# Patient Record
Sex: Female | Born: 1955 | Race: Black or African American | Hispanic: No | Marital: Single | Smoking: Current some day smoker
Health system: Southern US, Community
[De-identification: ages and names within clinical notes are randomized; demographics above are authoritative.]

## PROBLEM LIST (undated history)

## (undated) DIAGNOSIS — Z5189 Encounter for other specified aftercare: Secondary | ICD-10-CM

## (undated) DIAGNOSIS — F329 Major depressive disorder, single episode, unspecified: Secondary | ICD-10-CM

## (undated) DIAGNOSIS — I10 Essential (primary) hypertension: Secondary | ICD-10-CM

## (undated) DIAGNOSIS — E785 Hyperlipidemia, unspecified: Secondary | ICD-10-CM

## (undated) DIAGNOSIS — N189 Chronic kidney disease, unspecified: Secondary | ICD-10-CM

## (undated) DIAGNOSIS — E119 Type 2 diabetes mellitus without complications: Secondary | ICD-10-CM

## (undated) DIAGNOSIS — F419 Anxiety disorder, unspecified: Secondary | ICD-10-CM

## (undated) DIAGNOSIS — F32A Depression, unspecified: Secondary | ICD-10-CM

## (undated) DIAGNOSIS — T7840XA Allergy, unspecified, initial encounter: Secondary | ICD-10-CM

## (undated) DIAGNOSIS — J45909 Unspecified asthma, uncomplicated: Secondary | ICD-10-CM

## (undated) DIAGNOSIS — D509 Iron deficiency anemia, unspecified: Secondary | ICD-10-CM

## (undated) DIAGNOSIS — I639 Cerebral infarction, unspecified: Secondary | ICD-10-CM

## (undated) DIAGNOSIS — J449 Chronic obstructive pulmonary disease, unspecified: Secondary | ICD-10-CM

## (undated) DIAGNOSIS — E559 Vitamin D deficiency, unspecified: Secondary | ICD-10-CM

## (undated) HISTORY — DX: Iron deficiency anemia, unspecified: D50.9

## (undated) HISTORY — DX: Hyperlipidemia, unspecified: E78.5

## (undated) HISTORY — DX: Major depressive disorder, single episode, unspecified: F32.9

## (undated) HISTORY — DX: Anxiety disorder, unspecified: F41.9

## (undated) HISTORY — DX: Cerebral infarction, unspecified: I63.9

## (undated) HISTORY — DX: Vitamin D deficiency, unspecified: E55.9

## (undated) HISTORY — DX: Allergy, unspecified, initial encounter: T78.40XA

## (undated) HISTORY — DX: Unspecified asthma, uncomplicated: J45.909

## (undated) HISTORY — PX: APPENDECTOMY: SHX54

## (undated) HISTORY — DX: Chronic obstructive pulmonary disease, unspecified: J44.9

## (undated) HISTORY — DX: Depression, unspecified: F32.A

## (undated) HISTORY — PX: TUBAL LIGATION: SHX77

## (undated) HISTORY — PX: ABDOMINAL HYSTERECTOMY: SHX81

## (undated) HISTORY — DX: Chronic kidney disease, unspecified: N18.9

## (undated) HISTORY — DX: Type 2 diabetes mellitus without complications: E11.9

## (undated) HISTORY — DX: Essential (primary) hypertension: I10

## (undated) HISTORY — DX: Encounter for other specified aftercare: Z51.89

---

## 1998-01-24 ENCOUNTER — Emergency Department (HOSPITAL_COMMUNITY): Admission: EM | Admit: 1998-01-24 | Discharge: 1998-01-24 | Payer: Self-pay | Admitting: Emergency Medicine

## 1998-08-18 ENCOUNTER — Other Ambulatory Visit: Admission: RE | Admit: 1998-08-18 | Discharge: 1998-08-18 | Payer: Self-pay | Admitting: Family Medicine

## 1998-08-18 DIAGNOSIS — N63 Unspecified lump in unspecified breast: Secondary | ICD-10-CM | POA: Insufficient documentation

## 1998-11-23 ENCOUNTER — Emergency Department (HOSPITAL_COMMUNITY): Admission: EM | Admit: 1998-11-23 | Discharge: 1998-11-24 | Payer: Self-pay | Admitting: *Deleted

## 1999-01-11 ENCOUNTER — Ambulatory Visit (HOSPITAL_COMMUNITY): Admission: RE | Admit: 1999-01-11 | Discharge: 1999-01-11 | Payer: Self-pay | Admitting: Family Medicine

## 1999-01-11 ENCOUNTER — Encounter: Payer: Self-pay | Admitting: Family Medicine

## 1999-02-08 ENCOUNTER — Inpatient Hospital Stay (HOSPITAL_COMMUNITY): Admission: RE | Admit: 1999-02-08 | Discharge: 1999-02-11 | Payer: Self-pay | Admitting: Obstetrics and Gynecology

## 1999-02-08 ENCOUNTER — Encounter (INDEPENDENT_AMBULATORY_CARE_PROVIDER_SITE_OTHER): Payer: Self-pay | Admitting: *Deleted

## 1999-03-31 ENCOUNTER — Emergency Department (HOSPITAL_COMMUNITY): Admission: EM | Admit: 1999-03-31 | Discharge: 1999-04-01 | Payer: Self-pay | Admitting: *Deleted

## 1999-04-01 ENCOUNTER — Encounter: Payer: Self-pay | Admitting: Emergency Medicine

## 1999-09-21 ENCOUNTER — Ambulatory Visit (HOSPITAL_COMMUNITY): Admission: RE | Admit: 1999-09-21 | Discharge: 1999-09-21 | Payer: Self-pay | Admitting: *Deleted

## 1999-11-09 ENCOUNTER — Ambulatory Visit (HOSPITAL_BASED_OUTPATIENT_CLINIC_OR_DEPARTMENT_OTHER): Admission: RE | Admit: 1999-11-09 | Discharge: 1999-11-09 | Payer: Self-pay | Admitting: Family Medicine

## 1999-11-09 DIAGNOSIS — G4730 Sleep apnea, unspecified: Secondary | ICD-10-CM

## 1999-11-09 DIAGNOSIS — G4710 Hypersomnia, unspecified: Secondary | ICD-10-CM | POA: Insufficient documentation

## 1999-12-07 ENCOUNTER — Emergency Department (HOSPITAL_COMMUNITY): Admission: EM | Admit: 1999-12-07 | Discharge: 1999-12-07 | Payer: Self-pay | Admitting: Emergency Medicine

## 1999-12-14 ENCOUNTER — Ambulatory Visit (HOSPITAL_COMMUNITY): Admission: RE | Admit: 1999-12-14 | Discharge: 1999-12-14 | Payer: Self-pay | Admitting: Family Medicine

## 1999-12-14 ENCOUNTER — Encounter: Payer: Self-pay | Admitting: Family Medicine

## 2000-01-18 ENCOUNTER — Other Ambulatory Visit: Admission: RE | Admit: 2000-01-18 | Discharge: 2000-01-18 | Payer: Self-pay | Admitting: Obstetrics and Gynecology

## 2000-01-20 ENCOUNTER — Encounter: Payer: Self-pay | Admitting: Obstetrics and Gynecology

## 2000-01-20 ENCOUNTER — Ambulatory Visit (HOSPITAL_COMMUNITY): Admission: RE | Admit: 2000-01-20 | Discharge: 2000-01-20 | Payer: Self-pay | Admitting: Obstetrics and Gynecology

## 2000-03-29 ENCOUNTER — Ambulatory Visit (HOSPITAL_BASED_OUTPATIENT_CLINIC_OR_DEPARTMENT_OTHER): Admission: RE | Admit: 2000-03-29 | Discharge: 2000-03-29 | Payer: Self-pay | Admitting: Family Medicine

## 2001-01-17 ENCOUNTER — Encounter: Payer: Self-pay | Admitting: Family Medicine

## 2001-01-17 ENCOUNTER — Ambulatory Visit (HOSPITAL_COMMUNITY): Admission: RE | Admit: 2001-01-17 | Discharge: 2001-01-17 | Payer: Self-pay | Admitting: Family Medicine

## 2001-07-09 ENCOUNTER — Ambulatory Visit (HOSPITAL_BASED_OUTPATIENT_CLINIC_OR_DEPARTMENT_OTHER): Admission: RE | Admit: 2001-07-09 | Discharge: 2001-07-09 | Payer: Self-pay | Admitting: Pulmonary Disease

## 2001-10-25 ENCOUNTER — Encounter (INDEPENDENT_AMBULATORY_CARE_PROVIDER_SITE_OTHER): Payer: Self-pay | Admitting: Family Medicine

## 2001-10-25 LAB — CONVERTED CEMR LAB

## 2002-08-07 ENCOUNTER — Ambulatory Visit (HOSPITAL_COMMUNITY): Admission: RE | Admit: 2002-08-07 | Discharge: 2002-08-07 | Payer: Self-pay | Admitting: Family Medicine

## 2003-08-08 ENCOUNTER — Ambulatory Visit (HOSPITAL_COMMUNITY): Admission: RE | Admit: 2003-08-08 | Discharge: 2003-08-08 | Payer: Self-pay | Admitting: Family Medicine

## 2004-03-15 ENCOUNTER — Ambulatory Visit: Payer: Self-pay | Admitting: Family Medicine

## 2004-04-20 ENCOUNTER — Ambulatory Visit: Payer: Self-pay | Admitting: Family Medicine

## 2004-05-10 ENCOUNTER — Ambulatory Visit: Payer: Self-pay | Admitting: Family Medicine

## 2004-08-21 DIAGNOSIS — R279 Unspecified lack of coordination: Secondary | ICD-10-CM

## 2004-08-25 ENCOUNTER — Ambulatory Visit: Payer: Self-pay | Admitting: Family Medicine

## 2004-09-14 ENCOUNTER — Ambulatory Visit (HOSPITAL_COMMUNITY): Admission: RE | Admit: 2004-09-14 | Discharge: 2004-09-14 | Payer: Self-pay | Admitting: Family Medicine

## 2004-09-28 ENCOUNTER — Ambulatory Visit: Payer: Self-pay | Admitting: Family Medicine

## 2004-10-06 ENCOUNTER — Ambulatory Visit (HOSPITAL_COMMUNITY): Admission: RE | Admit: 2004-10-06 | Discharge: 2004-10-06 | Payer: Self-pay | Admitting: Family Medicine

## 2004-10-06 DIAGNOSIS — I6350 Cerebral infarction due to unspecified occlusion or stenosis of unspecified cerebral artery: Secondary | ICD-10-CM | POA: Insufficient documentation

## 2004-10-20 ENCOUNTER — Ambulatory Visit: Payer: Self-pay | Admitting: Family Medicine

## 2004-10-20 DIAGNOSIS — N393 Stress incontinence (female) (male): Secondary | ICD-10-CM | POA: Insufficient documentation

## 2004-12-13 ENCOUNTER — Encounter: Admission: RE | Admit: 2004-12-13 | Discharge: 2005-03-13 | Payer: Self-pay | Admitting: Neurology

## 2004-12-21 ENCOUNTER — Ambulatory Visit: Payer: Self-pay | Admitting: Family Medicine

## 2004-12-21 DIAGNOSIS — E785 Hyperlipidemia, unspecified: Secondary | ICD-10-CM | POA: Insufficient documentation

## 2005-03-17 ENCOUNTER — Ambulatory Visit: Payer: Self-pay | Admitting: Family Medicine

## 2005-05-25 ENCOUNTER — Ambulatory Visit: Payer: Self-pay | Admitting: Family Medicine

## 2005-10-24 ENCOUNTER — Ambulatory Visit: Payer: Self-pay | Admitting: Family Medicine

## 2005-11-01 ENCOUNTER — Ambulatory Visit (HOSPITAL_COMMUNITY): Admission: RE | Admit: 2005-11-01 | Discharge: 2005-11-01 | Payer: Self-pay | Admitting: Internal Medicine

## 2006-01-17 ENCOUNTER — Emergency Department (HOSPITAL_COMMUNITY): Admission: EM | Admit: 2006-01-17 | Discharge: 2006-01-17 | Payer: Self-pay | Admitting: Emergency Medicine

## 2006-01-24 ENCOUNTER — Ambulatory Visit: Payer: Self-pay | Admitting: Internal Medicine

## 2006-02-27 ENCOUNTER — Ambulatory Visit: Payer: Self-pay | Admitting: Family Medicine

## 2006-05-08 ENCOUNTER — Ambulatory Visit: Payer: Self-pay | Admitting: Family Medicine

## 2006-05-08 LAB — CONVERTED CEMR LAB
HDL: 43 mg/dL
Microalbumin U total vol: 0.51 mg/L
Triglycerides: 82 mg/dL

## 2006-05-29 ENCOUNTER — Ambulatory Visit: Payer: Self-pay | Admitting: Family Medicine

## 2006-11-02 ENCOUNTER — Encounter (INDEPENDENT_AMBULATORY_CARE_PROVIDER_SITE_OTHER): Payer: Self-pay | Admitting: Family Medicine

## 2006-11-02 DIAGNOSIS — L732 Hidradenitis suppurativa: Secondary | ICD-10-CM | POA: Insufficient documentation

## 2006-11-02 DIAGNOSIS — F17200 Nicotine dependence, unspecified, uncomplicated: Secondary | ICD-10-CM | POA: Insufficient documentation

## 2006-11-02 DIAGNOSIS — F70 Mild intellectual disabilities: Secondary | ICD-10-CM

## 2006-11-02 DIAGNOSIS — F200 Paranoid schizophrenia: Secondary | ICD-10-CM | POA: Insufficient documentation

## 2006-11-02 DIAGNOSIS — E119 Type 2 diabetes mellitus without complications: Secondary | ICD-10-CM | POA: Insufficient documentation

## 2006-11-02 DIAGNOSIS — J45909 Unspecified asthma, uncomplicated: Secondary | ICD-10-CM | POA: Insufficient documentation

## 2006-11-02 HISTORY — DX: Mild intellectual disabilities: F70

## 2006-11-22 ENCOUNTER — Ambulatory Visit (HOSPITAL_COMMUNITY): Admission: RE | Admit: 2006-11-22 | Discharge: 2006-11-22 | Payer: Self-pay | Admitting: Family Medicine

## 2006-11-30 ENCOUNTER — Encounter: Admission: RE | Admit: 2006-11-30 | Discharge: 2006-11-30 | Payer: Self-pay | Admitting: Family Medicine

## 2007-01-03 ENCOUNTER — Ambulatory Visit: Payer: Self-pay | Admitting: Family Medicine

## 2007-01-05 ENCOUNTER — Ambulatory Visit: Payer: Self-pay | Admitting: Family Medicine

## 2007-01-30 ENCOUNTER — Telehealth (INDEPENDENT_AMBULATORY_CARE_PROVIDER_SITE_OTHER): Payer: Self-pay | Admitting: Family Medicine

## 2007-02-21 ENCOUNTER — Encounter (INDEPENDENT_AMBULATORY_CARE_PROVIDER_SITE_OTHER): Payer: Self-pay | Admitting: Family Medicine

## 2007-02-26 ENCOUNTER — Telehealth (INDEPENDENT_AMBULATORY_CARE_PROVIDER_SITE_OTHER): Payer: Self-pay | Admitting: Family Medicine

## 2007-03-15 ENCOUNTER — Encounter (INDEPENDENT_AMBULATORY_CARE_PROVIDER_SITE_OTHER): Payer: Self-pay | Admitting: Family Medicine

## 2007-03-19 ENCOUNTER — Telehealth (INDEPENDENT_AMBULATORY_CARE_PROVIDER_SITE_OTHER): Payer: Self-pay | Admitting: Family Medicine

## 2007-05-02 ENCOUNTER — Emergency Department (HOSPITAL_COMMUNITY): Admission: EM | Admit: 2007-05-02 | Discharge: 2007-05-02 | Payer: Self-pay | Admitting: Family Medicine

## 2007-05-11 ENCOUNTER — Encounter (INDEPENDENT_AMBULATORY_CARE_PROVIDER_SITE_OTHER): Payer: Self-pay | Admitting: Family Medicine

## 2007-05-15 ENCOUNTER — Telehealth (INDEPENDENT_AMBULATORY_CARE_PROVIDER_SITE_OTHER): Payer: Self-pay | Admitting: Family Medicine

## 2007-06-19 ENCOUNTER — Telehealth (INDEPENDENT_AMBULATORY_CARE_PROVIDER_SITE_OTHER): Payer: Self-pay | Admitting: Family Medicine

## 2007-07-09 ENCOUNTER — Ambulatory Visit: Payer: Self-pay | Admitting: Family Medicine

## 2007-07-09 LAB — CONVERTED CEMR LAB
ALT: 11 units/L (ref 0–35)
AST: 13 units/L (ref 0–37)
Basophils Absolute: 0 10*3/uL (ref 0.0–0.1)
Calcium: 9.4 mg/dL (ref 8.4–10.5)
Chloride: 101 meq/L (ref 96–112)
Creatinine, Ser: 0.66 mg/dL (ref 0.40–1.20)
Eosinophils Relative: 1 % (ref 0–5)
Glucose, Bld: 134 mg/dL — ABNORMAL HIGH (ref 70–99)
HCT: 37.6 % (ref 36.0–46.0)
HDL: 41 mg/dL (ref >39)
Hemoglobin: 12.4 g/dL (ref 12.0–15.0)
LDL Cholesterol: 80 mg/dL (ref 0–99)
Lymphocytes Relative: 28 % (ref 12–46)
Monocytes Absolute: 1 10*3/uL (ref 0.1–1.0)
Platelets: 314 10*3/uL (ref 150–400)
Potassium: 4.1 meq/L (ref 3.5–5.3)
RBC: 3.99 M/uL (ref 3.87–5.11)
Sodium: 139 meq/L (ref 135–145)
TSH: 0.453 microintl units/mL (ref 0.350–5.50)
Total CHOL/HDL Ratio: 3.3
Triglycerides: 78 mg/dL (ref <150)
WBC: 8.6 10*3/uL (ref 4.0–10.5)

## 2007-08-01 ENCOUNTER — Telehealth (INDEPENDENT_AMBULATORY_CARE_PROVIDER_SITE_OTHER): Payer: Self-pay | Admitting: Family Medicine

## 2007-08-02 ENCOUNTER — Encounter (INDEPENDENT_AMBULATORY_CARE_PROVIDER_SITE_OTHER): Payer: Self-pay | Admitting: Family Medicine

## 2007-08-02 ENCOUNTER — Telehealth (INDEPENDENT_AMBULATORY_CARE_PROVIDER_SITE_OTHER): Payer: Self-pay | Admitting: *Deleted

## 2007-08-07 ENCOUNTER — Ambulatory Visit: Payer: Self-pay | Admitting: Family Medicine

## 2007-08-09 ENCOUNTER — Ambulatory Visit: Payer: Self-pay | Admitting: Family Medicine

## 2007-08-16 ENCOUNTER — Telehealth (INDEPENDENT_AMBULATORY_CARE_PROVIDER_SITE_OTHER): Payer: Self-pay | Admitting: *Deleted

## 2007-08-27 ENCOUNTER — Ambulatory Visit: Payer: Self-pay | Admitting: Family Medicine

## 2007-08-27 LAB — CONVERTED CEMR LAB: Blood Glucose, Fingerstick: 138

## 2007-09-11 ENCOUNTER — Ambulatory Visit: Payer: Self-pay | Admitting: Family Medicine

## 2007-09-12 ENCOUNTER — Encounter (INDEPENDENT_AMBULATORY_CARE_PROVIDER_SITE_OTHER): Payer: Self-pay | Admitting: Family Medicine

## 2007-09-14 ENCOUNTER — Telehealth (INDEPENDENT_AMBULATORY_CARE_PROVIDER_SITE_OTHER): Payer: Self-pay | Admitting: Nurse Practitioner

## 2007-09-14 ENCOUNTER — Ambulatory Visit: Payer: Self-pay | Admitting: Family Medicine

## 2007-09-20 ENCOUNTER — Telehealth (INDEPENDENT_AMBULATORY_CARE_PROVIDER_SITE_OTHER): Payer: Self-pay | Admitting: Family Medicine

## 2007-09-26 ENCOUNTER — Encounter (INDEPENDENT_AMBULATORY_CARE_PROVIDER_SITE_OTHER): Payer: Self-pay | Admitting: Family Medicine

## 2007-10-03 ENCOUNTER — Encounter (INDEPENDENT_AMBULATORY_CARE_PROVIDER_SITE_OTHER): Payer: Self-pay | Admitting: Family Medicine

## 2007-12-06 ENCOUNTER — Encounter (INDEPENDENT_AMBULATORY_CARE_PROVIDER_SITE_OTHER): Payer: Self-pay | Admitting: Internal Medicine

## 2007-12-25 ENCOUNTER — Ambulatory Visit (HOSPITAL_COMMUNITY): Admission: RE | Admit: 2007-12-25 | Discharge: 2007-12-25 | Payer: Self-pay | Admitting: Family Medicine

## 2008-01-17 ENCOUNTER — Encounter (INDEPENDENT_AMBULATORY_CARE_PROVIDER_SITE_OTHER): Payer: Self-pay | Admitting: Family Medicine

## 2008-02-07 ENCOUNTER — Telehealth (INDEPENDENT_AMBULATORY_CARE_PROVIDER_SITE_OTHER): Payer: Self-pay | Admitting: Family Medicine

## 2008-02-13 ENCOUNTER — Encounter (INDEPENDENT_AMBULATORY_CARE_PROVIDER_SITE_OTHER): Payer: Self-pay | Admitting: Family Medicine

## 2008-04-04 ENCOUNTER — Telehealth (INDEPENDENT_AMBULATORY_CARE_PROVIDER_SITE_OTHER): Payer: Self-pay | Admitting: Internal Medicine

## 2008-05-18 ENCOUNTER — Telehealth (INDEPENDENT_AMBULATORY_CARE_PROVIDER_SITE_OTHER): Payer: Self-pay | Admitting: Internal Medicine

## 2008-05-23 ENCOUNTER — Encounter (INDEPENDENT_AMBULATORY_CARE_PROVIDER_SITE_OTHER): Payer: Self-pay | Admitting: Family Medicine

## 2008-05-23 ENCOUNTER — Telehealth (INDEPENDENT_AMBULATORY_CARE_PROVIDER_SITE_OTHER): Payer: Self-pay | Admitting: Family Medicine

## 2008-06-13 ENCOUNTER — Encounter (INDEPENDENT_AMBULATORY_CARE_PROVIDER_SITE_OTHER): Payer: Self-pay | Admitting: Family Medicine

## 2008-07-15 ENCOUNTER — Telehealth (INDEPENDENT_AMBULATORY_CARE_PROVIDER_SITE_OTHER): Payer: Self-pay | Admitting: Family Medicine

## 2008-09-09 ENCOUNTER — Ambulatory Visit: Payer: Self-pay | Admitting: Family Medicine

## 2008-09-09 LAB — CONVERTED CEMR LAB
Bilirubin Urine: NEGATIVE
Blood in Urine, dipstick: NEGATIVE
Hgb A1c MFr Bld: 6 %
Nitrite: NEGATIVE
Specific Gravity, Urine: 1.015
WBC Urine, dipstick: NEGATIVE
pH: 5.5

## 2008-09-17 ENCOUNTER — Encounter (INDEPENDENT_AMBULATORY_CARE_PROVIDER_SITE_OTHER): Payer: Self-pay | Admitting: Family Medicine

## 2008-09-18 ENCOUNTER — Encounter (INDEPENDENT_AMBULATORY_CARE_PROVIDER_SITE_OTHER): Payer: Self-pay | Admitting: Family Medicine

## 2008-09-26 DIAGNOSIS — I10 Essential (primary) hypertension: Secondary | ICD-10-CM | POA: Insufficient documentation

## 2008-09-30 ENCOUNTER — Ambulatory Visit: Payer: Self-pay | Admitting: Family Medicine

## 2008-09-30 LAB — CONVERTED CEMR LAB
Albumin: 3.9 g/dL (ref 3.5–5.2)
Alkaline Phosphatase: 58 units/L (ref 39–117)
BUN: 14 mg/dL (ref 6–23)
Basophils Absolute: 0 10*3/uL (ref 0.0–0.1)
Basophils Relative: 0 % (ref 0–1)
Calcium: 9.4 mg/dL (ref 8.4–10.5)
Creatinine, Ser: 0.66 mg/dL (ref 0.40–1.20)
Glucose, Bld: 99 mg/dL (ref 70–99)
HDL: 47 mg/dL (ref >39)
LDL Cholesterol: 80 mg/dL (ref 0–99)
Lymphs Abs: 2.4 10*3/uL (ref 0.7–4.0)
MCHC: 32 g/dL (ref 30.0–36.0)
Monocytes Absolute: 0.8 10*3/uL (ref 0.1–1.0)
Neutrophils Relative %: 54 % (ref 43–77)
Potassium: 5 meq/L (ref 3.5–5.3)
RBC: 4 M/uL (ref 3.87–5.11)
RDW: 14.4 % (ref 11.5–15.5)
TSH: 0.781 microintl units/mL (ref 0.350–4.500)
Total CHOL/HDL Ratio: 3.1
Total Protein: 6.5 g/dL (ref 6.0–8.3)
Triglycerides: 90 mg/dL (ref <150)
VLDL: 18 mg/dL (ref 0–40)
WBC: 7.1 10*3/uL (ref 4.0–10.5)

## 2008-10-01 ENCOUNTER — Encounter (INDEPENDENT_AMBULATORY_CARE_PROVIDER_SITE_OTHER): Payer: Self-pay | Admitting: Family Medicine

## 2008-10-06 ENCOUNTER — Encounter (INDEPENDENT_AMBULATORY_CARE_PROVIDER_SITE_OTHER): Payer: Self-pay | Admitting: Nurse Practitioner

## 2008-10-09 ENCOUNTER — Encounter (INDEPENDENT_AMBULATORY_CARE_PROVIDER_SITE_OTHER): Payer: Self-pay | Admitting: Family Medicine

## 2008-10-15 ENCOUNTER — Encounter: Admission: RE | Admit: 2008-10-15 | Discharge: 2008-10-15 | Payer: Self-pay | Admitting: Neurology

## 2008-10-20 ENCOUNTER — Encounter: Admission: RE | Admit: 2008-10-20 | Discharge: 2008-12-08 | Payer: Self-pay | Admitting: Neurology

## 2008-10-22 ENCOUNTER — Encounter: Admission: RE | Admit: 2008-10-22 | Discharge: 2008-10-22 | Payer: Self-pay | Admitting: Orthopedic Surgery

## 2008-10-22 ENCOUNTER — Telehealth (INDEPENDENT_AMBULATORY_CARE_PROVIDER_SITE_OTHER): Payer: Self-pay | Admitting: Family Medicine

## 2008-11-28 ENCOUNTER — Telehealth (INDEPENDENT_AMBULATORY_CARE_PROVIDER_SITE_OTHER): Payer: Self-pay | Admitting: Internal Medicine

## 2008-12-02 ENCOUNTER — Encounter (INDEPENDENT_AMBULATORY_CARE_PROVIDER_SITE_OTHER): Payer: Self-pay | Admitting: Internal Medicine

## 2008-12-04 ENCOUNTER — Encounter (INDEPENDENT_AMBULATORY_CARE_PROVIDER_SITE_OTHER): Payer: Self-pay | Admitting: Family Medicine

## 2008-12-19 ENCOUNTER — Ambulatory Visit: Payer: Self-pay | Admitting: Physician Assistant

## 2008-12-19 DIAGNOSIS — W57XXXA Bitten or stung by nonvenomous insect and other nonvenomous arthropods, initial encounter: Secondary | ICD-10-CM | POA: Insufficient documentation

## 2008-12-19 DIAGNOSIS — T148 Other injury of unspecified body region: Secondary | ICD-10-CM

## 2008-12-22 ENCOUNTER — Ambulatory Visit: Payer: Self-pay | Admitting: Nurse Practitioner

## 2008-12-22 LAB — CONVERTED CEMR LAB: Blood Glucose, Fingerstick: 120

## 2008-12-24 ENCOUNTER — Telehealth (INDEPENDENT_AMBULATORY_CARE_PROVIDER_SITE_OTHER): Payer: Self-pay | Admitting: Nurse Practitioner

## 2008-12-31 ENCOUNTER — Telehealth (INDEPENDENT_AMBULATORY_CARE_PROVIDER_SITE_OTHER): Payer: Self-pay | Admitting: Nurse Practitioner

## 2009-01-06 ENCOUNTER — Telehealth (INDEPENDENT_AMBULATORY_CARE_PROVIDER_SITE_OTHER): Payer: Self-pay | Admitting: Nurse Practitioner

## 2009-01-16 ENCOUNTER — Encounter (INDEPENDENT_AMBULATORY_CARE_PROVIDER_SITE_OTHER): Payer: Self-pay | Admitting: *Deleted

## 2009-01-20 ENCOUNTER — Ambulatory Visit (HOSPITAL_COMMUNITY): Admission: RE | Admit: 2009-01-20 | Discharge: 2009-01-20 | Payer: Self-pay | Admitting: Family Medicine

## 2009-01-23 ENCOUNTER — Encounter (INDEPENDENT_AMBULATORY_CARE_PROVIDER_SITE_OTHER): Payer: Self-pay | Admitting: Nurse Practitioner

## 2009-01-30 ENCOUNTER — Encounter: Admission: RE | Admit: 2009-01-30 | Discharge: 2009-01-30 | Payer: Self-pay | Admitting: Family Medicine

## 2009-02-03 ENCOUNTER — Encounter (INDEPENDENT_AMBULATORY_CARE_PROVIDER_SITE_OTHER): Payer: Self-pay | Admitting: Nurse Practitioner

## 2009-03-30 ENCOUNTER — Telehealth (INDEPENDENT_AMBULATORY_CARE_PROVIDER_SITE_OTHER): Payer: Self-pay | Admitting: *Deleted

## 2009-03-31 ENCOUNTER — Encounter (INDEPENDENT_AMBULATORY_CARE_PROVIDER_SITE_OTHER): Payer: Self-pay | Admitting: Nurse Practitioner

## 2009-05-06 ENCOUNTER — Telehealth (INDEPENDENT_AMBULATORY_CARE_PROVIDER_SITE_OTHER): Payer: Self-pay | Admitting: Nurse Practitioner

## 2009-05-18 ENCOUNTER — Ambulatory Visit: Payer: Self-pay | Admitting: Nurse Practitioner

## 2009-07-28 ENCOUNTER — Telehealth (INDEPENDENT_AMBULATORY_CARE_PROVIDER_SITE_OTHER): Payer: Self-pay | Admitting: Nurse Practitioner

## 2009-08-11 ENCOUNTER — Encounter (INDEPENDENT_AMBULATORY_CARE_PROVIDER_SITE_OTHER): Payer: Self-pay | Admitting: Nurse Practitioner

## 2009-08-25 ENCOUNTER — Telehealth (INDEPENDENT_AMBULATORY_CARE_PROVIDER_SITE_OTHER): Payer: Self-pay | Admitting: Nurse Practitioner

## 2009-09-01 ENCOUNTER — Inpatient Hospital Stay (HOSPITAL_COMMUNITY): Admission: EM | Admit: 2009-09-01 | Discharge: 2009-09-03 | Payer: Self-pay | Admitting: Emergency Medicine

## 2009-09-01 ENCOUNTER — Ambulatory Visit: Payer: Self-pay | Admitting: Cardiovascular Disease

## 2009-09-02 ENCOUNTER — Ambulatory Visit: Payer: Self-pay | Admitting: Vascular Surgery

## 2009-09-02 ENCOUNTER — Encounter (INDEPENDENT_AMBULATORY_CARE_PROVIDER_SITE_OTHER): Payer: Self-pay | Admitting: Internal Medicine

## 2009-09-03 ENCOUNTER — Encounter (INDEPENDENT_AMBULATORY_CARE_PROVIDER_SITE_OTHER): Payer: Self-pay | Admitting: Nurse Practitioner

## 2009-09-09 ENCOUNTER — Other Ambulatory Visit: Admission: RE | Admit: 2009-09-09 | Discharge: 2009-09-09 | Payer: Self-pay | Admitting: Internal Medicine

## 2009-09-09 ENCOUNTER — Telehealth (INDEPENDENT_AMBULATORY_CARE_PROVIDER_SITE_OTHER): Payer: Self-pay | Admitting: Nurse Practitioner

## 2009-09-09 ENCOUNTER — Ambulatory Visit: Payer: Self-pay | Admitting: Nurse Practitioner

## 2009-09-09 DIAGNOSIS — E8941 Symptomatic postprocedural ovarian failure: Secondary | ICD-10-CM | POA: Insufficient documentation

## 2009-09-09 LAB — CONVERTED CEMR LAB
GC Probe Amp, Genital: NEGATIVE
LDL Goal: 70 mg/dL

## 2009-09-10 ENCOUNTER — Ambulatory Visit: Payer: Self-pay | Admitting: Nurse Practitioner

## 2009-09-14 ENCOUNTER — Encounter (INDEPENDENT_AMBULATORY_CARE_PROVIDER_SITE_OTHER): Payer: Self-pay | Admitting: Nurse Practitioner

## 2009-09-14 LAB — CONVERTED CEMR LAB
ALT: 9 units/L (ref 0–35)
CO2: 29 meq/L (ref 19–32)
Calcium: 9.4 mg/dL (ref 8.4–10.5)
Chloride: 101 meq/L (ref 96–112)
Eosinophils Absolute: 0.1 10*3/uL (ref 0.0–0.7)
Hemoglobin: 11.9 g/dL — ABNORMAL LOW (ref 12.0–15.0)
Lymphs Abs: 2.7 10*3/uL (ref 0.7–4.0)
MCV: 92.1 fL (ref 78.0–100.0)
Monocytes Relative: 12 % (ref 3–12)
Neutro Abs: 3 10*3/uL (ref 1.7–7.7)
Neutrophils Relative %: 45 % (ref 43–77)
RBC: 3.91 M/uL (ref 3.87–5.11)
Sodium: 141 meq/L (ref 135–145)
TSH: 0.442 microintl units/mL (ref 0.350–4.500)
Total CHOL/HDL Ratio: 3.3
Total Protein: 6.6 g/dL (ref 6.0–8.3)
VLDL: 17 mg/dL (ref 0–40)
WBC: 6.6 10*3/uL (ref 4.0–10.5)

## 2009-09-15 ENCOUNTER — Encounter (INDEPENDENT_AMBULATORY_CARE_PROVIDER_SITE_OTHER): Payer: Self-pay | Admitting: Nurse Practitioner

## 2009-09-17 ENCOUNTER — Telehealth (INDEPENDENT_AMBULATORY_CARE_PROVIDER_SITE_OTHER): Payer: Self-pay | Admitting: Nurse Practitioner

## 2009-09-18 ENCOUNTER — Telehealth (INDEPENDENT_AMBULATORY_CARE_PROVIDER_SITE_OTHER): Payer: Self-pay | Admitting: Nurse Practitioner

## 2009-09-23 ENCOUNTER — Encounter (INDEPENDENT_AMBULATORY_CARE_PROVIDER_SITE_OTHER): Payer: Self-pay | Admitting: Nurse Practitioner

## 2009-10-01 ENCOUNTER — Telehealth (INDEPENDENT_AMBULATORY_CARE_PROVIDER_SITE_OTHER): Payer: Self-pay | Admitting: Nurse Practitioner

## 2009-10-01 LAB — CONVERTED CEMR LAB
Bilirubin Urine: NEGATIVE
Glucose, Urine, Semiquant: NEGATIVE

## 2009-10-12 ENCOUNTER — Telehealth (INDEPENDENT_AMBULATORY_CARE_PROVIDER_SITE_OTHER): Payer: Self-pay | Admitting: Nurse Practitioner

## 2009-10-20 ENCOUNTER — Encounter (INDEPENDENT_AMBULATORY_CARE_PROVIDER_SITE_OTHER): Payer: Self-pay | Admitting: Nurse Practitioner

## 2009-10-22 ENCOUNTER — Telehealth (INDEPENDENT_AMBULATORY_CARE_PROVIDER_SITE_OTHER): Payer: Self-pay | Admitting: Nurse Practitioner

## 2009-10-22 ENCOUNTER — Emergency Department (HOSPITAL_COMMUNITY): Admission: EM | Admit: 2009-10-22 | Discharge: 2009-10-22 | Payer: Self-pay | Admitting: Emergency Medicine

## 2009-10-30 ENCOUNTER — Encounter (INDEPENDENT_AMBULATORY_CARE_PROVIDER_SITE_OTHER): Payer: Self-pay | Admitting: Nurse Practitioner

## 2009-11-12 ENCOUNTER — Encounter (INDEPENDENT_AMBULATORY_CARE_PROVIDER_SITE_OTHER): Payer: Self-pay | Admitting: Nurse Practitioner

## 2009-11-17 ENCOUNTER — Encounter (INDEPENDENT_AMBULATORY_CARE_PROVIDER_SITE_OTHER): Payer: Self-pay | Admitting: Nurse Practitioner

## 2009-12-18 ENCOUNTER — Encounter (INDEPENDENT_AMBULATORY_CARE_PROVIDER_SITE_OTHER): Payer: Self-pay | Admitting: Nurse Practitioner

## 2009-12-21 ENCOUNTER — Encounter (INDEPENDENT_AMBULATORY_CARE_PROVIDER_SITE_OTHER): Payer: Self-pay | Admitting: Nurse Practitioner

## 2009-12-22 ENCOUNTER — Encounter (INDEPENDENT_AMBULATORY_CARE_PROVIDER_SITE_OTHER): Payer: Self-pay | Admitting: Nurse Practitioner

## 2010-02-02 ENCOUNTER — Telehealth (INDEPENDENT_AMBULATORY_CARE_PROVIDER_SITE_OTHER): Payer: Self-pay | Admitting: Nurse Practitioner

## 2010-03-10 ENCOUNTER — Ambulatory Visit: Payer: Self-pay | Admitting: Nurse Practitioner

## 2010-03-16 ENCOUNTER — Telehealth (INDEPENDENT_AMBULATORY_CARE_PROVIDER_SITE_OTHER): Payer: Self-pay | Admitting: Nurse Practitioner

## 2010-04-09 ENCOUNTER — Telehealth (INDEPENDENT_AMBULATORY_CARE_PROVIDER_SITE_OTHER): Payer: Self-pay | Admitting: Nurse Practitioner

## 2010-04-27 ENCOUNTER — Telehealth (INDEPENDENT_AMBULATORY_CARE_PROVIDER_SITE_OTHER): Payer: Self-pay | Admitting: Nurse Practitioner

## 2010-05-11 IMAGING — CT CT HEAD W/O CM
1 series · 16 of 30 positions shown, 20 images · non-contrast
Comparison: CT dated 01/17/2006 and MR dated 10/15/2008.

CLINICAL DATA: Left arm weakness.  Altered mental status.  Fell
several times today.

CT HEAD WITHOUT CONTRAST
TECHNIQUE: Contiguous axial images were obtained from the base of
the skull through the vertex without contrast.

[Series 2: head_seq 4.5 h37s st · axial · 0.43mm/px · z∈[-148,-22]mm · 16 of 32 slices shown, 20 images]
[im 2/32  brain]
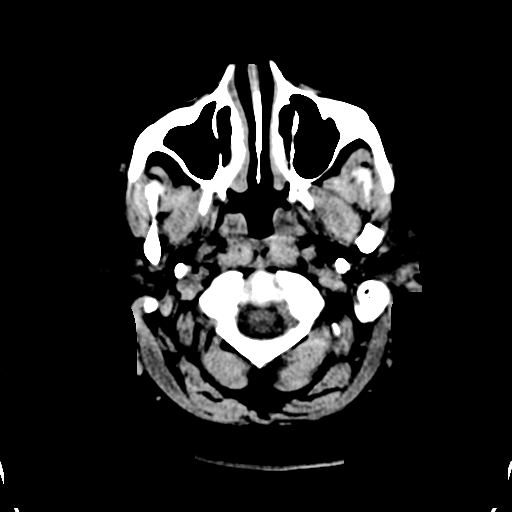
[im 2/32  bone]
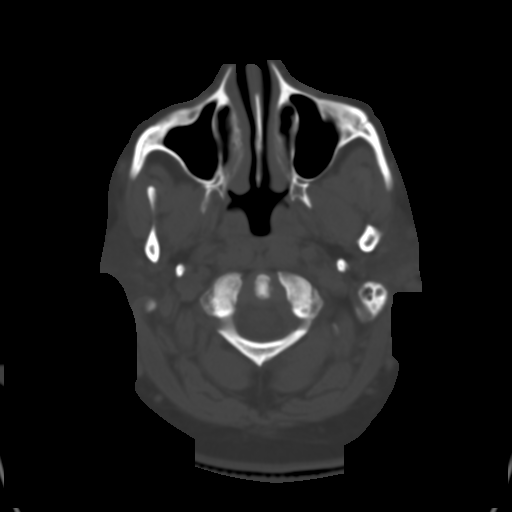
[im 4/32  brain]
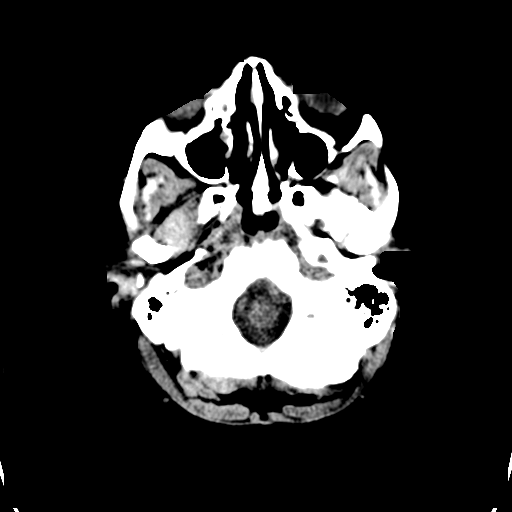
[im 6/32  brain]
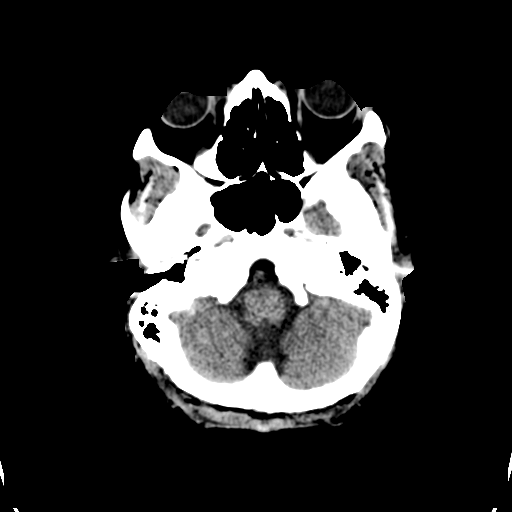
[im 8/32  brain]
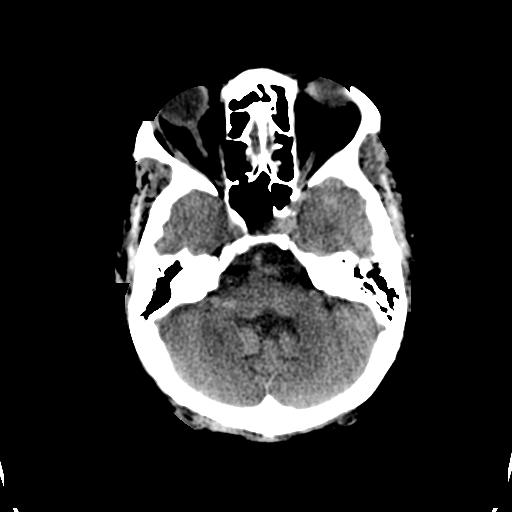
[im 9/32  brain]
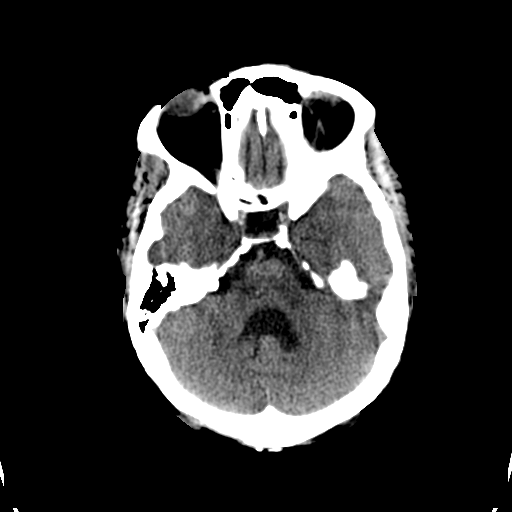
[im 9/32  bone]
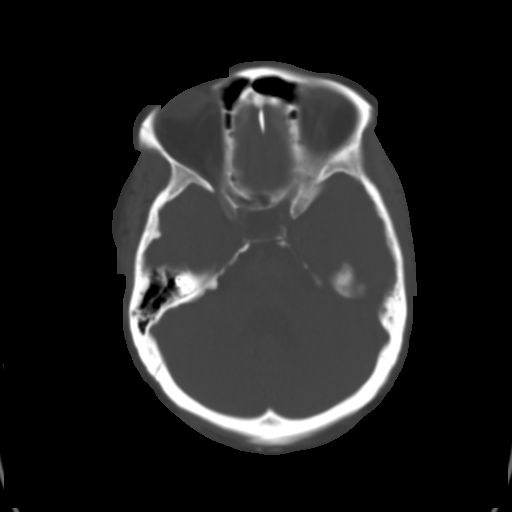
[im 11/32  brain]
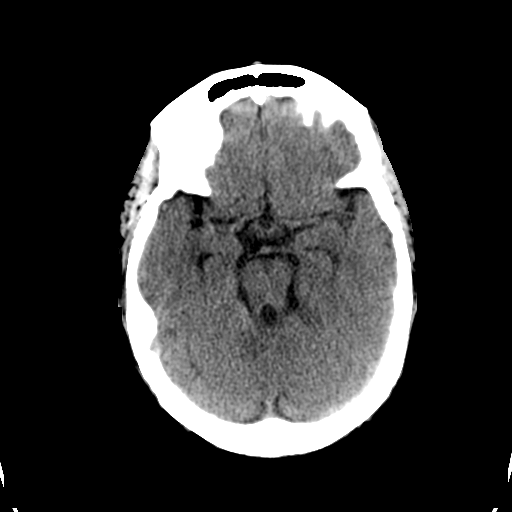
[im 13/32  brain]
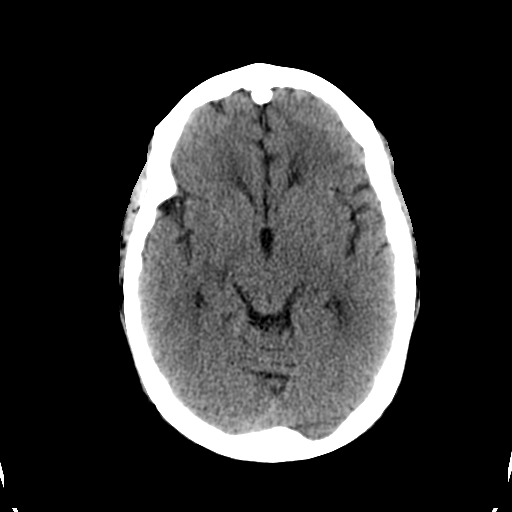
[im 15/32  brain]
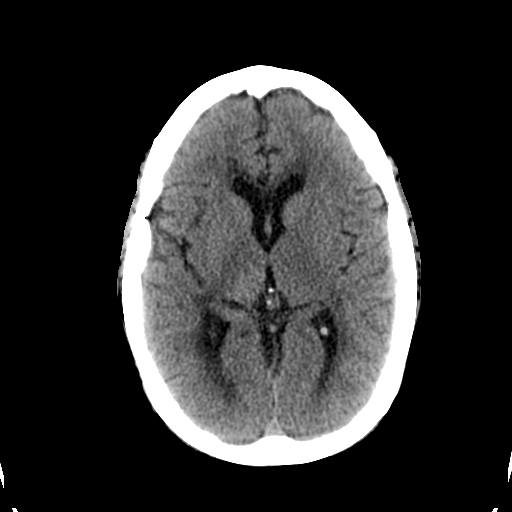
[im 17/32  brain]
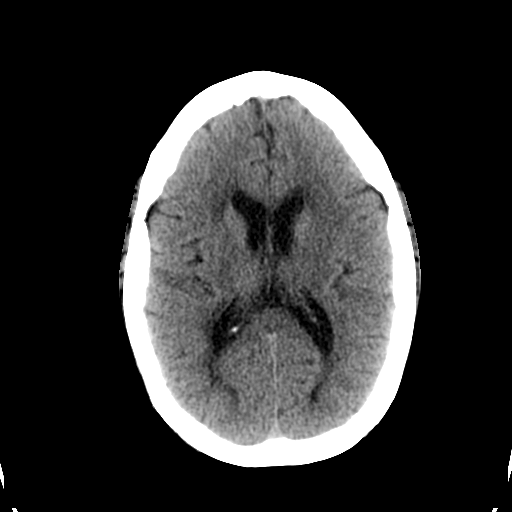
[im 17/32  bone]
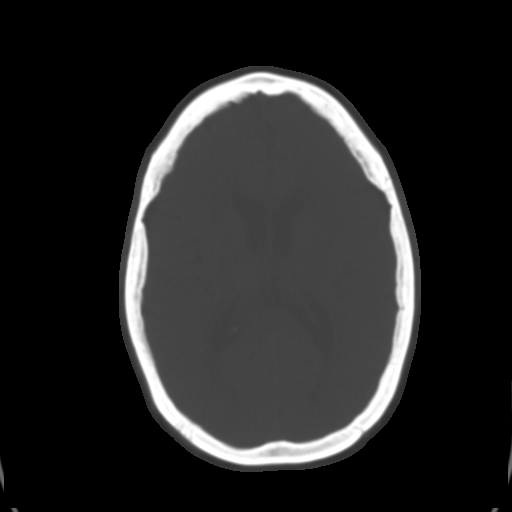
[im 19/32  brain]
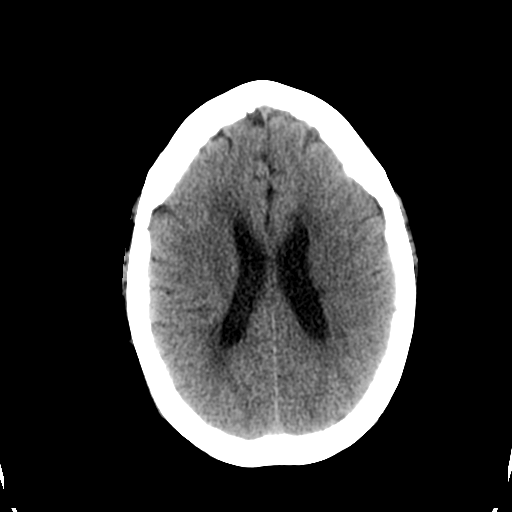
[im 21/32  brain]
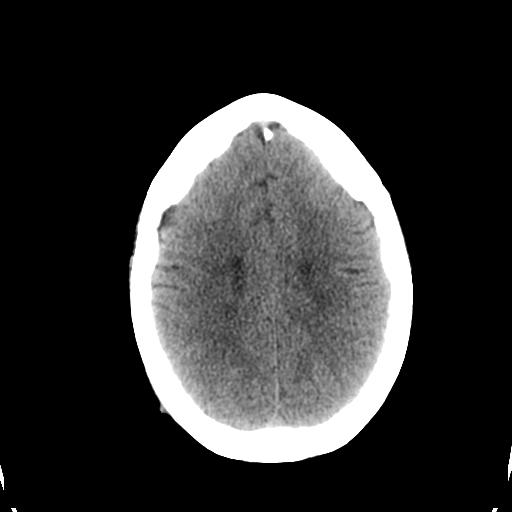
[im 23/32  brain]
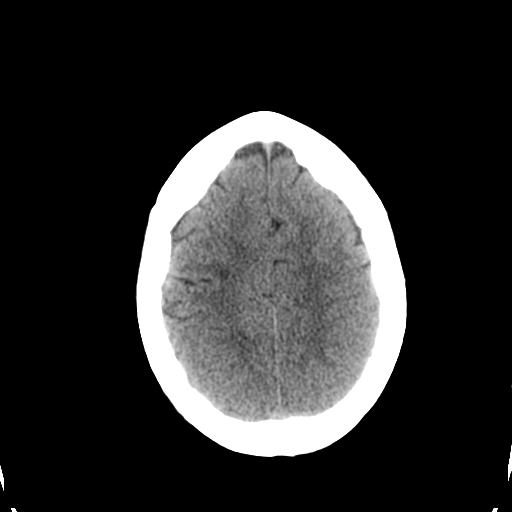
[im 24/32  brain]
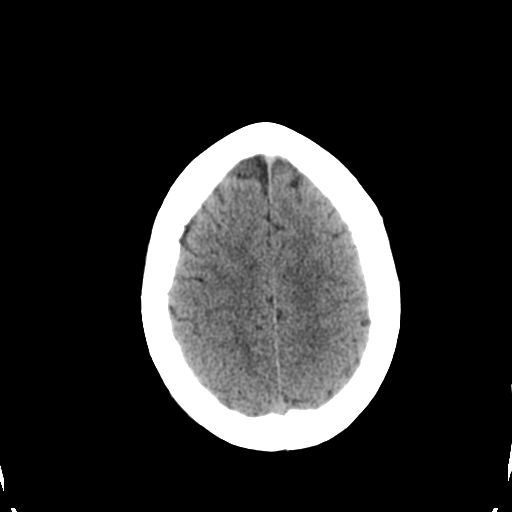
[im 24/32  bone]
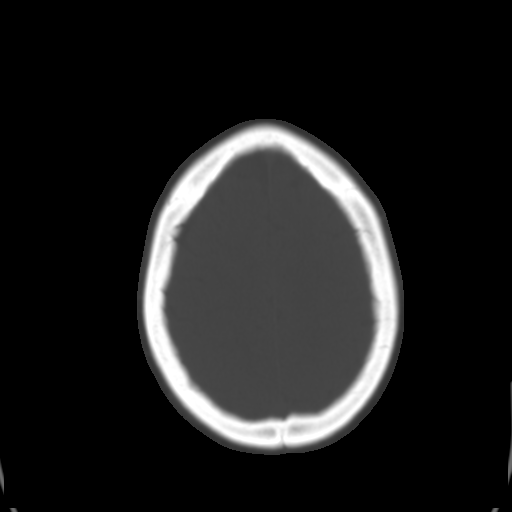
[im 26/32  brain]
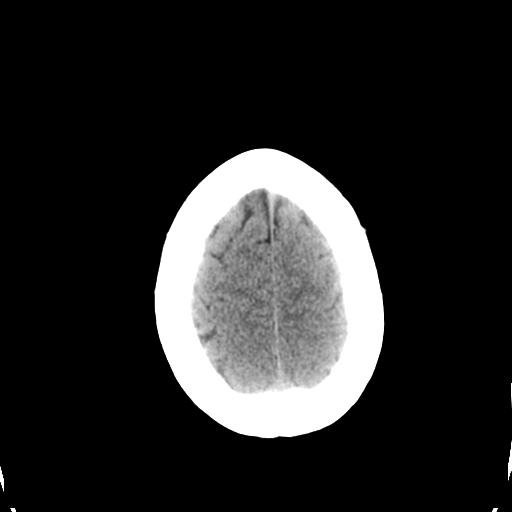
[im 28/32  brain]
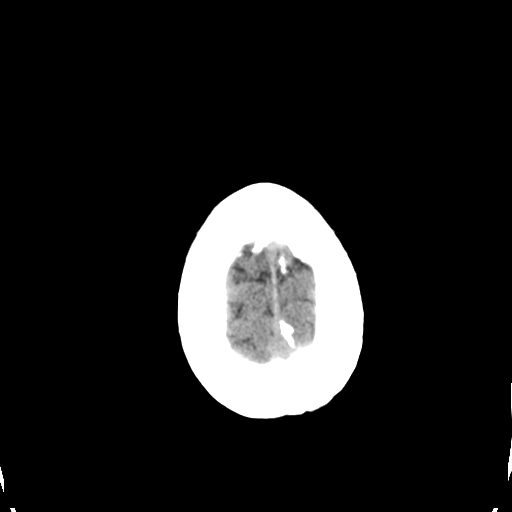
[im 30/32  brain]
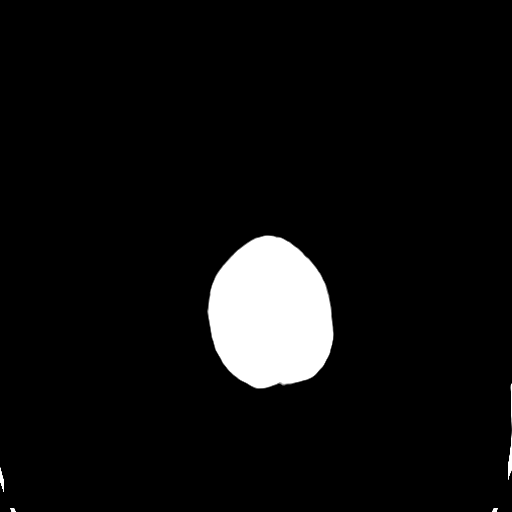

[16 of 30 positions shown; findings below may reference images not displayed]

FINDINGS: Old left midbrain and bilateral pontomesencephalic
lacunar infarcts.  No significant change in patchy white matter low
density in both cerebral hemispheres.  Minimally enlarged
ventricles and subarachnoid spaces.  No skull fracture,
intracranial hemorrhage, mass lesion or CT evidence of acute
infarction.  Unremarkable bones and included paranasal sinuses.
IMPRESSION: 1.  Old brainstem lacunar infarcts and chronic small vessel white
matter ischemic changes in both cerebral hemispheres.
2.  Minimal diffuse cerebral and cerebellar atrophy.
3.  No acute abnormality.

## 2010-05-23 ENCOUNTER — Encounter: Payer: Self-pay | Admitting: Internal Medicine

## 2010-06-01 NOTE — Letter (Signed)
Summary: DURABLE MEDICAL EQUIPMENT  DURABLE MEDICAL EQUIPMENT   Imported By: Arta Bruce 09/23/2009 10:09:31  _____________________________________________________________________  External Attachment:    Type:   Image     Comment:   External Document

## 2010-06-01 NOTE — Assessment & Plan Note (Signed)
Summary: FLU SHOT////KT  Nurse Visit   Allergies: 1)  ! Penicillin 2)  ! * Mellaril  Immunizations Administered:  Influenza Vaccine # 1:    Vaccine Type: Fluvax 3+    Site: right deltoid    Mfr: [employer]    Dose: 0.5 ml    Route: IM    Given by: [place] Shannon    Exp. Date: 10/30/2010    Lot #: [id number]    VIS given: 11/24/09 version given March 10, 2010.  Flu Vaccine Consent Questions:    Do you have a history of severe allergic reactions to this vaccine? no    Any prior history of allergic reactions to egg and/or gelatin? no    Do you have a sensitivity to the preservative Thimersol? no    Do you have a past history of Guillan-Barre Syndrome? no    Do you currently have an acute febrile illness? no    Have you ever had a severe reaction to latex? no    Vaccine information given and explained to patient? yes    Are you currently pregnant? no  Orders Added: 1)  Flu Vaccine 6yrs + [90658] 2)  Admin 1st Vaccine [90471] 3)  Est. Patient Nurse visit [09003]

## 2010-06-01 NOTE — Miscellaneous (Signed)
Summary: Med changes  Clinical Lists Changes  Medications: Removed medication of VESICARE 10 MG  TABS (SOLIFENACIN SUCCINATE) Take 1 tablet by mouth once a day Added new medication of FLONASE 50 MCG/ACT SUSP (FLUTICASONE PROPIONATE) Two sprays in each nostril Added new medication of ENABLEX 15 MG XR24H-TAB (DARIFENACIN HYDROBROMIDE) One tablet by mouth daily Added new medication of FLUPHENAZINE HCL 5 MG TABS (FLUPHENAZINE HCL) ONe tablet by mouth nightly

## 2010-06-01 NOTE — Progress Notes (Signed)
Summary: Contact pt  Phone Note Call from Patient Call back at Home Phone [phone] Call back at [phone]   Summary of Call: Since last two days, the pt has a bad cold (coughing and sneezing constantly) and she wants to make sure if the provider can call in something to the pharmacy.  [facility]) Daphine Deutscher FNP Initial call taken by: Manon Hilding,  May 06, 2009 12:16 PM  Follow-up for Phone Call        schedule an appt. Follow-up by: Mikey College CMA,  May 07, 2009 3:56 PM  Additional Follow-up for Phone Call Additional follow up Details #1::        Yetzali IS SCHED. TO COME IN ON NEXT THURSDAY ON 01/13. Additional Follow-up by: Leodis Rains,  May 08, 2009 3:26 PM

## 2010-06-01 NOTE — Progress Notes (Signed)
Summary: Needs ref to go to Dr. Sandria Manly  Phone Note Call from Patient Call back at Home Phone [phone]   Caller: [facility] at [facility] Reason for Call: Referral Summary of Call: [name] pt. Brenda Riggs called from [facility] and says that a call was made from someone to get Brenda Riggs an appt. to see Dr. Barbaraann Barthel who used to work here. Brenda Riggs says that she got a message back from [place], that Dr. Barbaraann Barthel says that we need to handle getting Brenda Riggs referral and that she is not taking any new medicaid patients.  Well Brenda Riggs says that Brenda Riggs has seen Dr. Sandria Manly her Neurologist last year, so she needs another referral to go back to Dr. Sandria Manly  Initial call taken by: Leodis Rains,  August 25, 2009 5:17 PM  Follow-up for Phone Call        forward to N.Daphine Deutscher, FNP Follow-up by: Levon Hedger,  August 26, 2009 3:12 PM  Additional Follow-up for Phone Call Additional follow up Details #1::        WHAT! Call Brenda Riggs and get clarity on this - why would they call Dr. Barbaraann Barthel - she no longer works her and she knows this. Where they hoping to get pt transferred to [employer] where she works now? She has a hx of CVA - is this why she sees Dr.Love?   Additional Follow-up by: Lehman Prom FNP,  August 26, 2009 3:36 PM    Additional Follow-up for Phone Call Additional follow up Details #2::    PT IS SCHEDULED FOR OV WITH PROVIDER ON 09/10/09 @ 11:30 Follow-up by: Levon Hedger,  August 27, 2009 12:21 PM

## 2010-06-01 NOTE — Letter (Signed)
[facility]: [facility]   Imported By: Arta Bruce 11/13/2009 14:22:29  _____________________________________________________________________  External Attachment:    Type:   Image     Comment:   External Document

## 2010-06-01 NOTE — Assessment & Plan Note (Signed)
Summary: Asthma/HTN   Vital Signs:  Patient profile:   55 year old female Menstrual status:  hysterectomy Height:      59.45 inches Weight:      155.3 pounds BMI:     31.01 Pulse rate:   99 / minute Pulse rhythm:   regular Resp:     18 per minute BP sitting:   175 / 91  (left arm) Cuff size:   regular  Vitals Entered By: Arthor Captain (May 18, 2009 12:07 PM) CC: cough, cold Pain Assessment Patient in pain? yes     Location: chest Intensity: 2 Type: dull Onset of pain  with activity, hurts with coughing CBG Result 195 CBG Device ID b  Does patient need assistance? Functional Status Self care Ambulation Normal Comments requests flu vax   CC:  cough and cold.  History of Present Illness:  Pt into the office for f/u on cough and congestion.  Tobacco use = ongoing for years.  She is now smoking about 1/2 pack to 1 pack per day. Caretaker says is has been an ongoing effort for this pt to quit smoking. Pt is from a rest home and has to go outside to smoke. Cough worse at time caretaker made the appt. it has since improved -fever -change in appetite   Asthma - Pt using Bipap at night Also has a MDI that she uses frequently  Allergies (verified): 1)  ! Penicillin 2)  ! * Mellaril  Review of Systems CV:  Denies chest pain or discomfort. Resp:  Complains of cough and shortness of breath; c/o chest discomfort x 1 day. GI:  Denies abdominal pain, nausea, and vomiting.  Physical Exam  General:  alert.   Head:  normocephalic.   Lungs:  audible breathing pattern but with auscultation no rhonchi decreased bases Heart:  normal rate and regular rhythm.   Msk:  up to the exam table Neurologic:  alert & oriented X3.     Impression & Recommendations:  Problem # 1:  ASTHMA (ICD-493.90)  Her updated medication list for this problem includes:    Ventolin Hfa 108 (90 Base) Mcg/act Aers (Albuterol sulfate) .Marland Kitchen... 2 puffs q4h as needed wheeze    Qvar 40 Mcg/act  Aers (Beclomethasone dipropionate) .Marland Kitchen... 2 puffs inhalation two times a day  Orders: Pulse Oximetry (single measurment) (60454)  Problem # 2:  TOBACCO USER (ICD-305.1)  Advised pt that she needs to quit smoking this will only progress current lung problems  Orders: Pulse Oximetry (single measurment) (94760)  Problem # 3:  ESSENTIAL HYPERTENSION, BENIGN (ICD-401.1) Elevated today most likely will need to adjust meds f/u in 2 weeks for recheck d/c allegra D Her updated medication list for this problem includes:    Hydrochlorothiazide 25 Mg Tabs (Hydrochlorothiazide) .Marland Kitchen... Take 1 tablet by mouth every morning    Lisinopril 20 Mg Tabs (Lisinopril) .Marland Kitchen... 2 tablets by mouth daily for blood pressure  Problem # 4:  NEED PROPHYLACTIC VACCINATION&INOCULATION FLU (ICD-V04.81)  indication: htn/diabetes  Orders: Administration Flu vaccine - MCR (G0008) Flu Vaccine 70yrs + (09811)  Complete Medication List: 1)  Humulin 70/30 70-30 % Susp (Insulin isophane & regular) .Marland Kitchen.. 15 units subcutaneously q am 2)  Glucophage 500 Mg Tabs (Metformin hcl) .... Take 1 tablet two times a day 3)  Minocin 50 Mg Caps (Minocycline hcl) .... Take 1 tablet by mouth two times a day 4)  Hydrochlorothiazide 25 Mg Tabs (Hydrochlorothiazide) .... Take 1 tablet by mouth every morning 5)  Tylenol 325  Mg Tabs (Acetaminophen) .... Take one to two tablets by mouth every four hoursas needed pain 6)  Cetaphil Crea (Emollient) .... To affected areas daily 7)  Prolixin 25mg  Per Ml  .... 2cc im every 2 weeks per dr. Hortencia Pilar 8)  Ativan 2 Mg Tabs (Lorazepam) .... Take 2 tablets po at bedtime 9)  Vesicare 10 Mg Tabs (Solifenacin succinate) .... Take 1 tablet by mouth once a day 10)  Lipitor 10 Mg Tabs (Atorvastatin calcium) .... Take 1 tablet by mouth every pm 11)  Lisinopril 20 Mg Tabs (Lisinopril) .... 2 tablets by mouth daily for blood pressure 12)  Replacement Bipap At 14/10 With Heated Humidity  .... Also with mask of  choice and supplies dx=obstructive sleep apnea fax to [phone] attn:andy 13)  Bedside Commode  .... Use bedside commode for patient safety 14)  Prolixin 5mg   .... Take one tablet by mouth at bedtime (per [facility]) 15)  Bedside Commode  16)  Aspirin Ec 81 Mg Tbec (Aspirin) .... Take 1 tablet by mouth once a day 17)  Ventolin Hfa 108 (90 Base) Mcg/act Aers (Albuterol sulfate) .... 2 puffs q4h as needed wheeze 18)  Flonase 50 Mcg/act Susp (Fluticasone propionate) .... Use 2 squirts to each nostril once daily 19)  Benztropine Mesylate 1 Mg Tabs (Benztropine mesylate) .... Take 1/2 tablet by mouth two times a day .  dr. love and bartlett. 20)  Naprosyn 250 Mg Tabs (Naproxen) .... Take 1 tablet by mouth every 12 hours as needed for pain unrelieved by tylenol 21)  Benadryl 25 Mg Caps (Diphenhydramine hcl) .Marland Kitchen.. 1 tablet by mouth every 6-8 hours as needed for itching 22)  Qvar 40 Mcg/act Aers (Beclomethasone dipropionate) .... 2 puffs inhalation two times a day 23)  Tessalon Perles 100 Mg Caps (Benzonatate) .... One capsule by mouth two times a day as needed for cough  Other Orders: Hemoglobin A1C (83036) Capillary Blood Glucose/CBG (45409)  Patient Instructions: 1)  Blood pressure is high. 2)  Continue blood pressure medication 3)  Discontinue Allegra D if this is on the medication list at the rest home 4)  Breathing - start qvar - 2 puffs two times a day for breathing. 5)  May take albuterol inhaler as needed  6)  You have recieved a flu vaccine today. 7)  Follow up in 2 weeks for a blood pressure check.  Take your medications before this visit. Prescriptions: TESSALON PERLES 100 MG CAPS (BENZONATATE) One capsule by mouth two times a day as needed for cough  #50 x 0   Entered and Authorized by:   Lehman Prom FNP   Signed by:   Lehman Prom FNP on 05/18/2009   Method used:   Print then Give to Patient   RxID:   [id number] QVAR 40 MCG/ACT AERS (BECLOMETHASONE DIPROPIONATE) 2 puffs  inhalation two times a day  #1 month qs x 5   Entered and Authorized by:   Lehman Prom FNP   Signed by:   Lehman Prom FNP on 05/18/2009   Method used:   Print then Give to Patient   RxID:   [id number]   Laboratory Results   Blood Tests   Date/Time Received: May 18, 2009 12:07 PM  Date/Time Reported: May 18, 2009 12:08 PM   HGBA1C: 6.3%   (Normal Range: Non-Diabetic - 3-6%   Control Diabetic - 6-8%) CBG Random:: 195      Influenza Vaccine    Vaccine Type: Fluvax 3+    Mfr: [employer]  Dose: 0.5 ml    Route: IM    Given by: Levon Hedger    Exp. Date: 10/29/2009    Lot #: [id number]    VIS given: 11/23/06 version given May 18, 2009.  Flu Vaccine Consent Questions    Do you have a history of severe allergic reactions to this vaccine? no    Any prior history of allergic reactions to egg and/or gelatin? no    Do you have a sensitivity to the preservative Thimersol? no    Do you have a past history of Guillan-Barre Syndrome? no    Do you currently have an acute febrile illness? no    Have you ever had a severe reaction to latex? no    Vaccine information given and explained to patient? yes    Are you currently pregnant? no  Appended Document: Asthma/HTN     Vital Signs:  Patient profile:   55 year old female Menstrual status:  hysterectomy O2 Sat:      100 % on Room air  O2 Flow:  Room air  Allergies: 1)  ! Penicillin 2)  ! * Mellaril   Complete Medication List: 1)  Humulin 70/30 70-30 % Susp (Insulin isophane & regular) .Marland Kitchen.. 15 units subcutaneously q am 2)  Glucophage 500 Mg Tabs (Metformin hcl) .... Take 1 tablet two times a day 3)  Minocin 50 Mg Caps (Minocycline hcl) .... Take 1 tablet by mouth two times a day 4)  Hydrochlorothiazide 25 Mg Tabs (Hydrochlorothiazide) .... Take 1 tablet by mouth every morning 5)  Tylenol 325 Mg Tabs (Acetaminophen) .... Take one to two tablets by mouth every four hoursas needed pain 6)   Cetaphil Crea (Emollient) .... To affected areas daily 7)  Prolixin 25mg  Per Ml  .... 2cc im every 2 weeks per dr. Hortencia Pilar 8)  Ativan 2 Mg Tabs (Lorazepam) .... Take 2 tablets po at bedtime 9)  Vesicare 10 Mg Tabs (Solifenacin succinate) .... Take 1 tablet by mouth once a day 10)  Lipitor 10 Mg Tabs (Atorvastatin calcium) .... Take 1 tablet by mouth every pm 11)  Lisinopril 20 Mg Tabs (Lisinopril) .... 2 tablets by mouth daily for blood pressure 12)  Replacement Bipap At 14/10 With Heated Humidity  .... Also with mask of choice and supplies dx=obstructive sleep apnea fax to [phone] attn:andy 13)  Bedside Commode  .... Use bedside commode for patient safety 14)  Prolixin 5mg   .... Take one tablet by mouth at bedtime (per [facility]) 15)  Bedside Commode  16)  Aspirin Ec 81 Mg Tbec (Aspirin) .... Take 1 tablet by mouth once a day 17)  Ventolin Hfa 108 (90 Base) Mcg/act Aers (Albuterol sulfate) .... 2 puffs q4h as needed wheeze 18)  Flonase 50 Mcg/act Susp (Fluticasone propionate) .... Use 2 squirts to each nostril once daily 19)  Benztropine Mesylate 1 Mg Tabs (Benztropine mesylate) .... Take 1/2 tablet by mouth two times a day .  dr. love and bartlett. 20)  Naprosyn 250 Mg Tabs (Naproxen) .... Take 1 tablet by mouth every 12 hours as needed for pain unrelieved by tylenol 21)  Benadryl 25 Mg Caps (Diphenhydramine hcl) .Marland Kitchen.. 1 tablet by mouth every 6-8 hours as needed for itching 22)  Qvar 40 Mcg/act Aers (Beclomethasone dipropionate) .... 2 puffs inhalation two times a day 23)  Tessalon Perles 100 Mg Caps (Benzonatate) .... One capsule by mouth two times a day as needed for cough      Vital Signs:  Patient profile:  55 year old female Menstrual status:  hysterectomy O2 Sat:      100 %  O2 Flow:  Room air   Serial Vital Signs/Assessments:                                PEF    PreRx  PostRx Time      O2 Sat  O2 Type     L/min  L/min  L/min   By           100 %   Room air                           Levon Hedger

## 2010-06-01 NOTE — Miscellaneous (Signed)
Summary: [facility]/FAXED  [facility]   Imported By: Arta Bruce 12/22/2009 09:52:27  _____________________________________________________________________  External Attachment:    Type:   Image     Comment:   External Document

## 2010-06-01 NOTE — Miscellaneous (Signed)
Summary: Medication change  Clinical Lists Changes  Medications: Removed medication of FLONASE 50 MCG/ACT SUSP (FLUTICASONE PROPIONATE) Use 2 squirts to each nostril once daily

## 2010-06-01 NOTE — Medication Information (Signed)
Summary: Tax adviser   Imported By: Arta Bruce 12/21/2009 08:48:06  _____________________________________________________________________  External Attachment:    Type:   Image     Comment:   External Document

## 2010-06-01 NOTE — Letter (Signed)
Summary: DRUG REVIEW  DRUG REVIEW   Imported By: Arta Bruce 08/11/2009 09:15:29  _____________________________________________________________________  External Attachment:    Type:   Image     Comment:   External Document

## 2010-06-01 NOTE — Progress Notes (Signed)
Summary: Refill Request [facility])  Phone Note From Pharmacy   Summary of Call: [facility] called in because they just faxed manually a prescription request for the pt above.  The pt needs refills from [facility].   They wanted to know if that can be done today because the pharmacy deliver her medication on Thursday.  If you have any questions, please call at  [phone]  to speak with Meriam Sprague) Daphine Deutscher FNP Initial call taken by: Manon Hilding,  Sep 17, 2009 10:31 AM  Follow-up for Phone Call        pt has been receiving med from urologist. Meriam Sprague contacted she will call his office for refill  Follow-up by: Gaylyn Cheers RN Sep 17, 2009 11:05 PM

## 2010-06-01 NOTE — Progress Notes (Signed)
Summary: Query:  Refill simvastatin?  Phone Note Outgoing Call   Summary of Call: Do you want to refill her simvastatin?  Last seen 08/2009.  Dutch Quint RN  February 02, 2010 3:33 PM   Follow-up for Phone Call        yes, ok to refill Follow-up by: Lehman Prom FNP,  February 03, 2010 8:07 AM  Additional Follow-up for Phone Call Additional follow up Details #1::        Noted.  Refill completed. Additional Follow-up by: Dutch Quint RN,  February 03, 2010 9:32 AM    Prescriptions: SIMVASTATIN 10 MG TABS (SIMVASTATIN) One tablet by mouth nightly for cholesterol **Pharmacy - d/c lipitor**  #30 x 3   Entered by:   Dutch Quint RN   Authorized by:   Lehman Prom FNP   Signed by:   Dutch Quint RN on 02/03/2010   Method used:   Electronically to        [facility]* (retail)       [address]       [place], [place]  [id number]       Ph: [phone]       Fax: [phone]   RxID:   [id number]

## 2010-06-01 NOTE — Assessment & Plan Note (Signed)
Summary: Complete Physical Exam   Vital Signs:  Patient profile:   55 year old female Menstrual status:  hysterectomy Weight:      149.7 pounds BMI:     29.89 BSA:     1.64 O2 Sat:      100 % on Room air Temp:     97.9 degrees F oral Pulse rate:   81 / minute Pulse rhythm:   regular Resp:     20 per minute BP sitting:   157 / 101  (left arm) Cuff size:   regular  Vitals Entered By: Levon Hedger (Sep 09, 2009 8:39 AM)  O2 Flow:  Room air CC: CPP, Hypertension Management, Lipid Management Is Patient Diabetic? Yes Pain Assessment Patient in pain? no      CBG Result 168 CBG Device ID A  Does patient need assistance? Functional Status Self care Ambulation Normal  Vision Screening:      Vision Comments: 01/2010   CC:  CPP, Hypertension Management, and Lipid Management.  History of Present Illness:   Pt into the office for a complete physical exam FL2 form needs completion  Recent hospitalization: 09/01/2009 to 09/03/2009 Small acute subcortical subcortical infarcts and Gait abnormalities Established with Dr. Sandria Manly (neurologist) but it has been over 1 year since her last visit metoprolol added to BP regimen  AHC: difficulty with meals s/p hospitalization.  Recommend swallow evaluation Form in office today with pt.  Tobacco abuse - ongoing although caregiver is trying to decrease the amount  PAP - s/p hysterectomy in 2002.  No PAP since that time.  Eye exam - last done 01/2009. no need for glasses. diabetes not affecting the eye  Dental - 01/07/2009 - time for visit  Podiatry - every 3 months by Dr. Ralene Cork  Hypertension History:      She denies headache, chest pain, and palpitations.  pt is checking blood pressure at least twice per day since hospital discharge 134/70 (sitting), 130/70 (standing).        Positive major cardiovascular risk factors include diabetes, hyperlipidemia, hypertension, and current tobacco user.  Negative major cardiovascular risk  factors include female age less than 15 years old.        Positive history for target organ damage include prior stroke (or TIA).  Further assessment for target organ damage reveals no history of peripheral vascular disease.    Lipid Management History:      Positive NCEP/ATP III risk factors include diabetes, current tobacco user, hypertension, and prior stroke (or TIA).  Negative NCEP/ATP III risk factors include female age less than 46 years old, no peripheral vascular disease, and no history of aortic aneurysm.        Comments include: pt is taking meds.  Comments: pt is NOT fasting today for labs.    Habits & Providers  Alcohol-Tobacco-Diet     Alcohol drinks/day: 0     Tobacco Status: current     Tobacco Counseling: to quit use of tobacco products     Cigarette Packs/Day: <0.25  Exercise-Depression-Behavior     Drug Use: no  Comments: PHQ-9: unable to complete  Diabetic Foot Exam Last Podiatry Exam Date: 01/30/2009  Foot Inspection Is there a history of a foot ulcer?              No Is there a foot ulcer now?              No Can the patient see the bottom of their feet?  Yes Are the shoes appropriate in style and fit?          No Is there swelling or an abnormal foot shape?          No Are the toenails long?                Yes Are the toenails thick?                Yes Are the toenails ingrown?              No Is there heavy callous build-up?              No Is there pain in the calf muscle (Intermittent claudication) when walking?    NoIs there a claw toe deformity?              No Is there elevated skin temperature?            No Is there limited ankle dorsiflexion?            No Is there foot or ankle muscle weakness?            No  Diabetic Foot Care Education Patient educated on appropriate care of diabetic feet.  Pulse Check          Right Foot          Left Foot Dorsalis Pedis:        normal            normal  High Risk Feet? No  Allergies  (verified): 1)  ! Penicillin 2)  ! * Mellaril  Social History: Packs/Day:  <0.25  Review of Systems General:  Denies loss of appetite. Eyes:  Denies discharge. ENT:  Complains of difficulty swallowing; ? coughing when eating and drinking since recent hospitalization. consistancy of foods does not matter. CV:  Denies chest pain or discomfort. Resp:  heavy breathing. GI:  Denies abdominal pain, nausea, and vomiting. GU:  Denies discharge. MS:  Complains of joint pain. Derm:  Denies rash. Neuro:  Denies headaches. Psych:  Denies anxiety and depression.  Physical Exam  General:  alert.   Head:  normocephalic.   Eyes:  pupils equal and pupils round.   Ears:  right ear with miminal cerumen left ear with clear fluid Nose:  no nasal discharge.   Mouth:  poor dentition.   Neck:  supple.   Chest Wall:  no deformities.   Breasts:  no abnormal thickening.   Lungs:  normal breath sounds.   Heart:  normal rate and regular rhythm.   Abdomen:  soft, non-tender, and normal bowel sounds.   Rectal:  no external abnormalities.   Msk:  active Extremities:  no edeme Neurologic:  somulent  Pelvic Exam  Vulva:      normal appearance.   Urethra and Bladder:      Urethra--no discharge.   Vagina:      physiologic discharge.   Cervix:      absent Adnexa:      nontender bilaterally.   Rectum:      normal, heme negative stool.    Diabetes Management Exam:    Eye Exam:       Eye Exam done elsewhere          Date: 02/02/2009          Results: normal          Done by: optho    Impression & Recommendations:  Problem #  1:  ROUTINE GYNECOLOGICAL EXAMINATION (ICD-V72.31) PHQ-9 score: unable to complete mammogram up to date PAP done EKG done optho up to date needs dental visit  Problem # 2:  DIABETES MELLITUS, TYPE II (ICD-250.00) stable Her updated medication list for this problem includes:    Humulin 70/30 70-30 % Susp (Insulin isophane & regular) .Marland KitchenMarland KitchenMarland KitchenMarland Kitchen 15 units  subcutaneously q am    Glucophage 500 Mg Tabs (Metformin hcl) .Marland Kitchen... Take 1 tablet two times a day    Lisinopril 20 Mg Tabs (Lisinopril) .Marland Kitchen... 2 tablets by mouth daily for blood pressure    Aspirin Ec 81 Mg Tbec (Aspirin) .Marland Kitchen... Take 1 tablet by mouth once a day  Orders: Capillary Blood Glucose/CBG (82948) Hemoglobin A1C (83036)  Problem # 3:  ESSENTIAL HYPERTENSION, BENIGN (ICD-401.1) still slightly elevated metoprolol just added in hospital Her updated medication list for this problem includes:    Hydrochlorothiazide 25 Mg Tabs (Hydrochlorothiazide) .Marland Kitchen... Take 1 tablet by mouth every morning    Lisinopril 20 Mg Tabs (Lisinopril) .Marland Kitchen... 2 tablets by mouth daily for blood pressure    Metoprolol Tartrate 50 Mg Tabs (Metoprolol tartrate) ..... One tablet by mouth two times a day  Orders: EKG w/ Interpretation (93000)  Problem # 4:  STROKE (ICD-434.91) will refer back to neurology recent hospitalization for subcortical CVA Her updated medication list for this problem includes:    Aspirin Ec 81 Mg Tbec (Aspirin) .Marland Kitchen... Take 1 tablet by mouth once a day  Orders: Neurology Referral (Neuro)  Complete Medication List: 1)  Humulin 70/30 70-30 % Susp (Insulin isophane & regular) .Marland Kitchen.. 15 units subcutaneously q am 2)  Glucophage 500 Mg Tabs (Metformin hcl) .... Take 1 tablet two times a day 3)  Minocin 50 Mg Caps (Minocycline hcl) .... Take 1 tablet by mouth two times a day 4)  Hydrochlorothiazide 25 Mg Tabs (Hydrochlorothiazide) .... Take 1 tablet by mouth every morning 5)  Tylenol 325 Mg Tabs (Acetaminophen) .... Take one to two tablets by mouth every four hoursas needed pain 6)  Cetaphil Crea (Emollient) .... To affected areas daily 7)  Prolixin 25mg  Per Ml  .... 2cc im every 2 weeks per dr. Hortencia Pilar 8)  Ativan 2 Mg Tabs (Lorazepam) .... Take 2 tablets po at bedtime 9)  Vesicare 10 Mg Tabs (Solifenacin succinate) .... Take 1 tablet by mouth once a day 10)  Simvastatin 10 Mg Tabs  (Simvastatin) .... One tablet by mouth nightly for cholesterol **pharmacy - d/c lipitor** 11)  Lisinopril 20 Mg Tabs (Lisinopril) .... 2 tablets by mouth daily for blood pressure 12)  Replacement Bipap At 14/10 With Heated Humidity  .... Also with mask of choice and supplies dx=obstructive sleep apnea fax to [phone] attn:andy 13)  Bedside Commode  .... Use bedside commode for patient safety 14)  Prolixin 5mg   .... Take one tablet by mouth at bedtime (per [facility]) 15)  Bedside Commode  16)  Aspirin Ec 81 Mg Tbec (Aspirin) .... Take 1 tablet by mouth once a day 17)  Ventolin Hfa 108 (90 Base) Mcg/act Aers (Albuterol sulfate) .... 2 puffs q4h as needed wheeze 18)  Flonase 50 Mcg/act Susp (Fluticasone propionate) .... Use 2 squirts to each nostril once daily 19)  Benztropine Mesylate 1 Mg Tabs (Benztropine mesylate) .... Take 1/2 tablet by mouth two times a day .  dr. love and bartlett. 20)  Naprosyn 250 Mg Tabs (Naproxen) .... Take 1 tablet by mouth every 12 hours as needed for pain unrelieved by tylenol 21)  Qvar 40  Mcg/act Aers (Beclomethasone dipropionate) .... 2 puffs inhalation two times a day 22)  Tessalon Perles 100 Mg Caps (Benzonatate) .... One capsule by mouth two times a day as needed for cough 23)  Metoprolol Tartrate 50 Mg Tabs (Metoprolol tartrate) .... One tablet by mouth two times a day  Diabetes Management Assessment/Plan:      The following lipid goals have been established for the patient: Total cholesterol goal of 200; LDL cholesterol goal of 70; HDL cholesterol goal of 40; Triglyceride goal of 150.  Her blood pressure goal is < 130/80.    Hypertension Assessment/Plan:      The patient's hypertensive risk group is category C: Target organ damage and/or diabetes.  Her calculated 10 year risk of coronary heart disease is 24 %.  Today's blood pressure is 157/101.  Her blood pressure goal is < 130/80.  Lipid Assessment/Plan:      Based on NCEP/ATP III, the patient's risk factor  category is "history of coronary disease, peripheral vascular disease, cerebrovascular disease, or aortic aneurysm along with either diabetes, current smoker, or LDL > 130 plus HDL < 40 plus triglycerides > 200".  The patient's lipid goals are as follows: Total cholesterol goal is 200; LDL cholesterol goal is 70; HDL cholesterol goal is 40; Triglyceride goal is 150.    Patient Instructions: 1)  Brenda Riggs will need to return to the office on either this tomorrow or Friday for fasting labs since she has already eaten today. 2)  No food after midnight before this visit. She can drink water only. 3)  Will need lipids, cmet, cbc, HIV, TSH, u/a, microalbumin  4)  (codes 250.00, 295,30, 272.4) 5)  Get FL2 co-signed by Dr. Everette Rank returns for labs 6)  Bring blood pressure log into the office (leave for [facility] to review next week)  Laboratory Results   Blood Tests   Date/Time Received: Sep 09, 2009 8:52 AM   HGBA1C: 6.2%   (Normal Range: Non-Diabetic - 3-6%   Control Diabetic - 6-8%) CBG Random:: 168       EKG  Procedure date:  09/09/2009  Findings:      NSR  X-ray  Procedure date:  09/03/2009  Findings:      left rib - no acute abnormality  CT Brain  Procedure date:  09/03/2009  Findings:      old brainstem infarct and chronic small vessel white matter ischemic changes in both cerebral hemisphers. impression was minimal diffuse cerebral and cerebellar atrophy. no acute abnormality  MRI Brain  Procedure date:  09/03/2009  Findings:      small acute subcortical white matter infarch, right posterior frontoparietal region, progressive atrophy and extensive small vessel disease  MRA Cerebral  Procedure date:  09/03/2009  Findings:      mild intracranial atherosclerotic change affecting the distal MCA and PCA branches.  No proximal flow limiting stenosis  Carotid Doppler  Procedure date:  09/03/2009  Findings:      no significant extracranial carotid artery  stenosis   EKG  Procedure date:  09/09/2009  Findings:      NSR  X-ray  Procedure date:  09/03/2009  Findings:      left rib - no acute abnormality  CT Brain  Procedure date:  09/03/2009  Findings:      old brainstem infarct and chronic small vessel white matter ischemic changes in both cerebral hemisphers. impression was minimal diffuse cerebral and cerebellar atrophy. no acute abnormality  MRI Brain  Procedure date:  09/03/2009  Findings:      small acute subcortical white matter infarch, right posterior frontoparietal region, progressive atrophy and extensive small vessel disease  MRA Cerebral  Procedure date:  09/03/2009  Findings:      mild intracranial atherosclerotic change affecting the distal MCA and PCA branches.  No proximal flow limiting stenosis  Carotid Doppler  Procedure date:  09/03/2009  Findings:      no significant extracranial carotid artery stenosis

## 2010-06-01 NOTE — Progress Notes (Signed)
Summary: Neurology referral  Phone Note Outgoing Call   Summary of Call: refer to to neurology seen before by Dr. Sandria Manly hx of CVA but recent hospitalization for subcortical CVA; needs neurology f/u Initial call taken by: Lehman Prom FNP,  Sep 09, 2009 1:38 PM

## 2010-06-01 NOTE — Progress Notes (Signed)
Summary: PHYSICAL THERAPYCALLED/FALLING ASLEEP  Phone Note Other Incoming Call back at [phone]   [facility]er: [facility] Summary of Call: [facility] WENT OUT TO SEE Torrie TODAY FOR  PHYSICAL THERAPY, AND Kahleah KEPT FALLING ASLEEP, AND LETHARGIC. BARBARA THE CAREGIVER SAYS THAT Aadvika WAS THIS WALL ALL AFTERNOON YESTERDAY AND THIS MORNING. SHE TOOK HER BP, AND IT WAS 140/70 AND HER HEART RATE WAS 80 Initial call taken by: Leodis Rains,  October 01, 2009 12:42 PM  Follow-up for Phone Call        forward to N. Martin,fnp  Additional Follow-up for Phone Call Additional follow up Details #1::        Jennaya needs a neurology consult (see other open phone note - what is the status of this referral?) Is she wearing her CPAP machine at night?  Additional Follow-up by: Lehman Prom FNP,  October 02, 2009 3:09 PM    Additional Follow-up for Phone Call Additional follow up Details #2::    Pt. is wearing her CPAP every night. Contacted Referral coordinator to determine progress on referral Gaylyn Cheers RN  October 05, 2009 10:53 AM   Additional Follow-up for Phone Call Additional follow up Details #3:: Details for Additional Follow-up Action Taken: Pt will be scheduled for neurology either this week or next per D. Harland Dingwall - referral coordinator Additional Follow-up by: Lehman Prom FNP,  October 05, 2009 1:36 PM

## 2010-06-01 NOTE — Letter (Signed)
Summary: [facility]/DISCHARGE  [facility]   Imported By: Arta Bruce 11/17/2009 11:14:59  _____________________________________________________________________  External Attachment:    Type:   Image     Comment:   External Document

## 2010-06-01 NOTE — Letter (Signed)
Summary: Discharge Summary  Discharge Summary   Imported By: Arta Bruce 11/04/2009 15:24:19  _____________________________________________________________________  External Attachment:    Type:   Image     Comment:   External Document

## 2010-06-01 NOTE — Progress Notes (Signed)
Summary: Neuro update  Phone Note From Other Clinic   Caller: Referral Coordinator Summary of Call: [facility]isted [facility] is aware of Pt's appt @ [facility] on 11-19-09 @ 2:15pm with Dr.Love Initial call taken by: Candi Leash,  October 12, 2009 9:21 AM  Follow-up for Phone Call        [place]. Thanks Follow-up by: Lehman Prom FNP,  October 12, 2009 9:36 AM

## 2010-06-01 NOTE — Progress Notes (Signed)
Summary: NEW ORDERS FOR BIPAP MACHINE SUPPLIES  Phone Note Call from Patient Call back at Home Phone [phone]   Caller: [facility] Summary of Call: MARTIN PT. BARBARA CAME BY AND SAYS THAT Taimane NEEDS A NEW ORDER FOR SUPPLIES TO HER BIPAP MACHINE TO BE SENT TO [facility]. ITS BEEN 2 YRS AND ITS TIME TO SEND A NEW ORDER IN. Initial call taken by: Leodis Rains,  October 22, 2009 2:46 PM  Follow-up for Phone Call        forward to N. Daphine Deutscher, FNP Follow-up by: Levon Hedger,  October 23, 2009 11:57 AM  Additional Follow-up for Phone Call Additional follow up Details #1::        order in system Additional Follow-up by: Brynda Rim,  October 23, 2009 2:32 PM    Additional Follow-up for Phone Call Additional follow up Details #2::    If [facility] needs anything additional such as orders for an already established pt they usually send that form to this office.  Follow-up by: Lehman Prom FNP,  October 26, 2009 7:53 AM

## 2010-06-01 NOTE — Progress Notes (Signed)
Summary: Neurology referral  Phone Note From Other Clinic   Summary of Call: Beth from [facility] called because Ms. Brenda Riggs seems that she is falling more often than normal and she is not using her walker.  It seems that the pt is refusing using the walker. [phone] [facility] Initial call taken by: Manon Hilding,  Sep 18, 2009 4:26 PM  Follow-up for Phone Call        spoke with Britta Mccreedy pt's aid and she said that pt was in the hospital 2 weeks ago and was told she had a light stroke.  I have hospital discharge from E-charts and will place on your desk for review Follow-up by: Levon Hedger,  Sep 29, 2009 4:34 PM  Additional Follow-up for Phone Call Additional follow up Details #1::        Stanton Kidney - what is the status of pts neurology referral? (See phone note on 09/09/2009) Additional Follow-up by: Lehman Prom FNP,  September 30, 2009 8:33 AM    Additional Follow-up for Phone Call Additional follow up Details #2::    Pt should get sched either this week or next week.Sorry this one slipped thru my fingers.Trying to keep up.Please forgive me Follow-up by: Candi Leash,  October 05, 2009 1:31 PM  Additional Follow-up for Phone Call Additional follow up Details #3:: Details for Additional Follow-up Action Taken: noted Additional Follow-up by: Lehman Prom FNP,  October 05, 2009 1:34 PM

## 2010-06-01 NOTE — Progress Notes (Signed)
Summary: Query:  Refill Minocycline and Glucophage?  Phone Note Outgoing Call   Summary of Call: Do you want to refill Glucophage and minocycline?  Last seen 08/2009.   Initial call taken by: Dutch Quint RN,  March 16, 2010 2:07 PM  Follow-up for Phone Call        yes, ok to refill Follow-up by: Lehman Prom FNP,  March 17, 2010 11:19 AM  Additional Follow-up for Phone Call Additional follow up Details #1::        Noted == refills completed.  Dutch Quint RN  March 17, 2010 12:23 PM     Prescriptions: MINOCIN 50 MG  CAPS (MINOCYCLINE HCL) Take 1 tablet by mouth two times a day  #60 Each x 3   Entered by:   Dutch Quint RN   Authorized by:   Lehman Prom FNP   Signed by:   Dutch Quint RN on 03/17/2010   Method used:   Electronically to        [facility]* (retail)       [address]       [place], [place]  [id number]       Ph: [phone]       Fax: [phone]   RxID:   [id number]

## 2010-06-01 NOTE — Letter (Signed)
Summary: DRUG REVIEW  DRUG REVIEW   Imported By: Arta Bruce 10/20/2009 14:33:17  _____________________________________________________________________  External Attachment:    Type:   Image     Comment:   External Document

## 2010-06-01 NOTE — Progress Notes (Signed)
Summary: KEEPS FALLING AND WETTING ON HERSELF  Phone Note Call from Patient Call back at [phone]   Caller: [facility] Summary of Call: MARTIN PT. BARBARA CALLED AND SAYS THAT LAST NIGHT Di STARTED COMPLAINING OF CRAMPS IN HER LEGS, AND THEN SHE STARTED FALLING AND BARBARA HAS BEEN UP WITH HER ALL NIGHT. SHE TRIED TO KEEP HER IN THE BED. BUT SHE WANT STAY, SHE IS WETTING ALL ON HERSELF LIKE CRAZY. BARBARA WAS GOING TO TAKE HER TO THE HOSPITAL, BUT Lauree REFUSED, AND SHE SAYS WHEN THEY REFUSE TO GO, THE FACILITY CAN'T TAKE THEM.  BARBARA SAYS THAT SHE DOESN'T KNOW WHAT TO DO. Initial call taken by: Leodis Rains,  July 28, 2009 10:17 AM  Follow-up for Phone Call        forward to N. Daphine Deutscher, FNP Follow-up by: Levon Hedger,  July 28, 2009 11:37 AM  Additional Follow-up for Phone Call Additional follow up Details #1::        it is my suggestion that if pt is acting out of character  then they take her to the ER or call EMS to come get her. If she is acting out of character then she is likely not making rationale decisions regarding her health and if the caretaker recognizes something abnormal she should act on it Additional Follow-up by: Lehman Prom FNP,  July 28, 2009 2:50 PM    Additional Follow-up for Phone Call Additional follow up Details #2::    Levon Hedger  July 29, 2009 12:42 PM Left message on machine for pt's caretaker  to return call to the office  Additional Follow-up for Phone Call Additional follow up Details #3:: Details for Additional Follow-up Action Taken: SHARED THE INFORMATIN WITH BARBARA ABOUT Apurva MAY NEED TO GO TO THE ER OR CALL 911. BARBARA SAYS THAT IF SHE GETS ANY WORSE SHE WILL CALL 911 OR GO TO THE ER. Additional Follow-up by: Leodis Rains,  August 03, 2009 11:26 AM

## 2010-06-03 NOTE — Letter (Signed)
Summary: [facility]  [facility]   Imported By: Arta Bruce 05/11/2010 14:40:13  _____________________________________________________________________  External Attachment:    Type:   Image     Comment:   External Document

## 2010-06-03 NOTE — Progress Notes (Signed)
Summary: Query:  Refill Humulin 70/30, Lopressor and HCTZ?  Phone Note Outgoing Call   Summary of Call: Last seen for CPE 08/2009.  Refill Humulin 70/30, Metoprolol and HCTZ? Initial call taken by: Dutch Quint RN,  April 27, 2010 1:01 PM  Follow-up for Phone Call        yes, ok to refill Follow-up by: Lehman Prom FNP,  April 27, 2010 1:09 PM  Additional Follow-up for Phone Call Additional follow up Details #1::        Noted.  Refills completed.   Dutch Quint RN  April 27, 2010 2:21 PM     Prescriptions: HUMULIN 70/30 70-30 % SUSP (INSULIN ISOPHANE & REGULAR) 15 units subcutaneously q am  #1 month x 3   Entered by:   Dutch Quint RN   Authorized by:   Lehman Prom FNP   Signed by:   Dutch Quint RN on 04/27/2010   Method used:   Electronically to        [facility]* (retail)       [address]       [place], [place]  [id number]       Ph: [phone]       Fax: [phone]   RxID:   [id number]

## 2010-06-03 NOTE — Progress Notes (Signed)
Summary: Query:  Refill lisinopril?  Phone Note Outgoing Call   Summary of Call: Refill lisinopril?  Last seen 5/11. Initial call taken by: Dutch Quint RN,  April 09, 2010 6:07 PM  Follow-up for Phone Call        yes, ok to refill Follow-up by: Lehman Prom FNP,  April 12, 2010 8:04 AM  Additional Follow-up for Phone Call Additional follow up Details #1::        Refill completed.  Dutch Quint RN  April 12, 2010 9:38 AM     Prescriptions: LISINOPRIL 20 MG TABS (LISINOPRIL) 2 tablets by mouth daily for blood pressure  #60 x 3   Entered by:   Dutch Quint RN   Authorized by:   Lehman Prom FNP   Signed by:   Dutch Quint RN on 04/12/2010   Method used:   Electronically to        [facility]* (retail)       [address]       [place], [place]  [id number]       Ph: [phone]       Fax: [phone]   RxID:   [id number]

## 2010-06-04 NOTE — Letter (Signed)
Summary: PLAN OF CARE 09/07/09 TO 11/05/09  PLAN OF CARE 09/07/09 TO 11/05/09   Imported By: Silvio Pate Stanislawscyk 10/30/2009 09:51:39  _____________________________________________________________________  External Attachment:    Type:   Image     Comment:   External Document

## 2010-06-08 ENCOUNTER — Telehealth (INDEPENDENT_AMBULATORY_CARE_PROVIDER_SITE_OTHER): Payer: Self-pay | Admitting: Nurse Practitioner

## 2010-06-17 ENCOUNTER — Telehealth (INDEPENDENT_AMBULATORY_CARE_PROVIDER_SITE_OTHER): Payer: Self-pay | Admitting: Nurse Practitioner

## 2010-06-17 NOTE — Progress Notes (Signed)
Summary: Query:  Refill naproxen?  Phone Note Outgoing Call   Summary of Call: CPE last 08/2009.  Refill Naproxen per protocol? Initial call taken by: Dutch Quint RN,  June 08, 2010 5:08 PM  Follow-up for Phone Call        yes, ok to refill Follow-up by: Lehman Prom FNP,  June 09, 2010 10:31 AM  Additional Follow-up for Phone Call Additional follow up Details #1::        Noted.  Refill completed.  Dutch Quint RN  June 09, 2010 12:04 PM     Prescriptions: NAPROSYN 250 MG TABS (NAPROXEN) Take 1 tablet by mouth every 12 hours as needed for pain unrelieved by tylenol  #60 x 3   Entered by:   Dutch Quint RN   Authorized by:   Lehman Prom FNP   Signed by:   Dutch Quint RN on 06/09/2010   Method used:   Electronically to        [facility]* (retail)       [address]       [place], [place]  [id number]       Ph: [phone]       Fax: [phone]   RxID:   [id number]

## 2010-06-23 NOTE — Progress Notes (Signed)
Summary: Query:  Refill simvastatin?  Phone Note Outgoing Call   Summary of Call: CPE 08/2009.  Refill simvastatin per protocol? Initial call taken by: Dutch Quint RN,  June 17, 2010 2:42 PM  Follow-up for Phone Call        yes, ok to refill Follow-up by: Lehman Prom FNP,  June 17, 2010 3:04 PM  Additional Follow-up for Phone Call Additional follow up Details #1::        Noted.  Refill completed.  Dutch Quint RN  June 17, 2010 3:20 PM     New/Updated Medications: SIMVASTATIN 10 MG TABS (SIMVASTATIN) One tablet by mouth nightly for cholesterol Prescriptions: SIMVASTATIN 10 MG TABS (SIMVASTATIN) One tablet by mouth nightly for cholesterol  #30 x 3   Entered by:   Dutch Quint RN   Authorized by:   Lehman Prom FNP   Signed by:   Dutch Quint RN on 06/17/2010   Method used:   Electronically to        [facility]* (retail)       [address]       [place], [place]  [id number]       Ph: [phone]       Fax: [phone]   RxID:   [id number]

## 2010-07-18 LAB — URINALYSIS, ROUTINE W REFLEX MICROSCOPIC
Glucose, UA: NEGATIVE mg/dL
Ketones, ur: NEGATIVE mg/dL
Protein, ur: NEGATIVE mg/dL

## 2010-07-18 LAB — BASIC METABOLIC PANEL
Calcium: 9.5 mg/dL (ref 8.4–10.5)
GFR calc Af Amer: >60 mL/min (ref >60)
GFR calc non Af Amer: >60 mL/min (ref >60)
Potassium: 3.7 mEq/L (ref 3.5–5.1)
Sodium: 138 mEq/L (ref 135–145)

## 2010-07-18 LAB — RAPID URINE DRUG SCREEN, HOSP PERFORMED
Amphetamines: NOT DETECTED
Benzodiazepines: POSITIVE — AB
Tetrahydrocannabinol: NOT DETECTED

## 2010-07-18 LAB — CBC
HCT: 38.4 % (ref 36.0–46.0)
Hemoglobin: 12.6 g/dL (ref 12.0–15.0)
MCHC: 32.7 g/dL (ref 30.0–36.0)
RBC: 4.1 MIL/uL (ref 3.87–5.11)
WBC: 8.1 10*3/uL (ref 4.0–10.5)

## 2010-07-18 LAB — DIFFERENTIAL
Eosinophils Absolute: 0 10*3/uL (ref 0.0–0.7)
Eosinophils Relative: 1 % (ref 0–5)
Lymphs Abs: 2.2 10*3/uL (ref 0.7–4.0)

## 2010-07-18 LAB — GLUCOSE, CAPILLARY
Glucose-Capillary: 161 mg/dL — ABNORMAL HIGH (ref 70–99)
Glucose-Capillary: 416 mg/dL — ABNORMAL HIGH (ref 70–99)

## 2010-07-20 LAB — DIFFERENTIAL
Eosinophils Absolute: 0 10*3/uL (ref 0.0–0.7)
Eosinophils Relative: 0 % (ref 0–5)
Lymphocytes Relative: 27 % (ref 12–46)
Lymphs Abs: 3.1 10*3/uL (ref 0.7–4.0)
Monocytes Relative: 12 % (ref 3–12)
Neutrophils Relative %: 59 % (ref 43–77)

## 2010-07-20 LAB — GLUCOSE, CAPILLARY
Glucose-Capillary: 110 mg/dL — ABNORMAL HIGH (ref 70–99)
Glucose-Capillary: 123 mg/dL — ABNORMAL HIGH (ref 70–99)
Glucose-Capillary: 129 mg/dL — ABNORMAL HIGH (ref 70–99)

## 2010-07-20 LAB — POCT CARDIAC MARKERS
Myoglobin, poc: >500 ng/mL (ref 12–200)
Troponin i, poc: <0.05 ng/mL (ref 0.00–0.09)

## 2010-07-20 LAB — COMPREHENSIVE METABOLIC PANEL
ALT: 15 U/L (ref 0–35)
AST: 27 U/L (ref 0–37)
Calcium: 9.4 mg/dL (ref 8.4–10.5)
Creatinine, Ser: 0.74 mg/dL (ref 0.4–1.2)
GFR calc Af Amer: >60 mL/min (ref >60)
Sodium: 141 mEq/L (ref 135–145)
Total Protein: 6.7 g/dL (ref 6.0–8.3)

## 2010-07-20 LAB — LIPID PANEL
Cholesterol: 161 mg/dL (ref 0–200)
LDL Cholesterol: 101 mg/dL — ABNORMAL HIGH (ref 0–99)

## 2010-07-20 LAB — URINALYSIS, ROUTINE W REFLEX MICROSCOPIC
Ketones, ur: NEGATIVE mg/dL
Nitrite: NEGATIVE
Protein, ur: NEGATIVE mg/dL
Urobilinogen, UA: 0.2 mg/dL (ref 0.0–1.0)
pH: 5.5 (ref 5.0–8.0)

## 2010-07-20 LAB — RAPID URINE DRUG SCREEN, HOSP PERFORMED
Amphetamines: NOT DETECTED
Benzodiazepines: POSITIVE — AB
Cocaine: NOT DETECTED
Opiates: NOT DETECTED
Tetrahydrocannabinol: NOT DETECTED

## 2010-07-20 LAB — CK TOTAL AND CKMB (NOT AT ARMC)
CK, MB: 6.5 ng/mL (ref 0.3–4.0)
Total CK: 446 U/L — ABNORMAL HIGH (ref 7–177)

## 2010-07-20 LAB — HOMOCYSTEINE: Homocysteine: 7.4 umol/L (ref 4.0–15.4)

## 2010-07-20 LAB — CARDIAC PANEL(CRET KIN+CKTOT+MB+TROPI)
CK, MB: 5.9 ng/mL — ABNORMAL HIGH (ref 0.3–4.0)
CK, MB: 7.8 ng/mL (ref 0.3–4.0)
Relative Index: 0.9 (ref 0.0–2.5)
Relative Index: 1 (ref 0.0–2.5)
Total CK: 656 U/L — ABNORMAL HIGH (ref 7–177)
Troponin I: 0.02 ng/mL (ref 0.00–0.06)

## 2010-07-20 LAB — FOLATE: Folate: >20 ng/mL

## 2010-07-20 LAB — CBC
MCHC: 33.2 g/dL (ref 30.0–36.0)
MCV: 94.3 fL (ref 78.0–100.0)
RDW: 14.4 % (ref 11.5–15.5)

## 2010-07-20 LAB — HEMOGLOBIN A1C
Hgb A1c MFr Bld: 5.8 % — ABNORMAL HIGH (ref <5.7)
Mean Plasma Glucose: 120 mg/dL — ABNORMAL HIGH (ref <117)

## 2010-07-20 LAB — VITAMIN B12: Vitamin B-12: 702 pg/mL (ref 211–911)

## 2010-07-20 LAB — TSH: TSH: 1.337 u[IU]/mL (ref 0.350–4.500)

## 2013-03-19 ENCOUNTER — Ambulatory Visit (INDEPENDENT_AMBULATORY_CARE_PROVIDER_SITE_OTHER): Payer: Medicaid Other

## 2013-03-19 VITALS — BP 124/63 | HR 65 | Resp 24 | Ht 60.0 in | Wt 144.0 lb

## 2013-03-19 DIAGNOSIS — B351 Tinea unguium: Secondary | ICD-10-CM

## 2013-03-19 DIAGNOSIS — E1140 Type 2 diabetes mellitus with diabetic neuropathy, unspecified: Secondary | ICD-10-CM

## 2013-03-19 DIAGNOSIS — M79609 Pain in unspecified limb: Secondary | ICD-10-CM

## 2013-03-19 DIAGNOSIS — E1149 Type 2 diabetes mellitus with other diabetic neurological complication: Secondary | ICD-10-CM

## 2013-03-19 DIAGNOSIS — E1142 Type 2 diabetes mellitus with diabetic polyneuropathy: Secondary | ICD-10-CM

## 2013-03-19 NOTE — Patient Instructions (Signed)
Diabetes and Foot Care Diabetes may cause you to have problems because of poor blood supply (circulation) to your feet and legs. This may cause the skin on your feet to become thinner, break easier, and heal more slowly. Your skin may become dry, and the skin may peel and crack. You may also have nerve damage in your legs and feet causing decreased feeling in them. You may not notice minor injuries to your feet that could lead to infections or more serious problems. Taking care of your feet is one of the most important things you can do for yourself.  HOME CARE INSTRUCTIONS  Wear shoes at all times, even in the house. Do not go barefoot. Bare feet are easily injured.  Check your feet daily for blisters, cuts, and redness. If you cannot see the bottom of your feet, use a mirror or ask someone for help.  Wash your feet with warm water (do not use hot water) and mild soap. Then pat your feet and the areas between your toes until they are completely dry. Do not soak your feet as this can dry your skin.  Apply a moisturizing lotion or petroleum jelly (that does not contain alcohol and is unscented) to the skin on your feet and to dry, brittle toenails. Do not apply lotion between your toes.  Trim your toenails straight across. Do not dig under them or around the cuticle. File the edges of your nails with an emery board or nail file.  Do not cut corns or calluses or try to remove them with medicine.  Wear clean socks or stockings every day. Make sure they are not too tight. Do not wear knee-high stockings since they may decrease blood flow to your legs.  Wear shoes that fit properly and have enough cushioning. To break in new shoes, wear them for just a few hours a day. This prevents you from injuring your feet. Always look in your shoes before you put them on to be sure there are no objects inside.  Do not cross your legs. This may decrease the blood flow to your feet.  If you find a minor scrape,  cut, or break in the skin on your feet, keep it and the skin around it clean and dry. These areas may be cleansed with mild soap and water. Do not cleanse the area with peroxide, alcohol, or iodine.  When you remove an adhesive bandage, be sure not to damage the skin around it.  If you have a wound, look at it several times a day to make sure it is healing.  Do not use heating pads or hot water bottles. They may burn your skin. If you have lost feeling in your feet or legs, you may not know it is happening until it is too late.  Make sure your health care provider performs a complete foot exam at least annually or more often if you have foot problems. Report any cuts, sores, or bruises to your health care provider immediately. SEEK MEDICAL CARE IF:   You have an injury that is not healing.  You have cuts or breaks in the skin.  You have an ingrown nail.  You notice redness on your legs or feet.  You feel burning or tingling in your legs or feet.  You have pain or cramps in your legs and feet.  Your legs or feet are numb.  Your feet always feel cold. SEEK IMMEDIATE MEDICAL CARE IF:   There is increasing redness,   swelling, or pain in or around a wound.  There is a red line that goes up your leg.  Pus is coming from a wound.  You develop a fever or as directed by your health care provider.  You notice a bad smell coming from an ulcer or wound. Document Released: 04/15/2000 Document Revised: 12/19/2012 Document Reviewed: 09/25/2012 ExitCare Patient Information 2014 [employer].  

## 2013-03-19 NOTE — Progress Notes (Signed)
  Subjective:    Patient ID: Brenda Riggs, female    DOB: 1955/07/31, 57 y.o.   MRN: [id number] Trim my toenails."  HPI no changes in medication her health history at this time.    Review of Systems deferred at this visit     Objective:   Physical Exam Neurovascular status is intact as follows DP +2/4 PT thready pulse one over 4 bilateral. Capillary fill time 3-4 seconds all digits. Skin temperature warm turgor normal no edema noted. Neurologically epicritic and proprioceptive sensations diminished on Semmes Weinstein testing bilateral. There is normal plantar response and DTRs noted. Orthopedic exam unremarkable noncontributory rectus foot mild flexible digital contractures are noted. Neurologic exam nails are thick yellow discolored and brittle with incurvation ingrowing the presence of diabetes and complicating factors.       Assessment & Plan:  Assessment this time his diabetes with peripheral neuropathy. Thick brittle friable criptotic nails debrided 1 through 5 bilateral the presence of onychomycosis as well as diabetes and neuropathy. Return for future palliative care as needed suggest 3 month followup. Patient denies to continue maintain diabetic extra-depth shoes at all times  Alvan Dame DPM

## 2013-06-18 ENCOUNTER — Ambulatory Visit: Payer: Medicaid Other

## 2013-07-09 ENCOUNTER — Ambulatory Visit (INDEPENDENT_AMBULATORY_CARE_PROVIDER_SITE_OTHER): Payer: Medicaid Other

## 2013-07-09 VITALS — BP 114/58 | HR 73 | Resp 16

## 2013-07-09 DIAGNOSIS — M79609 Pain in unspecified limb: Secondary | ICD-10-CM

## 2013-07-09 DIAGNOSIS — E1142 Type 2 diabetes mellitus with diabetic polyneuropathy: Secondary | ICD-10-CM

## 2013-07-09 DIAGNOSIS — E1149 Type 2 diabetes mellitus with other diabetic neurological complication: Secondary | ICD-10-CM

## 2013-07-09 DIAGNOSIS — B351 Tinea unguium: Secondary | ICD-10-CM

## 2013-07-09 DIAGNOSIS — E1140 Type 2 diabetes mellitus with diabetic neuropathy, unspecified: Secondary | ICD-10-CM

## 2013-07-09 NOTE — Patient Instructions (Signed)
Diabetes and Foot Care Diabetes may cause you to have problems because of poor blood supply (circulation) to your feet and legs. This may cause the skin on your feet to become thinner, break easier, and heal more slowly. Your skin may become dry, and the skin may peel and crack. You may also have nerve damage in your legs and feet causing decreased feeling in them. You may not notice minor injuries to your feet that could lead to infections or more serious problems. Taking care of your feet is one of the most important things you can do for yourself.  HOME CARE INSTRUCTIONS  Wear shoes at all times, even in the house. Do not go barefoot. Bare feet are easily injured.  Check your feet daily for blisters, cuts, and redness. If you cannot see the bottom of your feet, use a mirror or ask someone for help.  Wash your feet with warm water (do not use hot water) and mild soap. Then pat your feet and the areas between your toes until they are completely dry. Do not soak your feet as this can dry your skin.  Apply a moisturizing lotion or petroleum jelly (that does not contain alcohol and is unscented) to the skin on your feet and to dry, brittle toenails. Do not apply lotion between your toes.  Trim your toenails straight across. Do not dig under them or around the cuticle. File the edges of your nails with an emery board or nail file.  Do not cut corns or calluses or try to remove them with medicine.  Wear clean socks or stockings every day. Make sure they are not too tight. Do not wear knee-high stockings since they may decrease blood flow to your legs.  Wear shoes that fit properly and have enough cushioning. To break in new shoes, wear them for just a few hours a day. This prevents you from injuring your feet. Always look in your shoes before you put them on to be sure there are no objects inside.  Do not cross your legs. This may decrease the blood flow to your feet.  If you find a minor scrape,  cut, or break in the skin on your feet, keep it and the skin around it clean and dry. These areas may be cleansed with mild soap and water. Do not cleanse the area with peroxide, alcohol, or iodine.  When you remove an adhesive bandage, be sure not to damage the skin around it.  If you have a wound, look at it several times a day to make sure it is healing.  Do not use heating pads or hot water bottles. They may burn your skin. If you have lost feeling in your feet or legs, you may not know it is happening until it is too late.  Make sure your health care provider performs a complete foot exam at least annually or more often if you have foot problems. Report any cuts, sores, or bruises to your health care provider immediately. SEEK MEDICAL CARE IF:   You have an injury that is not healing.  You have cuts or breaks in the skin.  You have an ingrown nail.  You notice redness on your legs or feet.  You feel burning or tingling in your legs or feet.  You have pain or cramps in your legs and feet.  Your legs or feet are numb.  Your feet always feel cold. SEEK IMMEDIATE MEDICAL CARE IF:   There is increasing redness,   swelling, or pain in or around a wound.  There is a red line that goes up your leg.  Pus is coming from a wound.  You develop a fever or as directed by your health care provider.  You notice a bad smell coming from an ulcer or wound. Document Released: 04/15/2000 Document Revised: 12/19/2012 Document Reviewed: 09/25/2012 ExitCare Patient Information 2014 [employer].  

## 2013-07-09 NOTE — Progress Notes (Signed)
   Subjective:    Patient ID: Rhett BannisterGloria J Bouch, female    DOB: 04/23/1956, 58 y.o.   MRN: [id number]  HPI Comments: "Cut the toenails, I guess"     Review of Systems no new changes or findings    Objective:   Physical Exam Vascular status intact DP 2/4  PT thready at one over 4 bilateral capillary refill timed 3-4 seconds. Epicritic and proprioceptive sensations diminished on Semmes Weinstein testing to the forefoot and digits there is normal plantar response DTRs not elicited dermatologically skin color pigment normal hair growth absent nails thick criptotic incurvated friable tender and discoloration 1 through 5 bilateral no new changes or cocking factors noted patient does have thick brittle discolored yellowed nails tenderness on palpation with enclosed shoe wear at times as well.       Assessment & Plan:  Assessment diabetes with peripheral neuropathy. Patient also is thick criptotic incurvated yellow brittle mycotic nails 1 through 5 bilateral debridement at this time the presence of onychomycosis as well as diabetes and complications. Return for 3 month followup for continued diabetic foot and nail care  Alvan Dameichard [place] Delauder DPM

## 2014-01-21 ENCOUNTER — Ambulatory Visit (INDEPENDENT_AMBULATORY_CARE_PROVIDER_SITE_OTHER): Payer: Medicaid Other

## 2014-01-21 DIAGNOSIS — M79673 Pain in unspecified foot: Secondary | ICD-10-CM

## 2014-01-21 DIAGNOSIS — M79609 Pain in unspecified limb: Secondary | ICD-10-CM

## 2014-01-21 DIAGNOSIS — E119 Type 2 diabetes mellitus without complications: Secondary | ICD-10-CM

## 2014-01-21 DIAGNOSIS — B351 Tinea unguium: Secondary | ICD-10-CM

## 2014-01-21 NOTE — Progress Notes (Signed)
   Subjective:    Patient ID: Brenda Riggs, female    DOB: 11-17-1955, 58 y.o.   MRN: [id number]  HPI  Pt presents for nail debridement  Review of Systems no new findings or systemic changes noted     Objective:   Physical Exam  Neurovascular status is unchanged PT pulses DP pulses intact thready PT pulse intact DP +2/4 capillary refill timed 3-4 seconds nails thick brittle crumbly discolored friable dystrophic. Painful tender both on palpation and with enclosed shoe wear no other infections no secondary wounds patient ambulating in accommodative shoes as instructed. Should note patient somewhat disoriented visit today      Assessment & Plan:  Assessment diabetes history peripheral neuropathy thick painful mycotic incurvated friable nails 1 through 5 bilateral debrided return in 3 months for continued palliative care in the future as needed  Alvan Dame DPM

## 2014-04-22 ENCOUNTER — Other Ambulatory Visit: Payer: Medicaid Other

## 2014-05-20 ENCOUNTER — Ambulatory Visit (INDEPENDENT_AMBULATORY_CARE_PROVIDER_SITE_OTHER): Payer: Medicaid Other

## 2014-05-20 DIAGNOSIS — E1140 Type 2 diabetes mellitus with diabetic neuropathy, unspecified: Secondary | ICD-10-CM

## 2014-05-20 DIAGNOSIS — B351 Tinea unguium: Secondary | ICD-10-CM

## 2014-05-20 DIAGNOSIS — M79673 Pain in unspecified foot: Secondary | ICD-10-CM

## 2014-05-20 NOTE — Progress Notes (Signed)
   Subjective:    Patient ID: Brenda Riggs, female    DOB: 12/26/1955, 59 y.o.   MRN: [id number]  HPI  Pt presents for nail debridement  Review of Systems no new findings or systemic changes noted     Objective:   Physical Exam Neurovascular status unchanged pedal pulses DP and PT intact with thready bilateral. Refill timed 3-4 seconds all digits dorsalis pedis was 2 over 4 on the left there is thick brittle criptotic nails 1 through 5 bilateral painful tender symptomatic both on palpation and with enclosed shoe wear no open wounds no ulcers no secondary infections       Assessment & Plan:  Assessment this time is history of peripheral neuropathy painful mycotic dystrophic nails debrided 10 return for future palliative care every 3 months as recommended  Alvan Dameichard Dinero Chavira DPM

## 2014-06-27 ENCOUNTER — Ambulatory Visit (INDEPENDENT_AMBULATORY_CARE_PROVIDER_SITE_OTHER)
Admission: RE | Admit: 2014-06-27 | Discharge: 2014-06-27 | Disposition: A | Discharge status: Home / Self Care | Payer: Medicaid Other | Source: Ambulatory Visit | Attending: Internal Medicine | Admitting: Internal Medicine

## 2014-06-27 ENCOUNTER — Ambulatory Visit (INDEPENDENT_AMBULATORY_CARE_PROVIDER_SITE_OTHER): Payer: Medicaid Other | Admitting: Internal Medicine

## 2014-06-27 ENCOUNTER — Encounter: Payer: Self-pay | Admitting: Internal Medicine

## 2014-06-27 VITALS — BP 102/64 | HR 65 | Ht 62.0 in | Wt 140.0 lb

## 2014-06-27 DIAGNOSIS — Z720 Tobacco use: Secondary | ICD-10-CM | POA: Diagnosis not present

## 2014-06-27 DIAGNOSIS — F17210 Nicotine dependence, cigarettes, uncomplicated: Secondary | ICD-10-CM

## 2014-06-27 DIAGNOSIS — I10 Essential (primary) hypertension: Secondary | ICD-10-CM | POA: Diagnosis not present

## 2014-06-27 DIAGNOSIS — R062 Wheezing: Secondary | ICD-10-CM

## 2014-06-27 MED ORDER — VALSARTAN-HYDROCHLOROTHIAZIDE 160-12.5 MG PO TABS: 1.0000 | ORAL_TABLET | Freq: Every day | ORAL | Status: DC

## 2014-06-27 NOTE — Progress Notes (Signed)
Quick Note:  Called and asked to speak with Brenda JacobsonHelen Was advised she is gone until 06/30/14  [facility]

## 2014-06-27 NOTE — Patient Instructions (Addendum)
Stop lisinopril and start valsartan 160 mg daily   Please remember to go to the x-ray department downstairs for your tests - we will call you with the results when they are available.

## 2014-06-27 NOTE — Progress Notes (Signed)
Subjective:    Patient ID: Brenda Riggs, female    DOB: 04/23/1956,     MRN: [id number]  HPI  [id number] yowbf active smoker with ? Schizophrenia referred by Dr Julio Sickssei Bonsu for eval of ? ashtma   06/27/2014 1st [facility] office visit/ Tim Corriher   Chief Complaint  Patient presents with  . Pulmonary Consult    Referred by Dr. Julio Sickssei Bonsu for eval of asthma. Pt states that she was dxed with PNA in 2000.  Caregiver notices that she get SOB in the evenings, walking from room to room at the facility. She has also notices some wheezing.   pt not able to verabalize any complaints at all so hx comes only from the caretaker who does appear to know her well and says this problem waxex and wanes x ? 2000  What the caretaker notes is only daytime, not apparently disturbing sleep or wakening pt prematurely in am. ? Some better p saba though technique is quite poor and also supposed to be maintained on qvar  No obvious   day to day or daytime variabilty or assoc chronic cough/ excess mucus production  or cp or chest tightness,   overt sinus or hb symptoms. No unusual exp hx or h/o childhood pna/ asthma or knowledge of premature birth.  Sleeping ok without nocturnal  or early am exacerbation  of respiratory  c/o's or need for noct saba. Also denies any obvious fluctuation of symptoms with weather or environmental changes or other aggravating or alleviating factors except as outlined above   Current Medications, Allergies, Complete Past Medical History, Past Surgical History, Family History, and Social History were reviewed in [facility] electronic [facility] record.           Review of Systems  Constitutional: Negative for fever, chills and unexpected weight change.  HENT: Positive for congestion. Negative for dental problem, ear pain, nosebleeds, postnasal drip, rhinorrhea, sinus pressure, sneezing, sore throat, trouble swallowing and voice change.   Eyes: Negative for visual disturbance.  Respiratory:  Positive for shortness of breath. Negative for cough and choking.   Cardiovascular: Negative for chest pain and leg swelling.  Gastrointestinal: Negative for vomiting, abdominal pain and diarrhea.  Genitourinary: Negative for difficulty urinating.  Musculoskeletal: Negative for arthralgias.  Skin: Negative for rash.  Neurological: Negative for tremors, syncope and headaches.  Hematological: Does not bruise/bleed easily.       Objective:   Physical Exam    amb bf very stoic/ abn affect/ classic pseudowheeze  Wt Readings from Last 3 Encounters:  06/27/14 140 lb (63.504 kg)  03/19/13 144 lb (65.318 kg)  09/09/09 149 lb 11.2 oz (67.903 kg)    Vital signs reviewed  HEENT: nl dentition, turbinates, and orophanx. Nl external ear canals without cough reflex   NECK :  without JVD/Nodes/TM/ nl carotid upstrokes bilaterally   LUNGS: no acc muscle use, clear to A and P bilaterally without cough on insp or exp maneuvers   CV:  RRR  no s3 or murmur or increase in P2, no edema   ABD:  soft and nontender with nl excursion in the supine position. No bruits or organomegaly, bowel sounds nl  MS:  warm without deformities, calf tenderness, cyanosis or clubbing  SKIN: warm and dry without lesions    NEURO:  alert, approp, no deficits    CXR PA and Lateral:   06/27/2014 :     I personally reviewed images and agree with radiology impression as follows:  No acute abnormalities. Mild enlargement of cardiac silhouette.             Assessment & Plan:

## 2014-06-28 DIAGNOSIS — F17210 Nicotine dependence, cigarettes, uncomplicated: Secondary | ICD-10-CM | POA: Insufficient documentation

## 2014-06-28 NOTE — Assessment & Plan Note (Addendum)
Symptoms are markedly disproportionate to objective findings and not clear this is a lung problem but pt does appear to have difficult airway management issues.   DDX of  difficult airways management all start with A and  include Adherence, Ace Inhibitors, Acid Reflux, Active Sinus Disease, Alpha 1 Antitripsin deficiency, Anxiety masquerading as Airways dz,  ABPA,  allergy(esp in young), Aspiration (esp in elderly), Adverse effects of DPI,  Active smokers, plus two Bs  = Bronchiectasis and Beta blocker use..and one C= CHF   ? ACEi related   Extremely difficult hx as the "symptoms" are only related by the caretkaer and what she's desribing  Amounts to mostly daytime wheezing heard louder s a stethoscope and therefore is an ace case/ upper airway problem  Until proven other wise and the only way to tell is a trial off and f/u prn     ? BB effect > if not better off acei >> Strongly prefer in this setting: Bystolic, the most beta -1  selective Beta blocker available in sample form, with bisoprolol the most selective generic choice  on the market.

## 2014-06-28 NOTE — Assessment & Plan Note (Signed)
Clearly she cannot purchase her own cigarettes and doubt she's smoking much but obviously needs to be discouraged in the setting of unexplained resp symptoms

## 2014-06-28 NOTE — Assessment & Plan Note (Signed)
ACE inhibitors are problematic in  pts with airway complaints because  even experienced pulmonologists can't always distinguish ace effects from copd/asthma.  By themselves they don't actually cause a problem, much like oxygen can't by itself start a fire, but they certainly serve as a powerful catalyst or enhancer for any "fire"  or inflammatory process in the upper airway, be it caused by an ET  tube or more commonly reflux (especially in the obese or pts with known GERD or who are on biphoshonates).    In the era of ARB near equivalency until we have a better handle on the reversibility of the airway problem, it just makes sense to avoid ACEI  Entirely here given how difficult it is in given cognitive impairment to sort out her non-specific symptoms.  Try valsartan 160 mg daily and f/u prn

## 2014-06-30 NOTE — Progress Notes (Signed)
Quick Note:  Spoke with pt and notified of results per Dr. Wert. Pt verbalized understanding and denied any questions.  ______ 

## 2014-07-01 ENCOUNTER — Encounter: Payer: Self-pay | Admitting: Pulmonary Disease

## 2014-07-01 ENCOUNTER — Ambulatory Visit (INDEPENDENT_AMBULATORY_CARE_PROVIDER_SITE_OTHER): Payer: Medicaid Other | Admitting: Pulmonary Disease

## 2014-07-01 VITALS — BP 124/76 | HR 80 | Temp 97.0°F | Ht 61.0 in | Wt 142.2 lb

## 2014-07-01 DIAGNOSIS — Z8669 Personal history of other diseases of the nervous system and sense organs: Secondary | ICD-10-CM

## 2014-07-01 DIAGNOSIS — G4733 Obstructive sleep apnea (adult) (pediatric): Secondary | ICD-10-CM | POA: Insufficient documentation

## 2014-07-01 NOTE — Patient Instructions (Signed)
Will schedule for repeat sleep study to see if you still have sleep apnea after your significant weight loss Will arrange followup with me once the results are available.

## 2014-07-01 NOTE — Progress Notes (Signed)
Subjective:    Patient ID: Brenda Riggs, female    DOB: Dec 09, 1955, 59 y.o.   MRN: [id number]  HPI The patient is a 59 year old female who I've been asked to see for management of obstructive sleep apnea. She tells me that she was diagnosed over 10 years ago with sleep apnea, and was prescribed a C Pap. She initially wore on a consistent basis, but most recently is only wearing one to 2 times a week at the most. She also tells me that she lost well over 50 pounds since her initial sleep study. Currently, she has been noted to have snoring, but no one has mentioned an abnormal breathing pattern during sleep. She is rested only about 50% of the mornings by her history, but observers do not see her falling asleep during the day despite doing inactive things. She has no issues in the evenings with sleepiness while trying to watch television. The patient's Epworth score today is 9. It is very difficult to get a history from her due to communication issues.   Sleep Questionnaire What time do you typically go to bed?( Between what hours) 8:30p-9p 8:30p-9p at 1554 on 07/01/14 by Tommie Sams, CMA How long does it take you to fall asleep? 30 min 30 min at 1554 on 07/01/14 by Tommie Sams, CMA How many times during the night do you wake up? 2 2 at 1554 on 07/01/14 by Tommie Sams, CMA What time do you get out of bed to start your day? 0900 0900 at 1554 on 07/01/14 by Tommie Sams, CMA Do you drive or operate heavy machinery in your occupation? No No at 1554 on 07/01/14 by Tommie Sams, CMA How much has your weight changed (up or down) over the past two years? (In pounds) 2 lb (0.907 kg) 2 lb (0.907 kg) at 1554 on 07/01/14 by Tommie Sams, CMA Have you ever had a sleep study before? Yes Yes at 1554 on 07/01/14 by Tommie Sams, CMA If yes, [facility] of [facility] at 1554 on 07/01/14 by Tommie Sams, CMA If yes, date of study? sometime b/w 2000-2007 sometime b/w 2000-2007 at 1554  on 07/01/14 by Tommie Sams, CMA Do you currently use CPAP? Yes Yes at 1554 on 07/01/14 by Tommie Sams, CMA If so, what pressure? not sure not sure at 1554 on 07/01/14 by Tommie Sams, CMA Do you wear oxygen at any time? No No at 1554 on 07/01/14 by Tommie Sams, CMA   Review of Systems  Constitutional: Negative for fever and unexpected weight change.  HENT: Negative for congestion, dental problem, ear pain, nosebleeds, postnasal drip, rhinorrhea, sinus pressure, sneezing, sore throat and trouble swallowing.   Eyes: Negative for redness and itching.  Respiratory: Negative for cough, chest tightness, shortness of breath and wheezing.   Cardiovascular: Negative for palpitations and leg swelling.  Gastrointestinal: Negative for nausea and vomiting.  Genitourinary: Negative for dysuria.  Musculoskeletal: Negative for joint swelling.  Skin: Negative for rash.  Neurological: Negative for headaches.  Hematological: Does not bruise/bleed easily.  Psychiatric/Behavioral: Negative for dysphoric mood. The patient is not nervous/anxious.        Objective:   Physical Exam Constitutional:  Overweight female, no acute distress  HENT:  Nares patent without discharge  Oropharynx without exudate, palate and uvula are elongated  Eyes:  Perrla, eomi, no scleral icterus  Neck:  No JVD, no TMG  Cardiovascular:  Normal rate, regular rhythm,  no rubs or gallops.  2/6 sem        Intact distal pulses  Pulmonary :  Normal breath sounds, no stridor or respiratory distress   No rales, rhonchi, or wheezing  Abdominal:  Soft, nondistended, bowel sounds present.  No tenderness noted.   Musculoskeletal:  mild lower extremity edema noted.  Lymph Nodes:  No cervical lymphadenopathy noted  Skin:  No cyanosis noted  Neurologic:  Alert, difficult communication, moves all 4 extremities without obvious deficit.         Assessment & Plan:

## 2014-07-01 NOTE — Assessment & Plan Note (Signed)
The patient tells me that she was diagnosed with sleep apnea roughly 10 years ago, and currently is only using C Pap on occasion. She also notes that she has lost significant weight since the sleep study, and it is unclear to me at this point if she even still has sleep apnea. We will need to do a repeat sleep study to put the issue to rest, and the patient is agreeable to this approach. I will arrange follow-up once the results are available.

## 2014-09-11 ENCOUNTER — Ambulatory Visit (HOSPITAL_BASED_OUTPATIENT_CLINIC_OR_DEPARTMENT_OTHER): Payer: Medicaid Other | Attending: Pulmonary Disease | Admitting: Radiology

## 2014-09-11 VITALS — Ht 60.0 in | Wt 141.0 lb

## 2014-09-11 DIAGNOSIS — Z8669 Personal history of other diseases of the nervous system and sense organs: Secondary | ICD-10-CM

## 2014-09-11 DIAGNOSIS — G4733 Obstructive sleep apnea (adult) (pediatric): Secondary | ICD-10-CM

## 2014-09-16 ENCOUNTER — Ambulatory Visit: Payer: Medicaid Other

## 2014-09-24 ENCOUNTER — Telehealth: Payer: Self-pay | Admitting: Pulmonary Disease

## 2014-09-24 NOTE — Telephone Encounter (Signed)
ATC pt line busy wcb 

## 2014-09-24 NOTE — Telephone Encounter (Signed)
Needs ov to review recent sleep study

## 2014-09-25 ENCOUNTER — Other Ambulatory Visit: Payer: Self-pay | Admitting: Internal Medicine

## 2014-09-25 DIAGNOSIS — G4733 Obstructive sleep apnea (adult) (pediatric): Secondary | ICD-10-CM | POA: Diagnosis not present

## 2014-09-25 NOTE — Sleep Study (Signed)
   NAME: Brenda Riggs DATE OF BIRTH:  05/30/1955 MEDICAL RECORD NUMBER [id number]  LOCATION: [facility]  PHYSICIAN: Barbaraann ShareCLANCE,KEITH M  DATE OF STUDY: 09/11/2014  SLEEP STUDY TYPE: Nocturnal Polysomnogram               REFERRING PHYSICIAN: Clance, Maree KrabbeKeith M, MD  INDICATION FOR STUDY: Hypersomnia with sleep apnea  EPWORTH SLEEPINESS SCORE:  4 HEIGHT: 5' (152.4 cm)  WEIGHT: 141 lb (63.957 kg)    Body mass index is 27.54 kg/(m^2).  NECK SIZE: 14 in.  MEDICATIONS: Reviewed in the sleep record  SLEEP ARCHITECTURE: The patient was found to have a total sleep time of 232 minutes with no slow-wave sleep and decreased quantity of REM. Sleep onset latency was prolonged at 39 minutes, as was REM onset at 217 minutes. Sleep efficiency was poor at 55%.  RESPIRATORY DATA: The patient was found to have 77 apneas and 2 obstructive hypopneas, giving her an AHI of 20 events per hour. The events occurred in all body positions, but were accentuated during REM. There was moderate snoring noted throughout.  OXYGEN DATA: There was oxygen desaturation as low as 92% with the patient's obstructive events  CARDIAC DATA: Occasional PAC and PVC noted  MOVEMENT/PARASOMNIA: No significant periodic limb movements were seen, and no abnormal behaviors.  IMPRESSION/ RECOMMENDATION:    1) moderate obstructive sleep apnea/hypopnea syndrome, with an AHI of 20 events per hour and oxygen desaturation as low as 92%. Treatment for this degree of sleep apnea can include a trial of weight loss alone, upper airway surgery, dental appliance, and also CPAP. Clinical correlation is suggested.  2) occasional PAC and PVC noted, but no clinically significant arrhythmias were seen.    Barbaraann ShareLANCE,KEITH M Diplomate, American Board of Sleep Medicine  ELECTRONICALLY SIGNED ON:  09/25/2014, 9:12 AM [facility] PH: [phone]   FX: [phone]) [phone] ACCREDITED BY THE AMERICAN ACADEMY OF SLEEP  MEDICINE

## 2014-09-25 NOTE — Telephone Encounter (Signed)
Called pt and line rings busy Spoke with pt daughter at [phone] (M) She advised me she will have to get in contact at the residence where pt stays and have them give us a call back.

## 2014-09-30 NOTE — Telephone Encounter (Signed)
Called spoke with pt care giver. [facility]

## 2014-10-01 ENCOUNTER — Encounter: Payer: Self-pay | Admitting: Pulmonary Disease

## 2014-10-01 ENCOUNTER — Ambulatory Visit (INDEPENDENT_AMBULATORY_CARE_PROVIDER_SITE_OTHER): Payer: Medicaid Other | Admitting: Pulmonary Disease

## 2014-10-01 VITALS — BP 126/74 | HR 71 | Temp 98.0°F | Ht 60.0 in | Wt 141.6 lb

## 2014-10-01 DIAGNOSIS — G4733 Obstructive sleep apnea (adult) (pediatric): Secondary | ICD-10-CM

## 2014-10-01 NOTE — Assessment & Plan Note (Signed)
The patient has moderate obstructive sleep apnea by her recent sleep study, and I have reviewed the various treatment options with her. I have recommended that she try C Pap again since she has moderate disease, and she is willing to do this.

## 2014-10-01 NOTE — Progress Notes (Signed)
   Subjective:    Patient ID: Brenda BannisterGloria J Gauna, female    DOB: 05/01/1956, 59 y.o.   MRN: [id number]  HPI Patient comes in today for follow-up of her recent sleep study. She was found to have moderate obstructive sleep apnea, with an AHI of 20 events per hour and oxygen desaturation as low as 92%. I have reviewed the study with her in detail, and answered all of her questions.   Review of Systems  Constitutional: Negative for fever and unexpected weight change.  HENT: Negative for congestion, dental problem, ear pain, nosebleeds, postnasal drip, rhinorrhea, sinus pressure, sneezing, sore throat and trouble swallowing.   Eyes: Negative for redness and itching.  Respiratory: Negative for cough, chest tightness, shortness of breath and wheezing.   Cardiovascular: Negative for palpitations and leg swelling.  Gastrointestinal: Negative for nausea and vomiting.  Genitourinary: Negative for dysuria.  Musculoskeletal: Negative for joint swelling.  Skin: Negative for rash.  Neurological: Negative for headaches.  Hematological: Does not bruise/bleed easily.  Psychiatric/Behavioral: Negative for dysphoric mood. The patient is not nervous/anxious.        Objective:   Physical Exam Overweight female in no acute distress Nose without purulence or discharge noted Neck without lymphadenopathy or thyromegaly Lower extremities with mild edema, no cyanosis Alert and oriented, moves all 4 extremities.       Assessment & Plan:

## 2014-10-01 NOTE — Patient Instructions (Signed)
Will start on cpap for your sleep apnea.  Please call if having issues with tolerance Work on weight loss. followup with Dr. Craige CottaSood in 8 weeks.

## 2014-11-27 ENCOUNTER — Ambulatory Visit: Payer: Medicaid Other | Admitting: Adult Health

## 2014-12-02 ENCOUNTER — Encounter: Payer: Self-pay | Admitting: Adult Health

## 2014-12-02 ENCOUNTER — Ambulatory Visit (INDEPENDENT_AMBULATORY_CARE_PROVIDER_SITE_OTHER): Payer: Medicaid Other | Admitting: Adult Health

## 2014-12-02 VITALS — BP 100/60 | HR 70 | Temp 98.3°F | Ht 60.0 in | Wt 139.0 lb

## 2014-12-02 DIAGNOSIS — G4733 Obstructive sleep apnea (adult) (pediatric): Secondary | ICD-10-CM | POA: Diagnosis not present

## 2014-12-02 NOTE — Progress Notes (Signed)
   Subjective:    Patient ID: Brenda Riggs, female    DOB: [birth year], 59 y.o.   MRN: [id number]  HPI 59 year old female with moderate obstructive sleep apnea with AHI of 20 events per hour and desaturations as low as 92%   12/02/2014 Follow up : OSA  Patient returns for her two-month follow-up. Patient was started on CPAP Last visit. Patient is accompanied by her caregiver from assisted living. She has paranoid schizophrenia is on several medications. He says that she refuses to wear her C Pap most nights. Download shows very poor compliance We discussed the importance of wearing her C Pap Educated on  effects on uncontrolled sleep apnea. Information was also reviewed with her caregiver today. She denies any chest pain, orthopnea, PND or leg swelling.   Review of Systems Constitutional:   No  weight loss, night sweats,  Fevers, chills, fatigue, or  lassitude.  HEENT:   No headaches,  Difficulty swallowing,  Tooth/dental problems, or  Sore throat,                No sneezing, itching, ear ache, nasal congestion, post nasal drip,   CV:  No chest pain,  Orthopnea, PND, swelling in lower extremities, anasarca, dizziness, palpitations, syncope.   GI  No heartburn, indigestion, abdominal pain, nausea, vomiting, diarrhea, change in bowel habits, loss of appetite, bloody stools.   Resp: No shortness of breath with exertion or at rest.  No excess mucus, no productive cough,  No non-productive cough,  No coughing up of blood.  No change in color of mucus.  No wheezing.  No chest wall deformity  Skin: no rash or lesions.  GU: no dysuria, change in color of urine, no urgency or frequency.  No flank pain, no hematuria   MS:  No joint pain or swelling.  No decreased range of motion.  No back pain.  Psych:  No change in mood or affect. No depression or anxiety.  No memory loss.         Objective:   Physical Exam GEN: A/Ox3; pleasant , NAD, obese  HEENT:  [place]/AT,  EACs-clear, TMs-wnl,  NOSE-clear, THROAT-clear, no lesions, no postnasal drip or exudate noted. Class III airway  NECK:  Supple w/ fair ROM; no JVD; normal carotid impulses w/o bruits; no thyromegaly or nodules palpated; no lymphadenopathy.  RESP  Clear  P & A; w/o, wheezes/ rales/ or rhonchi.no accessory muscle use, no dullness to percussion  CARD:  RRR, no m/r/g  , no peripheral edema, pulses intact, no cyanosis or clubbing.  GI:   Soft & nt; nml bowel sounds; no organomegaly or masses detected.  Musco: Warm bil, no deformities or joint swelling noted.   Neuro: alert, no focal deficits noted.    Skin: Warm, no lesions or rashes         Assessment & Plan:

## 2014-12-02 NOTE — Assessment & Plan Note (Signed)
Poorly controlled sleep apnea with poor compliance Patient education given  Plan  Please wear CPAP every night for at least 4-6 hours .  Work on weight loss.  follow up Dr. Vassie Loll  In 3 months with download

## 2014-12-02 NOTE — Patient Instructions (Signed)
Please wear CPAP every night for at least 4-6 hours .  Work on weight loss.  follow up Dr. Vassie Loll  In 3 months .

## 2014-12-03 NOTE — Progress Notes (Signed)
Reviewed & agree with plan  

## 2015-01-28 ENCOUNTER — Other Ambulatory Visit: Payer: Self-pay | Admitting: Internal Medicine

## 2015-01-29 ENCOUNTER — Ambulatory Visit: Payer: Medicaid Other | Admitting: Podiatry

## 2015-01-29 NOTE — Telephone Encounter (Signed)
Pt requesting refill on Diovan 160/12.5mg .   Pt is a former [facility] pt with a recall to see RA in 03/2015.  Pt has seen MW once in the past for an acute visit, was refilled by him once.    TP are you ok with refilling this med until pt establishes with RA?  Thanks!

## 2015-02-04 ENCOUNTER — Ambulatory Visit (INDEPENDENT_AMBULATORY_CARE_PROVIDER_SITE_OTHER): Payer: Medicaid Other | Admitting: Podiatry

## 2015-02-04 ENCOUNTER — Encounter: Payer: Self-pay | Admitting: Podiatry

## 2015-02-04 DIAGNOSIS — M79676 Pain in unspecified toe(s): Secondary | ICD-10-CM

## 2015-02-04 DIAGNOSIS — E1140 Type 2 diabetes mellitus with diabetic neuropathy, unspecified: Secondary | ICD-10-CM

## 2015-02-04 DIAGNOSIS — B351 Tinea unguium: Secondary | ICD-10-CM

## 2015-02-05 NOTE — Progress Notes (Signed)
Patient ID: Brenda Riggs, female   DOB: 20-Dec-1955, 59 y.o.   MRN: [id number] Complaint:  Visit Type: Patient returns to my office for continued preventative foot care services. Complaint: Patient states" my nails have grown long and thick and become painful to walk and wear shoes" Patient has been diagnosed with DM with no foot complications. The patient presents for preventative foot care services. No changes to ROS  Podiatric Exam: Vascular: dorsalis pedis and posterior tibial pulses are palpable bilateral. Capillary return is immediate. Temperature gradient is WNL. Skin turgor WNL  Sensorium: Normal Semmes Weinstein monofilament test. Normal tactile sensation bilaterally. Nail Exam: Pt has thick disfigured discolored nails noted left third toe and right second toenail. Ulcer Exam: There is no evidence of ulcer or pre-ulcerative changes or infection. Orthopedic Exam: Muscle tone and strength are WNL. No limitations in general ROM. No crepitus or effusions noted. Foot type and digits show no abnormalities. Bony prominences are unremarkable. Skin: No Porokeratosis. No infection or ulcers  Diagnosis:  Onychomycosis, , Pain in right toe, pain in left toes  Treatment & Plan Procedures and Treatment: Consent by patient was obtained for treatment procedures. The patient understood the discussion of treatment and procedures well. All questions were answered thoroughly reviewed. Debridement of mycotic and hypertrophic toenails, 1 through 5 bilateral and clearing of subungual debris. No ulceration, no infection noted.  Return Visit-Office Procedure: Patient instructed to return to the office for a follow up visit 3 months for continued evaluation and treatment.

## 2015-02-16 NOTE — Telephone Encounter (Signed)
Per TP  Rx needs to be addressed by MW

## 2015-02-16 NOTE — Telephone Encounter (Signed)
This pt has not had labs in epic in 5 years so ok to give one month only and make sure she either sees primary or Tammy Np before this runs out

## 2015-02-16 NOTE — Telephone Encounter (Signed)
Spoke with the pt's caregiver and notified that pt needs appt before refill runs out  Rx sent and appt for 03/05/15

## 2015-03-05 ENCOUNTER — Ambulatory Visit: Payer: Medicaid Other | Admitting: Internal Medicine

## 2015-04-15 ENCOUNTER — Other Ambulatory Visit: Payer: Self-pay | Admitting: Internal Medicine

## 2015-05-19 ENCOUNTER — Ambulatory Visit (INDEPENDENT_AMBULATORY_CARE_PROVIDER_SITE_OTHER): Payer: Medicaid Other | Admitting: Pulmonary Disease

## 2015-05-19 ENCOUNTER — Encounter: Payer: Self-pay | Admitting: Pulmonary Disease

## 2015-05-19 VITALS — BP 112/68 | HR 74 | Ht 60.0 in | Wt 143.6 lb

## 2015-05-19 DIAGNOSIS — G4733 Obstructive sleep apnea (adult) (pediatric): Secondary | ICD-10-CM | POA: Diagnosis not present

## 2015-05-19 DIAGNOSIS — Z720 Tobacco use: Secondary | ICD-10-CM

## 2015-05-19 DIAGNOSIS — F17210 Nicotine dependence, cigarettes, uncomplicated: Secondary | ICD-10-CM

## 2015-05-19 NOTE — Progress Notes (Signed)
   Subjective:    Patient ID: Brenda Riggs, female    DOB: 12/31/1955, 60 y.o.   MRN: [id number]  HPI Paranoid schizophrenia, lives in a group home, with moderate obstructive sleep apnea with AHI of 20 events per hour and desaturations as low as 92%  Chief Complaint  Patient presents with  . Follow-up    Former [facility] pt. Pt states that she does wear her CPAP regularly, but compliance report does not indicate that. Pt states that she does take naps in the daytime. Pt does report that the mask is often uncomfortable.    42-month follow-up. Patient was started on CPAP 10/2014 Patient is accompanied by her caregiver from group home   she refuses to wear her C Pap most nights. Download 12/2014 & again 05/2015 shows very poor compliance Last visit, We discussed the importance of wearing her C Pap, Educated on  effects on uncontrolled sleep apnea.  She has been unable to use CPAP and just does not desire to, unable to verbalize  reasons    Review of Systems neg for any significant sore throat, dysphagia, itching, sneezing, nasal congestion or excess/ purulent secretions, fever, chills, sweats, unintended wt loss, pleuritic or exertional cp, hempoptysis, orthopnea pnd or change in chronic leg swelling. Also denies presyncope, palpitations, heartburn, abdominal pain, nausea, vomiting, diarrhea or change in bowel or urinary habits, dysuria,hematuria, rash, arthralgias, visual complaints, headache, numbness weakness or ataxia.     Objective:   Physical Exam  Gen. Pleasant, well-nourished, in no distress, flat affect ENT - no lesions, no post nasal drip Neck: No JVD, no thyromegaly, no carotid bruits Lungs: no use of accessory muscles, no dullness to percussion, clear without rales or rhonchi  Cardiovascular: Rhythm regular, heart sounds  normal, no murmurs or gallops, no peripheral edema Musculoskeletal: No deformities, no cyanosis or clubbing        Assessment & Plan:

## 2015-05-19 NOTE — Patient Instructions (Addendum)
Will send Rx to [facility] to discontinue CPAP due to poor compliance Follow up as needed

## 2015-05-19 NOTE — Assessment & Plan Note (Addendum)
She has been unable to use her CPAP-and I doubt that she will ever be able to. Her mental illness appears to be reasonably controlled.  Her OSA is moderate, however she is also not a candidate for oral appliances for the same reason  Will send Rx to [facility] to discontinue CPAP due to poor compliance  FU as needed

## 2015-05-20 NOTE — Assessment & Plan Note (Signed)
ssmoking cessation again emphasized this visit

## 2015-07-22 ENCOUNTER — Ambulatory Visit (INDEPENDENT_AMBULATORY_CARE_PROVIDER_SITE_OTHER): Payer: Medicaid Other | Admitting: Podiatry

## 2015-07-22 DIAGNOSIS — M79676 Pain in unspecified toe(s): Secondary | ICD-10-CM | POA: Diagnosis not present

## 2015-07-22 DIAGNOSIS — B351 Tinea unguium: Secondary | ICD-10-CM | POA: Diagnosis not present

## 2015-07-22 DIAGNOSIS — E1140 Type 2 diabetes mellitus with diabetic neuropathy, unspecified: Secondary | ICD-10-CM

## 2015-07-22 NOTE — Progress Notes (Signed)
Patient ID: [name], female   DOB: 10/22/1955, 60 y.o.   MRN: [id number] Complaint:  Visit Type: Patient returns to my office for continued preventative foot care services. Complaint: Patient states" my nails have grown long and thick and become painful to walk and wear shoes" Patient has been diagnosed with DM with no foot complications. The patient presents for preventative foot care services. No changes to ROS  Podiatric Exam: Vascular: dorsalis pedis and posterior tibial pulses are palpable bilateral. Capillary return is immediate. Temperature gradient is WNL. Skin turgor WNL  Sensorium: Normal Semmes Weinstein monofilament test. Normal tactile sensation bilaterally. Nail Exam: Pt has thick disfigured discolored nails noted left third toe and right second toenail. Ulcer Exam: There is no evidence of ulcer or pre-ulcerative changes or infection. Orthopedic Exam: Muscle tone and strength are WNL. No limitations in general ROM. No crepitus or effusions noted. Foot type and digits show no abnormalities. Bony prominences are unremarkable. Skin: No Porokeratosis. No infection or ulcers  Diagnosis:  Onychomycosis, , Pain in right toe, pain in left toes  Treatment & Plan Procedures and Treatment: Consent by patient was obtained for treatment procedures. The patient understood the discussion of treatment and procedures well. All questions were answered thoroughly reviewed. Debridement of mycotic and hypertrophic toenails, 1 through 5 bilateral and clearing of subungual debris. No ulceration, no infection noted.  Return Visit-Office Procedure: Patient instructed to return to the office for a follow up visit 3 months for continued evaluation and treatment.

## 2015-12-24 ENCOUNTER — Encounter: Payer: Self-pay | Admitting: Podiatry

## 2015-12-24 ENCOUNTER — Ambulatory Visit (INDEPENDENT_AMBULATORY_CARE_PROVIDER_SITE_OTHER): Payer: Medicaid Other | Admitting: Podiatry

## 2015-12-24 DIAGNOSIS — E1140 Type 2 diabetes mellitus with diabetic neuropathy, unspecified: Secondary | ICD-10-CM | POA: Diagnosis not present

## 2015-12-24 DIAGNOSIS — B351 Tinea unguium: Secondary | ICD-10-CM | POA: Diagnosis not present

## 2015-12-24 DIAGNOSIS — M79676 Pain in unspecified toe(s): Secondary | ICD-10-CM

## 2015-12-24 NOTE — Progress Notes (Signed)
Patient ID: Brenda Riggs, female   DOB: 04/13/1956, 60 y.o.   MRN: [id number] Complaint:  Visit Type: Patient returns to my office for continued preventative foot care services. Complaint: Patient states" my nails have grown long and thick and become painful to walk and wear shoes" Patient has been diagnosed with DM with no foot complications. The patient presents for preventative foot care services. No changes to ROS  Podiatric Exam: Vascular: dorsalis pedis and posterior tibial pulses are palpable bilateral. Capillary return is immediate. Temperature gradient is WNL. Skin turgor WNL  Sensorium: Normal Semmes Weinstein monofilament test. Normal tactile sensation bilaterally. Nail Exam: Pt has thick disfigured discolored nails noted left third toe and right second toenail. Ulcer Exam: There is no evidence of ulcer or pre-ulcerative changes or infection. Orthopedic Exam: Muscle tone and strength are WNL. No limitations in general ROM. No crepitus or effusions noted. Foot type and digits show no abnormalities. Bony prominences are unremarkable. Skin: No Porokeratosis. No infection or ulcers  Diagnosis:  Onychomycosis, , Pain in right toe, pain in left toes  Treatment & Plan Procedures and Treatment: Consent by patient was obtained for treatment procedures. The patient understood the discussion of treatment and procedures well. All questions were answered thoroughly reviewed. Debridement of mycotic and hypertrophic toenails, 1 through 5 bilateral and clearing of subungual debris. No ulceration, no infection noted.  Return Visit-Office Procedure: Patient instructed to return to the office for a follow up visit 3 months for continued evaluation and treatment.

## 2016-03-17 ENCOUNTER — Ambulatory Visit: Payer: Medicaid Other | Admitting: Podiatry

## 2016-03-17 ENCOUNTER — Ambulatory Visit (INDEPENDENT_AMBULATORY_CARE_PROVIDER_SITE_OTHER): Payer: Medicaid Other | Admitting: Podiatry

## 2016-03-17 ENCOUNTER — Encounter: Payer: Self-pay | Admitting: Podiatry

## 2016-03-17 VITALS — Ht 60.0 in | Wt 143.0 lb

## 2016-03-17 DIAGNOSIS — E1140 Type 2 diabetes mellitus with diabetic neuropathy, unspecified: Secondary | ICD-10-CM

## 2016-03-17 DIAGNOSIS — B351 Tinea unguium: Secondary | ICD-10-CM

## 2016-03-17 DIAGNOSIS — M79676 Pain in unspecified toe(s): Secondary | ICD-10-CM | POA: Diagnosis not present

## 2016-03-17 NOTE — Progress Notes (Signed)
Patient ID: Brenda Riggs, female   DOB: 09/17/1955, 60 y.o.   MRN: [id number] Complaint:  Visit Type: Patient returns to my office for continued preventative foot care services. Complaint: Patient states" my nails have grown long and thick and become painful to walk and wear shoes" Patient has been diagnosed with DM with no foot complications. The patient presents for preventative foot care services. No changes to ROS  Podiatric Exam: Vascular: dorsalis pedis and posterior tibial pulses are palpable bilateral. Capillary return is immediate. Temperature gradient is WNL. Skin turgor WNL  Sensorium: Normal Semmes Weinstein monofilament test. Normal tactile sensation bilaterally. Nail Exam: Pt has thick disfigured discolored nails noted left third toe and right second toenail. Ulcer Exam: There is no evidence of ulcer or pre-ulcerative changes or infection. Orthopedic Exam: Muscle tone and strength are WNL. No limitations in general ROM. No crepitus or effusions noted. Foot type and digits show no abnormalities. Bony prominences are unremarkable. Skin: No Porokeratosis. No infection or ulcers  Diagnosis:  Onychomycosis, , Pain in right toe, pain in left toes  Treatment & Plan Procedures and Treatment: Consent by patient was obtained for treatment procedures. The patient understood the discussion of treatment and procedures well. All questions were answered thoroughly reviewed. Debridement of mycotic and hypertrophic toenails, 1 through 5 bilateral and clearing of subungual debris. No ulceration, no infection noted.  Return Visit-Office Procedure: Patient instructed to return to the office for a follow up visit 3 months for continued evaluation and treatment. 

## 2016-06-09 ENCOUNTER — Ambulatory Visit: Payer: Medicaid Other | Admitting: Podiatry

## 2016-06-09 ENCOUNTER — Encounter: Payer: Self-pay | Admitting: Podiatry

## 2016-06-09 ENCOUNTER — Ambulatory Visit (INDEPENDENT_AMBULATORY_CARE_PROVIDER_SITE_OTHER): Payer: Medicaid Other | Admitting: Podiatry

## 2016-06-09 VITALS — Ht 60.0 in | Wt 143.0 lb

## 2016-06-09 DIAGNOSIS — M79676 Pain in unspecified toe(s): Secondary | ICD-10-CM

## 2016-06-09 DIAGNOSIS — B351 Tinea unguium: Secondary | ICD-10-CM

## 2016-06-09 DIAGNOSIS — E1140 Type 2 diabetes mellitus with diabetic neuropathy, unspecified: Secondary | ICD-10-CM

## 2016-06-09 NOTE — Progress Notes (Signed)
Patient ID: [name], female   DOB: 01/01/1956, 61 y.o.   MRN: [id number] Complaint:  Visit Type: Patient returns to my office for continued preventative foot care services. Complaint: Patient states" my nails have grown long and thick and become painful to walk and wear shoes" Patient has been diagnosed with DM with no foot complications. The patient presents for preventative foot care services. No changes to ROS  Podiatric Exam: Vascular: dorsalis pedis and posterior tibial pulses are palpable bilateral. Capillary return is immediate. Temperature gradient is WNL. Skin turgor WNL  Sensorium: Normal Semmes Weinstein monofilament test. Normal tactile sensation bilaterally. Nail Exam: Pt has thick disfigured discolored nails noted left third toe and right second toenail. Ulcer Exam: There is no evidence of ulcer or pre-ulcerative changes or infection. Orthopedic Exam: Muscle tone and strength are WNL. No limitations in general ROM. No crepitus or effusions noted. Foot type and digits show no abnormalities. Bony prominences are unremarkable. Skin: No Porokeratosis. No infection or ulcers  Diagnosis:  Onychomycosis, , Pain in right toe, pain in left toes  Treatment & Plan Procedures and Treatment: Consent by patient was obtained for treatment procedures. The patient understood the discussion of treatment and procedures well. All questions were answered thoroughly reviewed. Debridement of mycotic and hypertrophic toenails, 1 through 5 bilateral and clearing of subungual debris. No ulceration, no infection noted.  Return Visit-Office Procedure: Patient instructed to return to the office for a follow up visit 3 months for continued evaluation and treatment.    Helane GuntherGregory Jaimee Corum DPM

## 2016-09-07 ENCOUNTER — Ambulatory Visit: Payer: Medicaid Other | Admitting: Podiatry

## 2016-10-21 ENCOUNTER — Ambulatory Visit: Payer: Medicaid Other | Admitting: Podiatry

## 2017-01-18 ENCOUNTER — Encounter: Payer: Self-pay | Admitting: Podiatry

## 2017-01-18 ENCOUNTER — Ambulatory Visit (INDEPENDENT_AMBULATORY_CARE_PROVIDER_SITE_OTHER): Payer: Medicaid Other | Admitting: Podiatry

## 2017-01-18 DIAGNOSIS — M79676 Pain in unspecified toe(s): Secondary | ICD-10-CM | POA: Diagnosis not present

## 2017-01-18 DIAGNOSIS — E1140 Type 2 diabetes mellitus with diabetic neuropathy, unspecified: Secondary | ICD-10-CM

## 2017-01-18 DIAGNOSIS — B351 Tinea unguium: Secondary | ICD-10-CM

## 2017-01-18 NOTE — Progress Notes (Signed)
Patient ID: Brenda Riggs, female   DOB: 30-Apr-1956, 61 y.o.   MRN: [id number] Complaint:  Visit Type: Patient returns to my office for continued preventative foot care services. Complaint: Patient states" my nails have grown long and thick and become painful to walk and wear shoes" Patient has been diagnosed with DM with no foot complications. The patient presents for preventative foot care services. No changes to ROS  Podiatric Exam: Vascular: dorsalis pedis and posterior tibial pulses are palpable bilateral. Capillary return is immediate. Temperature gradient is WNL. Skin turgor WNL  Sensorium: Normal Semmes Weinstein monofilament test. Normal tactile sensation bilaterally. Nail Exam: Pt has thick disfigured discolored nails noted left third toe and right second toenail. Ulcer Exam: There is no evidence of ulcer or pre-ulcerative changes or infection. Orthopedic Exam: Muscle tone and strength are WNL. No limitations in general ROM. No crepitus or effusions noted. Foot type and digits show no abnormalities. Bony prominences are unremarkable. Skin: No Porokeratosis. No infection or ulcers  Diagnosis:  Onychomycosis, , Pain in right toe, pain in left toes  Treatment & Plan Procedures and Treatment: Consent by patient was obtained for treatment procedures. The patient understood the discussion of treatment and procedures well. All questions were answered thoroughly reviewed. Debridement of mycotic and hypertrophic toenails, 1 through 5 bilateral and clearing of subungual debris. No ulceration, no infection noted.  Return Visit-Office Procedure: Patient instructed to return to the office for a follow up visit 3 months for continued evaluation and treatment.    Helane Gunther DPM

## 2017-03-28 ENCOUNTER — Emergency Department (HOSPITAL_COMMUNITY): Payer: Medicaid Other

## 2017-03-28 ENCOUNTER — Emergency Department (HOSPITAL_COMMUNITY)
Admission: EM | Admit: 2017-03-28 | Discharge: 2017-03-29 | Disposition: A | Discharge status: Home / Self Care | Payer: Medicaid Other | Attending: Emergency Medicine | Admitting: Emergency Medicine

## 2017-03-28 ENCOUNTER — Encounter (HOSPITAL_COMMUNITY): Payer: Self-pay

## 2017-03-28 ENCOUNTER — Other Ambulatory Visit: Payer: Self-pay

## 2017-03-28 DIAGNOSIS — R2681 Unsteadiness on feet: Secondary | ICD-10-CM | POA: Diagnosis present

## 2017-03-28 DIAGNOSIS — M79605 Pain in left leg: Secondary | ICD-10-CM | POA: Insufficient documentation

## 2017-03-28 DIAGNOSIS — Z79899 Other long term (current) drug therapy: Secondary | ICD-10-CM | POA: Insufficient documentation

## 2017-03-28 DIAGNOSIS — F17210 Nicotine dependence, cigarettes, uncomplicated: Secondary | ICD-10-CM | POA: Insufficient documentation

## 2017-03-28 DIAGNOSIS — J45909 Unspecified asthma, uncomplicated: Secondary | ICD-10-CM | POA: Insufficient documentation

## 2017-03-28 DIAGNOSIS — R5383 Other fatigue: Secondary | ICD-10-CM | POA: Diagnosis not present

## 2017-03-28 DIAGNOSIS — J449 Chronic obstructive pulmonary disease, unspecified: Secondary | ICD-10-CM | POA: Diagnosis not present

## 2017-03-28 DIAGNOSIS — I10 Essential (primary) hypertension: Secondary | ICD-10-CM | POA: Diagnosis not present

## 2017-03-28 DIAGNOSIS — R5381 Other malaise: Secondary | ICD-10-CM | POA: Diagnosis not present

## 2017-03-28 DIAGNOSIS — N390 Urinary tract infection, site not specified: Secondary | ICD-10-CM | POA: Diagnosis not present

## 2017-03-28 DIAGNOSIS — M79604 Pain in right leg: Secondary | ICD-10-CM | POA: Diagnosis not present

## 2017-03-28 DIAGNOSIS — E119 Type 2 diabetes mellitus without complications: Secondary | ICD-10-CM | POA: Diagnosis not present

## 2017-03-28 DIAGNOSIS — Z7982 Long term (current) use of aspirin: Secondary | ICD-10-CM | POA: Diagnosis not present

## 2017-03-28 DIAGNOSIS — Z7984 Long term (current) use of oral hypoglycemic drugs: Secondary | ICD-10-CM | POA: Insufficient documentation

## 2017-03-28 LAB — URINALYSIS, ROUTINE W REFLEX MICROSCOPIC
GLUCOSE, UA: NEGATIVE mg/dL
HGB URINE DIPSTICK: NEGATIVE
Ketones, ur: 5 mg/dL — AB
NITRITE: NEGATIVE
PH: 5 (ref 5.0–8.0)
Protein, ur: NEGATIVE mg/dL
SPECIFIC GRAVITY, URINE: 1.032 — AB (ref 1.005–1.030)

## 2017-03-28 LAB — COMPREHENSIVE METABOLIC PANEL
ALBUMIN: 3.7 g/dL (ref 3.5–5.0)
ALK PHOS: 42 U/L (ref 38–126)
ALT: 51 U/L (ref 14–54)
ANION GAP: 9 (ref 5–15)
AST: 156 U/L — ABNORMAL HIGH (ref 15–41)
BILIRUBIN TOTAL: 0.4 mg/dL (ref 0.3–1.2)
BUN: 23 mg/dL — AB (ref 6–20)
CALCIUM: 9.1 mg/dL (ref 8.9–10.3)
CO2: 29 mmol/L (ref 22–32)
Chloride: 102 mmol/L (ref 101–111)
Creatinine, Ser: 0.8 mg/dL (ref 0.44–1.00)
GFR calc Af Amer: >60 mL/min (ref >60)
GLUCOSE: 130 mg/dL — AB (ref 65–99)
POTASSIUM: 3.3 mmol/L — AB (ref 3.5–5.1)
Sodium: 140 mmol/L (ref 135–145)
TOTAL PROTEIN: 6.7 g/dL (ref 6.5–8.1)

## 2017-03-28 LAB — DIFFERENTIAL
BASOS ABS: 0 10*3/uL (ref 0.0–0.1)
Basophils Relative: 0 %
EOS ABS: 0.1 10*3/uL (ref 0.0–0.7)
EOS PCT: 1 %
LYMPHS ABS: 2.3 10*3/uL (ref 0.7–4.0)
LYMPHS PCT: 34 %
MONOS PCT: 13 %
Monocytes Absolute: 0.9 10*3/uL (ref 0.1–1.0)
Neutro Abs: 3.5 10*3/uL (ref 1.7–7.7)
Neutrophils Relative %: 52 %

## 2017-03-28 LAB — I-STAT CHEM 8, ED
BUN: 24 mg/dL — ABNORMAL HIGH (ref 6–20)
CHLORIDE: 100 mmol/L — AB (ref 101–111)
Calcium, Ion: 1.22 mmol/L (ref 1.15–1.40)
Creatinine, Ser: 0.8 mg/dL (ref 0.44–1.00)
Glucose, Bld: 129 mg/dL — ABNORMAL HIGH (ref 65–99)
HCT: 33 % — ABNORMAL LOW (ref 36.0–46.0)
Hemoglobin: 11.2 g/dL — ABNORMAL LOW (ref 12.0–15.0)
POTASSIUM: 3.3 mmol/L — AB (ref 3.5–5.1)
SODIUM: 141 mmol/L (ref 135–145)
TCO2: 29 mmol/L (ref 22–32)

## 2017-03-28 LAB — CBC
HCT: 32.5 % — ABNORMAL LOW (ref 36.0–46.0)
Hemoglobin: 10.8 g/dL — ABNORMAL LOW (ref 12.0–15.0)
MCH: 31.6 pg (ref 26.0–34.0)
MCHC: 33.2 g/dL (ref 30.0–36.0)
MCV: 95 fL (ref 78.0–100.0)
PLATELETS: 298 10*3/uL (ref 150–400)
RBC: 3.42 MIL/uL — ABNORMAL LOW (ref 3.87–5.11)
RDW: 13.9 % (ref 11.5–15.5)
WBC: 6.7 10*3/uL (ref 4.0–10.5)

## 2017-03-28 LAB — I-STAT TROPONIN, ED: TROPONIN I, POC: 0.02 ng/mL (ref 0.00–0.08)

## 2017-03-28 LAB — RAPID URINE DRUG SCREEN, HOSP PERFORMED
Amphetamines: NOT DETECTED
BARBITURATES: NOT DETECTED
Benzodiazepines: NOT DETECTED
Cocaine: NOT DETECTED
OPIATES: NOT DETECTED
TETRAHYDROCANNABINOL: NOT DETECTED

## 2017-03-28 LAB — PROTIME-INR
INR: 1.01
PROTHROMBIN TIME: 13.2 s (ref 11.4–15.2)

## 2017-03-28 LAB — APTT: APTT: 27 s (ref 24–36)

## 2017-03-28 LAB — ETHANOL

## 2017-03-28 MED ORDER — CEPHALEXIN 500 MG PO CAPS: 500.0000 mg | ORAL_CAPSULE | Freq: Four times a day (QID) | ORAL | 0 refills | Status: DC

## 2017-03-28 NOTE — ED Triage Notes (Signed)
Patient went to Dr. Julio Sicksosei Bonsu today for aching legs, balance is off and aching all over. Patient's family reports that her PCP wanted testing done and was suppose to call.

## 2017-03-28 NOTE — ED Notes (Signed)
Pt in Ct  

## 2017-03-28 NOTE — ED Provider Notes (Signed)
[facility]-EMERGENCY DEPT Provider Note   CSN: [id number] Arrival date & time: 03/28/17  1804     History   Chief Complaint Chief Complaint  Patient presents with  . Fall    HPI Brenda BannisterGloria J Riggs is a 61 y.o. female.  She is here with her caretaker from her rest home for evaluation of "balance problems."  The patient typically needs help to walk and uses a walker.  For the last 2 days she has been having difficulty with her balance when sitting.  Sometimes she leans to the left and sometimes she leans to the right.  She has had some falls, last being today, it was without injury.  Commonly she slipped out of a chair and sags to the floor, when she falls.  There is been no headache, back pain, or arm pain.  She has ongoing chronic leg pain, bilaterally.  She was able to walk today after the most recent fall.  Her appetite has been good.  There is been no fever, cough, vomiting, change in bowel or urinary habits.  The patient saw her PCP today who suggested that she come to the emergency department for further evaluation and treatment.  According to the caregiver with her, the patient typically gets like this, when she is due for her Prolixin shot, which she gets every 2 weeks.  She is due to get a Prolixin shot, tomorrow morning.  There are no other known modifying factors.    HPI  Past Medical History:  Diagnosis Date  . Allergy   . Asthma   . Blood transfusion without reported diagnosis   . Chronic kidney disease   . COPD (chronic obstructive pulmonary disease) (HCC)   . Depression   . Diabetes mellitus without complication (HCC)   . Hyperlipidemia   . Hypertension   . Stroke [facility](HCC)     Patient Active Problem List   Diagnosis Date Noted  . OSA (obstructive sleep apnea) 07/01/2014  . Cigarette smoker 06/28/2014  . Wheezing 06/27/2014  . MENOPAUSE, SURGICAL 09/09/2009  . INSECT BITE 12/19/2008  . Essential hypertension, benign 09/26/2008  . DIABETES  MELLITUS, TYPE II 11/02/2006  . 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 TOBACCO USER 11/02/2006  . MENTAL RETARDATION, MILD 11/02/2006  . ASTHMA 11/02/2006  . HIDRADENITIS SUPPURATIVA 11/02/2006  . HYPERLIPIDEMIA 12/21/2004  . 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 STROKE 10/06/2004  . ATAXIA 08/21/2004  . SYMPTOM, HYPERSOMNIA W/SLEEP APNEA NOS 11/09/1999  . BREAST MASS, LEFT 08/18/1998    Past Surgical History:  Procedure Laterality Date  . ABDOMINAL HYSTERECTOMY    . APPENDECTOMY    . CESAREAN SECTION    . TUBAL LIGATION      OB History    No data available       Home Medications    Prior to Admission medications   Medication Sig Start Date End Date Taking? Authorizing Provider  acetaminophen (TYLENOL) 325 MG tablet Take 650 mg by mouth every 6 (six) hours as needed.   Yes [provider]  albuterol (PROVENTIL HFA;VENTOLIN HFA) 108 (90 BASE) MCG/ACT inhaler Inhale 2 puffs into the lungs every 6 (six) hours as needed for wheezing or shortness of breath.   Yes [provider]  aspirin EC 81 MG tablet Take 81 mg by mouth daily.   Yes [provider]  beclomethasone (QVAR) 40 MCG/ACT inhaler Inhale 2 puffs into the lungs 2 (two) times daily.   Yes [provider]  cholecalciferol (VITAMIN D)  1000 UNITS tablet Take 1,000 Units by mouth daily.   Yes [provider]  divalproex (DEPAKOTE) 250 MG DR tablet Take 250 mg by mouth 2 (two) times daily.   Yes [provider]  fluPHENAZine (PROLIXIN) 5 MG tablet Take 10 mg by mouth at bedtime.   Yes [provider]  fluPHENAZine (PROLIXIN) 5 MG/ML solution 2 ml IM every 2 wks   Yes [provider]  fluticasone (FLONASE) 50 MCG/ACT nasal spray Place 1 spray into both nostrils daily.   Yes [provider]  gabapentin (NEURONTIN) 300 MG capsule Take 300 mg by mouth 2 (two) times daily.   Yes [provider]  LORazepam (ATIVAN) 1 MG tablet Take 0.5 mg  by mouth daily as needed for anxiety.    Yes [provider]  losartan (COZAAR) 25 MG tablet Take 25 mg by mouth daily.   Yes [provider]  metFORMIN (GLUCOPHAGE) 500 MG tablet Take 500 mg by mouth 2 (two) times daily with a meal.   Yes [provider]  metoprolol (LOPRESSOR) 50 MG tablet Take 50 mg by mouth daily.    Yes [provider]  naproxen (NAPROSYN) 250 MG tablet Take 250 mg by mouth 2 (two) times daily with a meal.   Yes [provider]  cephALEXin (KEFLEX) 500 MG capsule Take 1 capsule (500 mg total) by mouth 4 (four) times daily. 03/28/17   Brenda Riggs, Griselda Bramblett, MD    Family History Family History  Problem Relation Age of Onset  . Heart disease Mother     Social History Social History   Tobacco Use  . Smoking status: Current Some Day Smoker    Packs/day: 0.10    Years: 41.00    Pack years: 4.10    Types: Cigarettes  . Smokeless tobacco: Never Used  Substance Use Topics  . Alcohol use: No    Alcohol/week: 0.0 oz  . Drug use: No     Allergies   Penicillins and Thioridazine hcl   Review of Systems Review of Systems  All other systems reviewed and are negative.    Physical Exam Updated Vital Signs BP (!) 149/65 (BP Location: Left Arm)   Pulse 85   Temp 98.4 F (36.9 C) (Oral)   Resp 14   Ht 4\' 9"  (1.448 m)   Wt 61.2 kg (135 lb)   SpO2 100%   BMI 29.21 kg/m   Physical Exam  Constitutional: She appears well-developed. She appears distressed.  She appears older than stated age.  HENT:  Head: Normocephalic and atraumatic.  Eyes: Conjunctivae and EOM are normal. Pupils are equal, round, and reactive to light.  Neck: Normal range of motion and phonation normal. Neck supple.  Cardiovascular: Normal rate and regular rhythm.  Pulmonary/Chest: Effort normal and breath sounds normal. She exhibits no tenderness.  Abdominal: Soft. She exhibits no distension. There is no tenderness. There is no guarding.    Musculoskeletal: Normal range of motion.  Neurological: She is alert. She exhibits normal muscle tone.  Dysarthric, reportedly at baseline by the caregiver with her.  Skin: Skin is warm and dry.  Psychiatric:  She appears depressed  Nursing note and vitals reviewed.    ED Treatments / Results  Labs (all labs ordered are listed, but only abnormal results are displayed) Labs Reviewed  CBC - Abnormal; Notable for the following components:      Result Value   RBC 3.42 (*)    Hemoglobin 10.8 (*)    HCT  32.5 (*)    All other components within normal limits  COMPREHENSIVE METABOLIC PANEL - Abnormal; Notable for the following components:   Potassium 3.3 (*)    Glucose, Bld 130 (*)    BUN 23 (*)    AST 156 (*)    All other components within normal limits  URINALYSIS, ROUTINE W REFLEX MICROSCOPIC - Abnormal; Notable for the following components:   Specific Gravity, Urine 1.032 (*)    Bilirubin Urine SMALL (*)    Ketones, ur 5 (*)    Leukocytes, UA MODERATE (*)    Bacteria, UA RARE (*)    Squamous Epithelial / LPF 0-5 (*)    All other components within normal limits  I-STAT CHEM 8, ED - Abnormal; Notable for the following components:   Potassium 3.3 (*)    Chloride 100 (*)    BUN 24 (*)    Glucose, Bld 129 (*)    Hemoglobin 11.2 (*)    HCT 33.0 (*)    All other components within normal limits  URINE CULTURE  ETHANOL  PROTIME-INR  APTT  DIFFERENTIAL  RAPID URINE DRUG SCREEN, HOSP PERFORMED  I-STAT TROPONIN, ED    EKG  EKG Interpretation  Date/Time:  Tuesday March 28 2017 22:12:57 EST Ventricular Rate:  80 PR Interval:    QRS Duration: 114 QT Interval:  445 QTC Calculation: 514 R Axis:   39 Text Interpretation:  Sinus rhythm Borderline intraventricular conduction delay Abnormal T, consider ischemia, diffuse leads Prolonged QT interval Baseline wander in lead(s) V1 Since last tracing QT has lengthened Confirmed by Brenda Bale [phone]) on 03/28/2017 11:13:12 PM        Radiology Ct Head Wo Contrast  Result Date: 03/28/2017 CLINICAL DATA:  Altered level of consciousness. Gait imbalance, extremity weakness. EXAM: CT HEAD WITHOUT CONTRAST TECHNIQUE: Contiguous axial images were obtained from the base of the skull through the vertex without intravenous contrast. COMPARISON:  CT HEAD October 22, 2009 FINDINGS: BRAIN: No intraparenchymal hemorrhage, mass effect nor midline shift. Moderate ventriculomegaly on the basis of global parenchymal brain volume loss, increased from prior CT. No hydrocephalus. Confluent supratentorial white matter hypodensities. Multiple old pontine in midbrain lacunar infarcts. Chronic appearing bilateral thalamus lacunar infarcts. No acute large vascular territory infarcts. No abnormal extra-axial fluid collections. Basal cisterns are patent. VASCULAR: Mild calcific atherosclerosis of the carotid siphons. SKULL: No skull fracture. Partially empty sella. No significant scalp soft tissue swelling. SINUSES/ORBITS: The mastoid air-cells and included paranasal sinuses are well-aerated.The included ocular globes and orbital contents are non-suspicious. OTHER: None. IMPRESSION: 1. No acute intracranial process. 2. Progressed moderate parenchymal brain volume loss, advanced for age. 3. Progressed moderate chronic small vessel ischemic disease, advanced for age. Multiple old lacunar infarcts. Electronically Signed   By: Awilda Metro M.D.   On: 03/28/2017 22:07    Procedures Procedures (including critical care time)  Medications Ordered in ED Medications - No data to display   Initial Impression / Assessment and Plan / ED Course  I have reviewed the triage vital signs and the nursing notes.  Pertinent labs & imaging results that were available during my care of the patient were reviewed by me and considered in my medical decision making (see chart for details).      Patient Vitals for the past 24 hrs:  BP Temp Temp src Pulse Resp SpO2  Height Weight  03/28/17 2225 (!) 149/65 98.4 F (36.9 C) Oral 85 14 100 % - -  03/28/17 2221 - 98.4 F (36.9  C) - - - - - -  03/28/17 2154 - - - - - 99 % - -  03/28/17 1836 - - - - - - 4\' 9"  (1.448 m) 61.2 kg (135 lb)  03/28/17 1830 128/62 98.2 F (36.8 C) Oral 77 16 99 % - -    11:45 PM Reevaluation with update and discussion. After initial assessment and treatment, an updated evaluation reveals no change in clinical status.  She continues to be alert and near her baseline according to her caregiver. Brenda Bale      Final Clinical Impressions(s) / ED Diagnoses   Final diagnoses:  Malaise and fatigue  Urinary tract infection without hematuria, site unspecified   Chronic balance disorder, mildly worse recently, last 2 days, with reassuring evaluation here.  Doubt CVA, metabolic instability or impending vascular collapse. Urinalysis abnormal, possibly consistent with UTI.  Urine culture sent.  Empiric antibiotics begun   Nursing Notes Reviewed/ Care Coordinated Applicable Imaging Reviewed Interpretation of Laboratory Data incorporated into ED treatment  The patient appears reasonably screened and/or stabilized for discharge and I doubt any other medical condition or other [facility] requiring further screening, evaluation, or treatment in the ED at this time prior to discharge.  Plan: Home Medications-continue current medications; Home Treatments-rest, fluids; return here if the recommended treatment, does not improve the symptoms; Recommended follow up-PCP checkup 3-5 days and as needed.    ED Discharge Orders        Ordered    cephALEXin (KEFLEX) 500 MG capsule  4 times daily     03/28/17 2350       Brenda Bale, MD 03/28/17 2351

## 2017-03-28 NOTE — Discharge Instructions (Signed)
Make sure that she is drinking plenty of fluids.  Start the antibiotic prescription in the morning.  Follow-up with your doctor for a checkup in 3-5 days.

## 2017-03-30 LAB — URINE CULTURE

## 2017-03-31 ENCOUNTER — Encounter (HOSPITAL_COMMUNITY): Payer: Self-pay

## 2017-03-31 ENCOUNTER — Emergency Department (HOSPITAL_COMMUNITY)
Admission: EM | Admit: 2017-03-31 | Discharge: 2017-03-31 | Disposition: A | Discharge status: Home / Self Care | Payer: Medicaid Other | Attending: Emergency Medicine | Admitting: Emergency Medicine

## 2017-03-31 ENCOUNTER — Emergency Department (HOSPITAL_COMMUNITY): Payer: Medicaid Other

## 2017-03-31 DIAGNOSIS — Y999 Unspecified external cause status: Secondary | ICD-10-CM | POA: Diagnosis not present

## 2017-03-31 DIAGNOSIS — S7002XA Contusion of left hip, initial encounter: Secondary | ICD-10-CM

## 2017-03-31 DIAGNOSIS — Y939 Activity, unspecified: Secondary | ICD-10-CM | POA: Insufficient documentation

## 2017-03-31 DIAGNOSIS — F17210 Nicotine dependence, cigarettes, uncomplicated: Secondary | ICD-10-CM | POA: Insufficient documentation

## 2017-03-31 DIAGNOSIS — Z8673 Personal history of transient ischemic attack (TIA), and cerebral infarction without residual deficits: Secondary | ICD-10-CM | POA: Diagnosis not present

## 2017-03-31 DIAGNOSIS — R531 Weakness: Secondary | ICD-10-CM | POA: Diagnosis not present

## 2017-03-31 DIAGNOSIS — I10 Essential (primary) hypertension: Secondary | ICD-10-CM | POA: Diagnosis not present

## 2017-03-31 DIAGNOSIS — Y92129 Unspecified place in nursing home as the place of occurrence of the external cause: Secondary | ICD-10-CM | POA: Insufficient documentation

## 2017-03-31 DIAGNOSIS — S79812A Other specified injuries of left hip, initial encounter: Secondary | ICD-10-CM | POA: Diagnosis present

## 2017-03-31 DIAGNOSIS — Z7982 Long term (current) use of aspirin: Secondary | ICD-10-CM | POA: Insufficient documentation

## 2017-03-31 DIAGNOSIS — Z7984 Long term (current) use of oral hypoglycemic drugs: Secondary | ICD-10-CM | POA: Insufficient documentation

## 2017-03-31 DIAGNOSIS — W1830XA Fall on same level, unspecified, initial encounter: Secondary | ICD-10-CM | POA: Insufficient documentation

## 2017-03-31 DIAGNOSIS — J449 Chronic obstructive pulmonary disease, unspecified: Secondary | ICD-10-CM | POA: Insufficient documentation

## 2017-03-31 DIAGNOSIS — E119 Type 2 diabetes mellitus without complications: Secondary | ICD-10-CM | POA: Diagnosis not present

## 2017-03-31 LAB — URINALYSIS, ROUTINE W REFLEX MICROSCOPIC
Bacteria, UA: NONE SEEN
Bilirubin Urine: NEGATIVE
GLUCOSE, UA: NEGATIVE mg/dL
Hgb urine dipstick: NEGATIVE
Ketones, ur: NEGATIVE mg/dL
Leukocytes, UA: NEGATIVE
Nitrite: NEGATIVE
PH: 6 (ref 5.0–8.0)
Protein, ur: 30 mg/dL — AB
SPECIFIC GRAVITY, URINE: 1.023 (ref 1.005–1.030)

## 2017-03-31 LAB — COMPREHENSIVE METABOLIC PANEL
ALBUMIN: 3 g/dL — AB (ref 3.5–5.0)
ALK PHOS: 37 U/L — AB (ref 38–126)
ALT: 33 U/L (ref 14–54)
AST: 52 U/L — AB (ref 15–41)
Anion gap: 8 (ref 5–15)
BILIRUBIN TOTAL: 0.4 mg/dL (ref 0.3–1.2)
BUN: 15 mg/dL (ref 6–20)
CALCIUM: 8.3 mg/dL — AB (ref 8.9–10.3)
CO2: 25 mmol/L (ref 22–32)
Chloride: 108 mmol/L (ref 101–111)
Creatinine, Ser: 0.73 mg/dL (ref 0.44–1.00)
GFR calc Af Amer: >60 mL/min (ref >60)
GLUCOSE: 146 mg/dL — AB (ref 65–99)
POTASSIUM: 3.3 mmol/L — AB (ref 3.5–5.1)
Sodium: 141 mmol/L (ref 135–145)
TOTAL PROTEIN: 5.5 g/dL — AB (ref 6.5–8.1)

## 2017-03-31 LAB — CBC WITH DIFFERENTIAL/PLATELET
BASOS PCT: 0 %
Basophils Absolute: 0 10*3/uL (ref 0.0–0.1)
EOS ABS: 0.1 10*3/uL (ref 0.0–0.7)
Eosinophils Relative: 1 %
HEMATOCRIT: 28.8 % — AB (ref 36.0–46.0)
Hemoglobin: 9.6 g/dL — ABNORMAL LOW (ref 12.0–15.0)
Lymphocytes Relative: 22 %
Lymphs Abs: 1.6 10*3/uL (ref 0.7–4.0)
MCH: 31.7 pg (ref 26.0–34.0)
MCHC: 33.3 g/dL (ref 30.0–36.0)
MCV: 95 fL (ref 78.0–100.0)
MONO ABS: 0.6 10*3/uL (ref 0.1–1.0)
MONOS PCT: 8 %
NEUTROS ABS: 5.1 10*3/uL (ref 1.7–7.7)
Neutrophils Relative %: 69 %
Platelets: 248 10*3/uL (ref 150–400)
RBC: 3.03 MIL/uL — ABNORMAL LOW (ref 3.87–5.11)
RDW: 13.9 % (ref 11.5–15.5)
WBC: 7.4 10*3/uL (ref 4.0–10.5)

## 2017-03-31 LAB — POC OCCULT BLOOD, ED: Fecal Occult Bld: NEGATIVE

## 2017-03-31 NOTE — ED Notes (Signed)
Patient and daughter verbalized understanding of discharge paperwork and follow up instructions.

## 2017-03-31 NOTE — ED Provider Notes (Signed)
[facility]   CSN: [id number] Arrival date & time: 03/31/17  1740     History   Chief Complaint Chief Complaint  Patient presents with  . Weakness    HPI Brenda Riggs is a 61 y.o. female.  HPI Patient was seen 3 days ago for multiple falls and balance issues.  She states she continues to have falls.  Last one was this evening.  She has history of schizophrenia and has difficulty giving detailed history.  She does report some left hip pain.  She has not walked since her fall this evening.  Denies head injury or loss of consciousness.  Patient was diagnosed with UTI and started on antibiotics.  Currently denies any fever or urinary symptoms. Past Medical History:  Diagnosis Date  . Allergy   . Asthma   . Blood transfusion without reported diagnosis   . Chronic kidney disease   . COPD (chronic obstructive pulmonary disease) (HCC)   . Depression   . Diabetes mellitus without complication (HCC)   . Hyperlipidemia   . Hypertension   . Stroke [facility](HCC)     Patient Active Problem List   Diagnosis Date Noted  . OSA (obstructive sleep apnea) 07/01/2014  . Cigarette smoker 06/28/2014  . Wheezing 06/27/2014  . MENOPAUSE, SURGICAL 09/09/2009  . INSECT BITE 12/19/2008  . Essential hypertension, benign 09/26/2008  . DIABETES MELLITUS, TYPE II 11/02/2006  . 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 TOBACCO USER 11/02/2006  . MENTAL RETARDATION, MILD 11/02/2006  . ASTHMA 11/02/2006  . HIDRADENITIS SUPPURATIVA 11/02/2006  . HYPERLIPIDEMIA 12/21/2004  . 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 STROKE 10/06/2004  . ATAXIA 08/21/2004  . SYMPTOM, HYPERSOMNIA W/SLEEP APNEA NOS 11/09/1999  . BREAST MASS, LEFT 08/18/1998    Past Surgical History:  Procedure Laterality Date  . ABDOMINAL HYSTERECTOMY    . APPENDECTOMY    . CESAREAN SECTION    . TUBAL LIGATION      OB History    No data available       Home Medications     Prior to Admission medications   Medication Sig Start Date End Date Taking? Authorizing Provider  acetaminophen (TYLENOL) 325 MG tablet Take 650 mg by mouth every 6 (six) hours as needed.    [provider]  albuterol (PROVENTIL HFA;VENTOLIN HFA) 108 (90 BASE) MCG/ACT inhaler Inhale 2 puffs into the lungs every 6 (six) hours as needed for wheezing or shortness of breath.    [provider]  aspirin EC 81 MG tablet Take 81 mg by mouth daily.    [provider]  beclomethasone (QVAR) 40 MCG/ACT inhaler Inhale 2 puffs into the lungs 2 (two) times daily.    [provider]  cephALEXin (KEFLEX) 500 MG capsule Take 1 capsule (500 mg total) by mouth 4 (four) times daily. 03/28/17   Mancel BaleWentz, Elliott, MD  cholecalciferol (VITAMIN D) 1000 UNITS tablet Take 1,000 Units by mouth daily.    [provider]  divalproex (DEPAKOTE) 250 MG DR tablet Take 250 mg by mouth 2 (two) times daily.    [provider]  fluPHENAZine (PROLIXIN) 5 MG tablet Take 10 mg by mouth at bedtime.    [provider]  fluPHENAZine (PROLIXIN) 5 MG/ML solution 2 ml IM every 2 wks    [provider]  fluticasone (FLONASE) 50 MCG/ACT nasal spray Place 1 spray into both nostrils daily.    [provider]  gabapentin (NEURONTIN) 300 MG capsule Take  300 mg by mouth 2 (two) times daily.    [provider]  LORazepam (ATIVAN) 1 MG tablet Take 0.5 mg by mouth daily as needed for anxiety.     [provider]  losartan (COZAAR) 25 MG tablet Take 25 mg by mouth daily.    [provider]  metFORMIN (GLUCOPHAGE) 500 MG tablet Take 500 mg by mouth 2 (two) times daily with a meal.    [provider]  metoprolol (LOPRESSOR) 50 MG tablet Take 50 mg by mouth daily.     [provider]  naproxen (NAPROSYN) 250 MG tablet Take 250 mg by mouth 2 (two) times daily with a meal.    [provider]    Family History Family  History  Problem Relation Age of Onset  . Heart disease Mother     Social History Social History   Tobacco Use  . Smoking status: Current Some Day Smoker    Packs/day: 0.10    Years: 41.00    Pack years: 4.10    Types: Cigarettes  . Smokeless tobacco: Never Used  Substance Use Topics  . Alcohol use: No    Alcohol/week: 0.0 oz  . Drug use: No     Allergies   Penicillins and Thioridazine hcl   Review of Systems Review of Systems  Constitutional: Negative for chills and fever.  Respiratory: Negative for cough and shortness of breath.   Cardiovascular: Negative for chest pain.  Gastrointestinal: Negative for abdominal pain, diarrhea, nausea and vomiting.  Genitourinary: Negative for dysuria, flank pain and frequency.  Musculoskeletal: Positive for arthralgias and gait problem. Negative for back pain and neck pain.  Skin: Negative for rash and wound.  Neurological: Positive for dizziness and weakness. Negative for syncope, numbness and headaches.  All other systems reviewed and are negative.    Physical Exam Updated Vital Signs BP (!) 148/69   Pulse 87   Temp 98.3 F (36.8 C) (Oral)   Resp 13   SpO2 100%   Physical Exam  Constitutional: She appears well-developed and well-nourished.  HENT:  Head: Normocephalic and atraumatic.  Mouth/Throat: Oropharynx is clear and moist.  No evidence of head injury.  No intraoral trauma.  Eyes: EOM are normal. Pupils are equal, round, and reactive to light.  Neck: Normal range of motion. Neck supple.  No posterior midline cervical tenderness to palpation.  Cardiovascular: Normal rate and regular rhythm. Exam reveals no gallop and no friction rub.  No murmur heard. Pulmonary/Chest: Effort normal and breath sounds normal. No stridor. No respiratory distress. She has no wheezes. She has no rales. She exhibits no tenderness.  Abdominal: Soft. Bowel sounds are normal. There is no tenderness. There is no rebound and no guarding.   Musculoskeletal: Normal range of motion. She exhibits tenderness. She exhibits no edema.  Patient has tenderness to palpation of the left hip.  Decreased range of motion of left hip due to pain.  Neurological: She is alert.  Slow to answer questioning but alert.  Appears to be moving all extremities without focal deficit.  She does have decreased range of motion of the left hip due to pain.  Sensation is grossly intact.  Skin: Skin is warm and dry. Capillary refill takes less than 2 seconds. No rash noted. No erythema.  Nursing note and vitals reviewed.    ED Treatments / Results  Labs (all labs ordered are listed, but only abnormal results are displayed) Labs Reviewed  CBC WITH DIFFERENTIAL/PLATELET - Abnormal; Notable for  the following components:      Result Value   RBC 3.03 (*)    Hemoglobin 9.6 (*)    HCT 28.8 (*)    All other components within normal limits  COMPREHENSIVE METABOLIC PANEL - Abnormal; Notable for the following components:   Potassium 3.3 (*)    Glucose, Bld 146 (*)    Calcium 8.3 (*)    Total Protein 5.5 (*)    Albumin 3.0 (*)    AST 52 (*)    Alkaline Phosphatase 37 (*)    All other components within normal limits  URINALYSIS, ROUTINE W REFLEX MICROSCOPIC - Abnormal; Notable for the following components:   APPearance HAZY (*)    Protein, ur 30 (*)    Squamous Epithelial / LPF 0-5 (*)    All other components within normal limits  POC OCCULT BLOOD, ED    EKG  EKG Interpretation None       Radiology Dg Hip Unilat W Or Wo Pelvis 2-3 Views Left  Result Date: 03/31/2017 CLINICAL DATA:  Fall 2-3 days ago. Left hip pain. Initial encounter. EXAM: DG HIP (WITH OR WITHOUT PELVIS) 2-3V LEFT COMPARISON:  None. FINDINGS: There is subcutaneous fat reticulation lateral to the left hip. No underlying fracture is seen. No dislocation. Atherosclerotic calcification. IMPRESSION: Contusion lateral to the left hip.  No acute osseous finding. Electronically Signed    By: Marnee SpringJonathon  Watts M.D.   On: 03/31/2017 19:48    Procedures Procedures (including critical care time)  Medications Ordered in ED Medications - No data to display   Initial Impression / Assessment and Plan / ED Course  I have reviewed the triage vital signs and the nursing notes.  Pertinent labs & imaging results that were available during my care of the patient were reviewed by me and considered in my medical decision making (see chart for details).     Vital signs remained stable.  Patient is roughly at her baseline mental status.  UA appears to be improving without definite evidence of UTI.  Patient likely has some degree of deconditioning.  Advised to follow-up closely with her primary physician.  Return precautions given.  Final Clinical Impressions(s) / ED Diagnoses   Final diagnoses:  Generalized weakness  Contusion of left hip, initial encounter    ED Discharge Orders    None       Loren RacerYelverton, Leighton Luster, MD 03/31/17 2209

## 2017-03-31 NOTE — ED Notes (Signed)
Attempted to call report to facility and got no answer.

## 2017-03-31 NOTE — ED Notes (Signed)
Patient transported to X-ray 

## 2017-03-31 NOTE — ED Triage Notes (Signed)
Pt brought in by [facility] from a group home for generalized weakness x3 days. Pt was seen at [facility] on 11/27 and dx with a UTI. Pt has been on antibiotics x2 days for the UTI however per facility pt is not ambulating or holding conversation per her usual. Pt is A+Ox4 but slow to respond. Per facility pt ambulates with a walker, but has not been "getting up much" since returning from the hospital.

## 2017-04-19 ENCOUNTER — Ambulatory Visit: Payer: Medicaid Other | Admitting: Podiatry

## 2017-08-25 ENCOUNTER — Ambulatory Visit (INDEPENDENT_AMBULATORY_CARE_PROVIDER_SITE_OTHER): Payer: Medicaid Other | Admitting: Podiatry

## 2017-08-25 ENCOUNTER — Encounter: Payer: Self-pay | Admitting: Podiatry

## 2017-08-25 DIAGNOSIS — B351 Tinea unguium: Secondary | ICD-10-CM

## 2017-08-25 DIAGNOSIS — M79676 Pain in unspecified toe(s): Secondary | ICD-10-CM | POA: Diagnosis not present

## 2017-08-25 DIAGNOSIS — E1140 Type 2 diabetes mellitus with diabetic neuropathy, unspecified: Secondary | ICD-10-CM

## 2017-08-25 NOTE — Progress Notes (Signed)
Patient ID: [name], female   DOB: 02/06/1956, 62 y.o.   MRN: [id number] Complaint:  Visit Type: Patient returns to my office for continued preventative foot care services. Complaint: Patient states" my nails have grown long and thick and become painful to walk and wear shoes" Patient has been diagnosed with DM with no foot complications. The patient presents for preventative foot care services. No changes to ROS  Podiatric Exam: Vascular: dorsalis pedis and posterior tibial pulses are palpable bilateral. Capillary return is immediate. Temperature gradient is WNL. Skin turgor WNL  Sensorium: Normal Semmes Weinstein monofilament test. Normal tactile sensation bilaterally. Nail Exam: Pt has thick disfigured discolored nails noted left third toe and right second toenail. Ulcer Exam: There is no evidence of ulcer or pre-ulcerative changes or infection. Orthopedic Exam: Muscle tone and strength are WNL. No limitations in general ROM. No crepitus or effusions noted. Foot type and digits show no abnormalities. Bony prominences are unremarkable. Skin: No Porokeratosis. No infection or ulcers  Diagnosis:  Onychomycosis, , Pain in right toe, pain in left toes  Treatment & Plan Procedures and Treatment: Consent by patient was obtained for treatment procedures. The patient understood the discussion of treatment and procedures well. All questions were answered thoroughly reviewed. Debridement of mycotic and hypertrophic toenails, 1 through 5 bilateral and clearing of subungual debris. No ulceration, no infection noted.  Return Visit-Office Procedure: Patient instructed to return to the office for a follow up visit 3 months for continued evaluation and treatment.    Helane GuntherGregory Tavarious Freel DPM

## 2017-10-27 ENCOUNTER — Ambulatory Visit: Payer: Medicaid Other | Admitting: Podiatry

## 2017-11-17 ENCOUNTER — Encounter: Payer: Self-pay | Admitting: Podiatry

## 2017-11-17 ENCOUNTER — Ambulatory Visit (INDEPENDENT_AMBULATORY_CARE_PROVIDER_SITE_OTHER): Payer: Medicaid Other | Admitting: Podiatry

## 2017-11-17 DIAGNOSIS — B351 Tinea unguium: Secondary | ICD-10-CM

## 2017-11-17 DIAGNOSIS — E1140 Type 2 diabetes mellitus with diabetic neuropathy, unspecified: Secondary | ICD-10-CM

## 2017-11-17 DIAGNOSIS — M79676 Pain in unspecified toe(s): Secondary | ICD-10-CM | POA: Diagnosis not present

## 2017-11-17 NOTE — Progress Notes (Addendum)
Patient ID: Brenda BannisterGloria J Riggs, female   DOB: 07/26/1955, 62 y.o.   MRN: [id number] Complaint:  Visit Type: Patient returns to my office for continued preventative foot care services. Complaint: Patient states" my nails have grown long and thick and become painful to walk and wear shoes" Patient has been diagnosed with DM with no foot complications. The patient presents for preventative foot care services. No changes to ROS  Podiatric Exam: Vascular: dorsalis pedis and posterior tibial pulses are palpable bilateral. Capillary return is immediate. Temperature gradient is WNL. Skin turgor WNL  Sensorium: Normal Semmes Weinstein monofilament test. Normal tactile sensation bilaterally. Nail Exam: Pt has thick disfigured discolored nails noted left third toe and right second toenail. Ulcer Exam: There is no evidence of ulcer or pre-ulcerative changes or infection. Orthopedic Exam: Muscle tone and strength are WNL. No limitations in general ROM. No crepitus or effusions noted. Foot type and digits show no abnormalities. Bony prominences are unremarkable. Skin: No Porokeratosis. No infection or ulcers  Diagnosis:  Onychomycosis, , Pain in right toe, pain in left toes  Treatment & Plan Procedures and Treatment: Consent by patient was obtained for treatment procedures. The patient understood the discussion of treatment and procedures well. All questions were answered thoroughly reviewed. Debridement of mycotic and hypertrophic toenails, 1 through 5 bilateral and clearing of subungual debris. No ulceration, no infection noted.  ABN signed for 2019. Return Visit-Office Procedure: Patient instructed to return to the office for a follow up visit 3 months for continued evaluation and treatment.    Helane GuntherGregory Trysten Bernard DPM

## 2018-02-23 ENCOUNTER — Ambulatory Visit: Payer: Medicaid Other | Admitting: Podiatry

## 2018-02-25 ENCOUNTER — Emergency Department (HOSPITAL_COMMUNITY): Payer: Medicaid Other

## 2018-02-25 ENCOUNTER — Other Ambulatory Visit: Payer: Self-pay

## 2018-02-25 ENCOUNTER — Encounter (HOSPITAL_COMMUNITY): Payer: Self-pay | Admitting: Emergency Medicine

## 2018-02-25 ENCOUNTER — Emergency Department (HOSPITAL_COMMUNITY)
Admission: EM | Admit: 2018-02-25 | Discharge: 2018-02-25 | Disposition: A | Discharge status: Home / Self Care | Payer: Medicaid Other | Attending: Emergency Medicine | Admitting: Emergency Medicine

## 2018-02-25 DIAGNOSIS — N3000 Acute cystitis without hematuria: Secondary | ICD-10-CM | POA: Diagnosis not present

## 2018-02-25 DIAGNOSIS — Z7984 Long term (current) use of oral hypoglycemic drugs: Secondary | ICD-10-CM | POA: Insufficient documentation

## 2018-02-25 DIAGNOSIS — E1122 Type 2 diabetes mellitus with diabetic chronic kidney disease: Secondary | ICD-10-CM | POA: Diagnosis not present

## 2018-02-25 DIAGNOSIS — N189 Chronic kidney disease, unspecified: Secondary | ICD-10-CM | POA: Diagnosis not present

## 2018-02-25 DIAGNOSIS — W1812XA Fall from or off toilet with subsequent striking against object, initial encounter: Secondary | ICD-10-CM | POA: Diagnosis not present

## 2018-02-25 DIAGNOSIS — F17210 Nicotine dependence, cigarettes, uncomplicated: Secondary | ICD-10-CM | POA: Insufficient documentation

## 2018-02-25 DIAGNOSIS — R531 Weakness: Secondary | ICD-10-CM | POA: Diagnosis not present

## 2018-02-25 DIAGNOSIS — J449 Chronic obstructive pulmonary disease, unspecified: Secondary | ICD-10-CM | POA: Insufficient documentation

## 2018-02-25 DIAGNOSIS — I129 Hypertensive chronic kidney disease with stage 1 through stage 4 chronic kidney disease, or unspecified chronic kidney disease: Secondary | ICD-10-CM | POA: Insufficient documentation

## 2018-02-25 DIAGNOSIS — Z79899 Other long term (current) drug therapy: Secondary | ICD-10-CM | POA: Diagnosis not present

## 2018-02-25 DIAGNOSIS — Z7982 Long term (current) use of aspirin: Secondary | ICD-10-CM | POA: Diagnosis not present

## 2018-02-25 DIAGNOSIS — W19XXXA Unspecified fall, initial encounter: Secondary | ICD-10-CM

## 2018-02-25 LAB — CBC WITH DIFFERENTIAL/PLATELET
Abs Immature Granulocytes: 0.02 10*3/uL (ref 0.00–0.07)
BASOS PCT: 0 %
Basophils Absolute: 0 10*3/uL (ref 0.0–0.1)
EOS ABS: 0 10*3/uL (ref 0.0–0.5)
EOS PCT: 0 %
HCT: 33.6 % — ABNORMAL LOW (ref 36.0–46.0)
Hemoglobin: 10.6 g/dL — ABNORMAL LOW (ref 12.0–15.0)
IMMATURE GRANULOCYTES: 0 %
Lymphocytes Relative: 21 %
Lymphs Abs: 1.7 10*3/uL (ref 0.7–4.0)
MCH: 30.5 pg (ref 26.0–34.0)
MCHC: 31.5 g/dL (ref 30.0–36.0)
MCV: 96.8 fL (ref 80.0–100.0)
Monocytes Absolute: 0.8 10*3/uL (ref 0.1–1.0)
Monocytes Relative: 9 %
NRBC: 0 % (ref 0.0–0.2)
Neutro Abs: 5.6 10*3/uL (ref 1.7–7.7)
Neutrophils Relative %: 70 %
PLATELETS: 292 10*3/uL (ref 150–400)
RBC: 3.47 MIL/uL — ABNORMAL LOW (ref 3.87–5.11)
RDW: 13.2 % (ref 11.5–15.5)
WBC: 8.1 10*3/uL (ref 4.0–10.5)

## 2018-02-25 LAB — BASIC METABOLIC PANEL
ANION GAP: 11 (ref 5–15)
BUN: 19 mg/dL (ref 8–23)
CALCIUM: 9.2 mg/dL (ref 8.9–10.3)
CO2: 28 mmol/L (ref 22–32)
CREATININE: 0.75 mg/dL (ref 0.44–1.00)
Chloride: 103 mmol/L (ref 98–111)
Glucose, Bld: 72 mg/dL (ref 70–99)
Potassium: 3.7 mmol/L (ref 3.5–5.1)
SODIUM: 142 mmol/L (ref 135–145)

## 2018-02-25 LAB — URINALYSIS, ROUTINE W REFLEX MICROSCOPIC
BACTERIA UA: NONE SEEN
Bilirubin Urine: NEGATIVE
Glucose, UA: NEGATIVE mg/dL
HGB URINE DIPSTICK: NEGATIVE
Ketones, ur: 20 mg/dL — AB
Nitrite: NEGATIVE
PH: 5 (ref 5.0–8.0)
Protein, ur: NEGATIVE mg/dL
Specific Gravity, Urine: 1.028 (ref 1.005–1.030)

## 2018-02-25 MED ORDER — CEPHALEXIN 250 MG PO CAPS
250.0000 mg | ORAL_CAPSULE | Freq: Once | ORAL | Status: AC
  Administered 2018-02-25: 250 mg via ORAL
  Filled 2018-02-25: qty 1

## 2018-02-25 MED ORDER — SODIUM CHLORIDE 0.9 % IV BOLUS
1000.0000 mL | Freq: Once | INTRAVENOUS | Status: AC
  Administered 2018-02-25: 1000 mL via INTRAVENOUS

## 2018-02-25 MED ORDER — CEPHALEXIN 500 MG PO CAPS: 500.0000 mg | ORAL_CAPSULE | Freq: Three times a day (TID) | ORAL | 0 refills | Status: AC

## 2018-02-25 NOTE — Discharge Instructions (Signed)
Take Keflex 3 times daily, urine showed signs of mild UTI, and given fall and weakness today I think it is reasonable to treat this. Symptoms could also be the pseudo-parkinson's we discussed earlier. Labs and work-up otherwise reassuring.

## 2018-02-25 NOTE — ED Notes (Signed)
Bed: WA02 Expected date:  Expected time:  Means of arrival:  Comments: 62 yo UTI? 

## 2018-02-25 NOTE — ED Provider Notes (Signed)
[facility]-EMERGENCY DEPT Provider Note   CSN: [id number] Arrival date & time: 02/25/18  1526     History   Chief Complaint Chief Complaint  Patient presents with  . Fall  . Weakness    HPI SAMELLA Riggs is a 62 y.o. female.  Brenda Riggs is a 62 y.o. Female with a history of hypertension, hyperlipidemia, CKD, diabetes, COPD and stroke, who presents to the emergency department for evaluation after a fall.  [facility], and her caregivers are at bedside and reports that patient was getting ready this morning when she was standing up in front of the toilet and fell backwards she hit her bottom of the low back on the toilet, did not hit her head, the entire event was witnessed by staff that were helping her get ready.  She had no loss of consciousness, no injury to the neck.  She has been ambulatory without difficulty and denies pain anywhere aside from the chronic pain in her right arm.  She is moving all extremities without difficulty.  Her caregivers report that she seems to have some generalized weakness and has had similar falls in the past and she has had a urinary tract infection or been dehydrated they wanted to bring her in for evaluation.  They also report that she has recently been seeing a neurologist with concern for possible Parkinson's but had MRI that looked but and they said that she has all the symptoms of Parkinson's without the focal findings on evaluation and have diagnosed her with "pseudo-Parkinson's" and they think this could also be contributing to her falls.  Patient has not had any fevers or chills, denies any chest pain, shortness of breath, staff reports some occasional cough, she denies any abdominal pain, nausea or vomiting.  Denies any dysuria or urinary frequency.     Past Medical History:  Diagnosis Date  . Allergy   . Asthma   . Blood transfusion without reported diagnosis   . Chronic kidney  disease   . COPD (chronic obstructive pulmonary disease) (HCC)   . Depression   . Diabetes mellitus without complication (HCC)   . Hyperlipidemia   . Hypertension   . Stroke [facility])     Patient Active Problem List   Diagnosis Date Noted  . OSA (obstructive sleep apnea) 07/01/2014  . Cigarette smoker 06/28/2014  . Wheezing 06/27/2014  . MENOPAUSE, SURGICAL 09/09/2009  . INSECT BITE 12/19/2008  . Essential hypertension, benign 09/26/2008  . DIABETES MELLITUS, TYPE II 11/02/2006  . 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 TOBACCO USER 11/02/2006  . MENTAL RETARDATION, MILD 11/02/2006  . ASTHMA 11/02/2006  . HIDRADENITIS SUPPURATIVA 11/02/2006  . HYPERLIPIDEMIA 12/21/2004  . 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 STROKE 10/06/2004  . ATAXIA 08/21/2004  . SYMPTOM, HYPERSOMNIA W/SLEEP APNEA NOS 11/09/1999  . BREAST MASS, LEFT 08/18/1998    Past Surgical History:  Procedure Laterality Date  . ABDOMINAL HYSTERECTOMY    . APPENDECTOMY    . CESAREAN SECTION    . TUBAL LIGATION       OB History   None      Home Medications    Prior to Admission medications   Medication Sig Start Date End Date Taking? Authorizing Provider  acetaminophen (TYLENOL) 325 MG tablet Take 325 mg by mouth every 6 (six) hours as needed (for pain).    Yes [provider]  albuterol (PROVENTIL HFA;VENTOLIN HFA) 108 (90 BASE) MCG/ACT inhaler Inhale  2 puffs into the lungs every 6 (six) hours as needed for wheezing or shortness of breath.   Yes [provider]  aspirin EC 81 MG tablet Take 81 mg by mouth daily.   Yes [provider]  beclomethasone (QVAR) 40 MCG/ACT inhaler Inhale 2 puffs into the lungs 2 (two) times daily.   Yes [provider]  cholecalciferol (VITAMIN D) 1000 UNITS tablet Take 1,000 Units by mouth daily.   Yes [provider]  divalproex (DEPAKOTE ER) 250 MG 24 hr tablet Take 250 mg by mouth at bedtime.   Yes [provider]    fluPHENAZine (PROLIXIN) 5 MG tablet Take 10 mg by mouth at bedtime.   Yes [provider]  FluPHENAZine Decanoate (PROLIXIN DECANOATE IJ) Inject 2 mg into the muscle every 14 (fourteen) days.   Yes [provider]  fluticasone (FLONASE) 50 MCG/ACT nasal spray Place 1 spray into both nostrils daily.   Yes [provider]  gabapentin (NEURONTIN) 300 MG capsule Take 300 mg by mouth daily.    Yes [provider]  LORazepam (ATIVAN) 1 MG tablet Take 0.5 mg by mouth daily as needed for anxiety.    Yes [provider]  losartan (COZAAR) 25 MG tablet Take 25 mg by mouth daily.   Yes [provider]  metFORMIN (GLUCOPHAGE) 500 MG tablet Take 500 mg by mouth daily.    Yes [provider]  metoprolol (LOPRESSOR) 50 MG tablet Take 50 mg by mouth daily.    Yes [provider]  naproxen (NAPROSYN) 500 MG tablet Take 500 mg by mouth every 12 (twelve) hours as needed (for pain).   Yes [provider]  cephALEXin (KEFLEX) 500 MG capsule Take 1 capsule (500 mg total) by mouth 3 (three) times daily for 7 days. 02/25/18 03/04/18  Dartha Lodge, PA-C    Family History Family History  Problem Relation Age of Onset  . Heart disease Mother     Social History Social History   Tobacco Use  . Smoking status: Current Some Day Smoker    Packs/day: 0.10    Years: 41.00    Pack years: 4.10    Types: Cigarettes  . Smokeless tobacco: Never Used  Substance Use Topics  . Alcohol use: No    Alcohol/week: 0.0 standard drinks  . Drug use: No     Allergies   Penicillins and Thioridazine hcl   Review of Systems Review of Systems  Constitutional: Negative for chills and fever.  HENT: Negative.   Eyes: Negative for visual disturbance.  Respiratory: Positive for cough. Negative for chest tightness and shortness of breath.   Cardiovascular: Negative for chest pain and leg swelling.  Gastrointestinal: Negative for abdominal pain,  nausea and vomiting.  Genitourinary: Negative for dysuria and frequency.  Musculoskeletal: Negative for arthralgias and myalgias.  Skin: Negative for color change and rash.  Neurological: Negative for dizziness, syncope, light-headedness and headaches.     Physical Exam Updated Vital Signs BP (!) 139/68 (BP Location: Left Arm)   Pulse 73   Temp 98.4 F (36.9 C) (Oral)   Resp 19   SpO2 100%   Physical Exam  Constitutional: She appears well-developed and well-nourished. No distress.  HENT:  Head: Normocephalic and atraumatic.  Mouth/Throat: Oropharynx is clear and moist.  No evidence of head trauma  Eyes: Pupils are equal, round, and reactive to light. EOM are normal. Right eye exhibits no discharge. Left eye exhibits no discharge.  Neck: Neck supple.  C-spine nontender to palpation and range of motion intact  Cardiovascular: Normal rate, regular rhythm, normal heart sounds and intact distal pulses. Exam reveals no gallop and no friction rub.  No murmur heard. Pulmonary/Chest: Effort normal and breath sounds normal. No respiratory distress. She has no wheezes. She has no rales.  Respirations equal and unlabored, patient able to speak in full sentences, lungs clear to auscultation bilaterally  Abdominal: Soft. Bowel sounds are normal. She exhibits no distension and no mass. There is no tenderness. There is no guarding.  Abdomen soft, nondistended, nontender to palpation in all quadrants without guarding or peritoneal signs  Musculoskeletal: She exhibits no edema or deformity.  Neurological: She is alert. Coordination normal.  Speech is clear, able to follow commands CN III-XII intact Normal strength in upper and lower extremities bilaterally including dorsiflexion and plantar flexion, strong and equal grip strength Sensation normal to light and sharp touch Moves extremities without ataxia, coordination intact  Skin: Skin is warm and dry. Capillary refill takes less than 2 seconds.  She is not diaphoretic.  Psychiatric: She has a normal mood and affect. Her behavior is normal.  Nursing note and vitals reviewed.    ED Treatments / Results  Labs (all labs ordered are listed, but only abnormal results are displayed) Labs Reviewed  CBC WITH DIFFERENTIAL/PLATELET - Abnormal; Notable for the following components:      Result Value   RBC 3.47 (*)    Hemoglobin 10.6 (*)    HCT 33.6 (*)    All other components within normal limits  URINALYSIS, ROUTINE W REFLEX MICROSCOPIC - Abnormal; Notable for the following components:   Ketones, ur 20 (*)    Leukocytes, UA TRACE (*)    All other components within normal limits  URINE CULTURE  BASIC METABOLIC PANEL    EKG EKG Interpretation  Date/Time:  Sunday February 25 2018 16:25:59 EDT Ventricular Rate:  71 PR Interval:    QRS Duration: 89 QT Interval:  408 QTC Calculation: 444 R Axis:   17 Text Interpretation:  Sinus rhythm Abnrm T, consider ischemia, anterolateral lds similar to previous except T wave less deeply inverted. Confirmed by Arby Barrette [phone]) on 02/25/2018 9:54:06 PM   Radiology Dg Chest 2 View  Result Date: 02/25/2018 CLINICAL DATA:  Per EMS, patient from facility, staff reports witnessed fall at approximately 1200 today. C/o left arm pain. Hx chronic arm pain. Staff reports generalized weakness. H/o Asthma, COPD, HTN, Diabetes, Stroke. EXAM: CHEST - 2 VIEW COMPARISON:  06/27/2014 FINDINGS: Heart size is UPPER limits normal and stable in appearance. There are no focal consolidations or pleural effusions. No pulmonary edema. There is no pneumothorax. No acute displaced rib fractures. Chronic changes in both shoulders, LEFT greater than RIGHT. IMPRESSION: No evidence for acute cardiopulmonary abnormality. Electronically Signed   By: Norva Pavlov M.D.   On: 02/25/2018 17:18    Procedures Procedures (including critical care time)  Medications Ordered in ED Medications  sodium chloride 0.9 % bolus  1,000 mL (0 mLs Intravenous Stopped 02/25/18 2023)  cephALEXin (KEFLEX) capsule 250 mg (250 mg Oral Given 02/25/18 2150)     Initial Impression / Assessment and Plan / ED Course  I have reviewed the triage vital signs and the nursing notes.  Pertinent labs & imaging results that were available during my care of the patient were reviewed by me and considered in my medical decision making (see chart for details).  Patient presents from her long-term care facility accompanied by caregivers for  evaluation after fall with concern for generalized weakness, history of the same in the setting of urinary tract infection and dehydration.  On arrival the patient has normal vitals and has no acute distress.  She denies any pain from the fall, no evidence of head trauma, no midline spinal tenderness, no tenderness over the chest or abdomen and moving all extremities without difficulty, no evidence of traumatic injury.  Patient has not had any recent fevers or chills, denies any other focal symptoms.  Will check basic labs, EKG, chest x-ray and urinalysis and give 1 L fluid bolus.  Labs overall reassuring, no leukocytosis, hemoglobin stable when compared to previous, no acute electrolyte derangements and normal renal function, urinalysis with trace leukocytes and some white blood cells suggestive of mild UTI, given falls and more generalized weakness but unusual we will plan to treat with Keflex, chest x-ray shows no evidence of pneumonia or other acute findings and EKG shows no concerning ischemic changes or arrhythmia.  Patient is tolerating p.o.  At this time ago she is stable for discharge back to her facility with prescription for antibiotics for UTI, return precautions been discussed.  Patient expresses understanding and is in agreement with plan. [facility] has been contacted to transport patient back to facility.  Final Clinical Impressions(s) / ED Diagnoses   Final diagnoses:  Fall, initial encounter    Generalized weakness  Acute cystitis without hematuria    ED Discharge Orders         Ordered    cephALEXin (KEFLEX) 500 MG capsule  3 times daily     02/25/18 2151           Dartha Lodge, [place] 02/26/18 7829    Arby Barrette, MD 02/26/18 [phone]

## 2018-02-25 NOTE — ED Notes (Signed)
[facility] called for transport.  

## 2018-02-25 NOTE — ED Triage Notes (Addendum)
Per EMS, patient from facility [facility]), staff reports witnessed fall at approximately 1200 today. C/o left arm pain. Hx chronic arm pain. Staff reports generalized weakness. Oriented to situation, disoriented to time. Hx schizophrenia and stroke with left side deficits. Denies LOC.  Patient denies complaints during time of triage. A&Ox4 during assessment.

## 2018-02-25 NOTE — ED Notes (Signed)
Patient given water

## 2018-02-25 NOTE — ED Notes (Addendum)
Before using purewick pt confused- despite being told that she could void when needed pt thought she was meant to hold her urine. Stated that she was having trouble (did not feel like she could) drink

## 2018-02-25 NOTE — ED Notes (Signed)
Caregivers from facility at bedside.

## 2018-02-25 NOTE — ED Notes (Signed)
Patient transported to X-ray 

## 2018-02-25 NOTE — ED Notes (Signed)
ED Provider at bedside. 

## 2018-02-27 LAB — URINE CULTURE

## 2019-06-11 ENCOUNTER — Other Ambulatory Visit (HOSPITAL_COMMUNITY): Payer: Self-pay | Admitting: *Deleted

## 2019-06-11 DIAGNOSIS — R1310 Dysphagia, unspecified: Secondary | ICD-10-CM

## 2019-06-19 ENCOUNTER — Encounter (HOSPITAL_COMMUNITY): Payer: Self-pay

## 2019-06-19 ENCOUNTER — Encounter (HOSPITAL_COMMUNITY): Payer: Medicaid Other

## 2019-06-19 ENCOUNTER — Ambulatory Visit (HOSPITAL_COMMUNITY): Payer: Medicaid Other

## 2019-06-20 ENCOUNTER — Encounter (HOSPITAL_COMMUNITY): Payer: Self-pay

## 2019-06-20 ENCOUNTER — Ambulatory Visit (HOSPITAL_COMMUNITY): Admission: RE | Admit: 2019-06-20 | Payer: Medicaid Other | Source: Ambulatory Visit

## 2019-06-20 ENCOUNTER — Other Ambulatory Visit (HOSPITAL_COMMUNITY): Payer: Self-pay | Admitting: *Deleted

## 2019-06-20 ENCOUNTER — Other Ambulatory Visit: Payer: Self-pay

## 2019-06-20 DIAGNOSIS — R1310 Dysphagia, unspecified: Secondary | ICD-10-CM

## 2019-06-28 ENCOUNTER — Ambulatory Visit (HOSPITAL_COMMUNITY)
Admission: RE | Admit: 2019-06-28 | Discharge: 2019-06-28 | Disposition: A | Discharge status: Home / Self Care | Payer: Medicaid Other | Source: Ambulatory Visit | Attending: Family Medicine | Admitting: Family Medicine

## 2019-06-28 ENCOUNTER — Other Ambulatory Visit: Payer: Self-pay

## 2019-06-28 DIAGNOSIS — R1310 Dysphagia, unspecified: Secondary | ICD-10-CM | POA: Diagnosis not present

## 2019-07-08 ENCOUNTER — Emergency Department (HOSPITAL_COMMUNITY): Payer: Medicaid Other

## 2019-07-08 ENCOUNTER — Emergency Department (HOSPITAL_COMMUNITY)
Admission: EM | Admit: 2019-07-08 | Discharge: 2019-07-09 | Disposition: A | Discharge status: Home / Self Care | Payer: Medicaid Other | Attending: Emergency Medicine | Admitting: Emergency Medicine

## 2019-07-08 DIAGNOSIS — M79602 Pain in left arm: Secondary | ICD-10-CM | POA: Insufficient documentation

## 2019-07-08 DIAGNOSIS — Y999 Unspecified external cause status: Secondary | ICD-10-CM | POA: Diagnosis not present

## 2019-07-08 DIAGNOSIS — I129 Hypertensive chronic kidney disease with stage 1 through stage 4 chronic kidney disease, or unspecified chronic kidney disease: Secondary | ICD-10-CM | POA: Diagnosis not present

## 2019-07-08 DIAGNOSIS — E1122 Type 2 diabetes mellitus with diabetic chronic kidney disease: Secondary | ICD-10-CM | POA: Insufficient documentation

## 2019-07-08 DIAGNOSIS — F17210 Nicotine dependence, cigarettes, uncomplicated: Secondary | ICD-10-CM | POA: Insufficient documentation

## 2019-07-08 DIAGNOSIS — J449 Chronic obstructive pulmonary disease, unspecified: Secondary | ICD-10-CM | POA: Insufficient documentation

## 2019-07-08 DIAGNOSIS — Z7984 Long term (current) use of oral hypoglycemic drugs: Secondary | ICD-10-CM | POA: Insufficient documentation

## 2019-07-08 DIAGNOSIS — W19XXXA Unspecified fall, initial encounter: Secondary | ICD-10-CM | POA: Insufficient documentation

## 2019-07-08 DIAGNOSIS — Z79899 Other long term (current) drug therapy: Secondary | ICD-10-CM | POA: Diagnosis not present

## 2019-07-08 DIAGNOSIS — Y92122 Bedroom in nursing home as the place of occurrence of the external cause: Secondary | ICD-10-CM | POA: Insufficient documentation

## 2019-07-08 DIAGNOSIS — Y939 Activity, unspecified: Secondary | ICD-10-CM | POA: Diagnosis not present

## 2019-07-08 DIAGNOSIS — R4182 Altered mental status, unspecified: Secondary | ICD-10-CM | POA: Insufficient documentation

## 2019-07-08 DIAGNOSIS — R102 Pelvic and perineal pain: Secondary | ICD-10-CM | POA: Insufficient documentation

## 2019-07-08 DIAGNOSIS — I10 Essential (primary) hypertension: Secondary | ICD-10-CM

## 2019-07-08 DIAGNOSIS — N189 Chronic kidney disease, unspecified: Secondary | ICD-10-CM | POA: Diagnosis not present

## 2019-07-08 LAB — URINALYSIS, ROUTINE W REFLEX MICROSCOPIC
Bilirubin Urine: NEGATIVE
Glucose, UA: NEGATIVE mg/dL
Hgb urine dipstick: NEGATIVE
Ketones, ur: NEGATIVE mg/dL
Nitrite: NEGATIVE
Protein, ur: NEGATIVE mg/dL
Specific Gravity, Urine: 1.012 (ref 1.005–1.030)
pH: 7 (ref 5.0–8.0)

## 2019-07-08 LAB — BASIC METABOLIC PANEL
Anion gap: 9 (ref 5–15)
BUN: 13 mg/dL (ref 8–23)
CO2: 30 mmol/L (ref 22–32)
Calcium: 9.4 mg/dL (ref 8.9–10.3)
Chloride: 102 mmol/L (ref 98–111)
Creatinine, Ser: 0.72 mg/dL (ref 0.44–1.00)
GFR calc Af Amer: >60 mL/min (ref >60)
GFR calc non Af Amer: >60 mL/min (ref >60)
Glucose, Bld: 81 mg/dL (ref 70–99)
Potassium: 4.1 mmol/L (ref 3.5–5.1)
Sodium: 141 mmol/L (ref 135–145)

## 2019-07-08 LAB — CBC WITH DIFFERENTIAL/PLATELET
Abs Immature Granulocytes: 0 10*3/uL (ref 0.00–0.07)
Basophils Absolute: 0 10*3/uL (ref 0.0–0.1)
Basophils Relative: 0 %
Eosinophils Absolute: 0.1 10*3/uL (ref 0.0–0.5)
Eosinophils Relative: 1 %
HCT: 33.7 % — ABNORMAL LOW (ref 36.0–46.0)
Hemoglobin: 11 g/dL — ABNORMAL LOW (ref 12.0–15.0)
Immature Granulocytes: 0 %
Lymphocytes Relative: 41 %
Lymphs Abs: 2.1 10*3/uL (ref 0.7–4.0)
MCH: 31.2 pg (ref 26.0–34.0)
MCHC: 32.6 g/dL (ref 30.0–36.0)
MCV: 95.5 fL (ref 80.0–100.0)
Monocytes Absolute: 0.6 10*3/uL (ref 0.1–1.0)
Monocytes Relative: 11 %
Neutro Abs: 2.4 10*3/uL (ref 1.7–7.7)
Neutrophils Relative %: 47 %
Platelets: 265 10*3/uL (ref 150–400)
RBC: 3.53 MIL/uL — ABNORMAL LOW (ref 3.87–5.11)
RDW: 14 % (ref 11.5–15.5)
WBC: 5.1 10*3/uL (ref 4.0–10.5)
nRBC: 0 % (ref 0.0–0.2)

## 2019-07-08 LAB — CK: Total CK: 42 U/L (ref 38–234)

## 2019-07-08 MED ORDER — LABETALOL HCL 5 MG/ML IV SOLN
5.0000 mg | Freq: Once | INTRAVENOUS | Status: AC
  Administered 2019-07-08: 5 mg via INTRAVENOUS
  Filled 2019-07-08: qty 4

## 2019-07-08 MED ORDER — LABETALOL HCL 5 MG/ML IV SOLN
5.0000 mg | Freq: Once | INTRAVENOUS | Status: AC
  Administered 2019-07-09: 5 mg via INTRAVENOUS
  Filled 2019-07-08: qty 4

## 2019-07-08 MED ORDER — LABETALOL HCL 5 MG/ML IV SOLN: 5.0000 mg | Freq: Once | INTRAVENOUS | Status: DC

## 2019-07-08 NOTE — ED Triage Notes (Signed)
PT was found on floor with a chair ontop of her. The staff at [facility] report  Pt had an unwitnessed fall. Time unknown.  Pt alter on EMS arrival to SNF. Pt has c-collar on . Pt A/O to name and DOB

## 2019-07-08 NOTE — ED Provider Notes (Signed)
[facility] EMERGENCY DEPARTMENT Provider Note   CSN: [id number] Arrival date & time: 07/08/19  1818     History Chief Complaint  Patient presents with  . Fall    B[facility]da [facility] is a 64 y.o. female.  [facility] is a 64 y.o. female with a history of hypertension, hyperlipidemia, diabetes, stroke, COPD, CKD, and schizophrenia, who presents from [facility] skilled nursing facility via EMS after an unwitnessed fall.  Facility staff states that they came into check on the patient and bring her to the dining room for dinner and she was found on the floor with a chair pulled down next to her.  Patient states that she was trying to put on her shoes slipped and fell, she grabbed a chair to try and stop herself and pulled it down with her.  Facility staff was concerned that she was a little less responsive than usual.  They state at baseline she is able to answer questions and have a conversation, responses are sometimes somewhat delayed.  They also noted that her blood pressure was more elevated than usual when EMS arrived on the scene.  She does complain of some left arm pain, and EMS stated that she winced a little with palpation over the left pelvis but she is unable to localize any other pain.  EMS applied c-collar.  She denies neck or back pain, denies numbness tingling or weakness in her extremities.  Denies chest pain, shortness of breath or abdominal pain.  No pain to her lower extremities.  No other aggravating or alleviating factors.  Patient is able to answer some questions with delayed responses, and history is limited.  Patient with some schizophrenia and dementia.  Level 5 caveat        Past Medical History:  Diagnosis Date  . Allergy   . Asthma   . Blood transfusion without reported diagnosis   . Chronic kidney disease   . COPD (chronic obstructive pulmonary disease) (HCC)   . Depression   . Diabetes mellitus without complication (HCC)   .  Hyperlipidemia   . Hypertension   . Stroke [facility])     Patient Active Problem List   Diagnosis Date Noted  . OSA (obstructive sleep apnea) 07/01/2014  . Cigarette smoker 06/28/2014  . Wheezing 06/27/2014  . MENOPAUSE, SURGICAL 09/09/2009  . INSECT BITE 12/19/2008  . Essential hypertension, benign 09/26/2008  . DIABETES MELLITUS, TYPE II 11/02/2006  . 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 TOBACCO USER 11/02/2006  . MENTAL RETARDATION, MILD 11/02/2006  . ASTHMA 11/02/2006  . HIDRADENITIS SUPPURATIVA 11/02/2006  . HYPERLIPIDEMIA 12/21/2004  . 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 STROKE 10/06/2004  . ATAXIA 08/21/2004  . SYMPTOM, HYPERSOMNIA W/SLEEP APNEA NOS 11/09/1999  . BREAST MASS, LEFT 08/18/1998    Past Surgical History:  Procedure Laterality Date  . ABDOMINAL HYSTERECTOMY    . APPENDECTOMY    . CESAREAN SECTION    . TUBAL LIGATION       OB History   No obstetric history on file.     Family History  Problem Relation Age of Onset  . Heart disease Mother     Social History   Tobacco Use  . Smoking status: Current Some Day Smoker    Packs/day: 0.10    Years: 41.00    Pack years: 4.10    Types: Cigarettes  . Smokeless tobacco: Never Used  Substance Use Topics  . Alcohol use: No    Alcohol/week: 0.0  standard drinks  . Drug use: No    Home Medications Prior to Admission medications   Medication Sig Start Date End Date Taking? Authorizing Provider  acetaminophen (TYLENOL) 325 MG tablet Take 650 mg by mouth every 6 (six) hours as needed for headache (minor discomfort/fever up to 100 degrees orally).    Yes [provider]  albuterol (PROVENTIL HFA;VENTOLIN HFA) 108 (90 BASE) MCG/ACT inhaler Inhale 2 puffs into the lungs 4 (four) times daily as needed for wheezing or shortness of breath.    Yes [provider]  Alum & Mag Hydroxide-Simeth (GNP ANTACID REGULAR STRENGTH PO) Take 30 mLs by mouth daily as needed (heartburn/indigestion).   Yes  [provider]  beclomethasone (QVAR REDIHALER) 40 MCG/ACT inhaler Inhale 2 puffs into the lungs 2 (two) times daily.   Yes [provider]  cholecalciferol (VITAMIN D3) 25 MCG (1000 UNIT) tablet Take 1,000 Units by mouth daily.   Yes [provider]  dimenhyDRINATE (DRAMAMINE) 50 MG tablet Take 50 mg by mouth every 4 (four) hours as needed (nausea/vomiting).   Yes [provider]  divalproex (DEPAKOTE ER) 250 MG 24 hr tablet Take 250 mg by mouth at bedtime.   Yes [provider]  fluPHENAZine (PROLIXIN) 5 MG tablet Take 5 mg by mouth at bedtime.    Yes [provider]  fluticasone (FLONASE) 50 MCG/ACT nasal spray Place 1 spray into both nostrils daily.   Yes [provider]  gabapentin (NEURONTIN) 100 MG capsule Take 200 mg by mouth at bedtime.   Yes [provider]  guaifenesin (SILTUSSIN SA) 100 MG/5ML syrup Take 200 mg by mouth every 6 (six) hours as needed for cough.   Yes [provider]  loperamide (IMODIUM A-D) 2 MG tablet Take 2 mg by mouth See admin instructions. Take one tablet (2 mg) by mouth after each loose stool, max 3 tablets (6 mg) in 12 hours, notify MD if diarrhea persists after 12 hours   Yes [provider]  magnesium hydroxide (MILK OF MAGNESIA) 400 MG/5ML suspension Take 7.5 mLs by mouth every 8 (eight) hours as needed (constipation (if no relief in 8 hours give with 6 oz prune juice).   Yes [provider]  metFORMIN (GLUCOPHAGE) 500 MG tablet Take 500 mg by mouth every 12 (twelve) hours.    Yes [provider]  metoprolol (LOPRESSOR) 50 MG tablet Take 50 mg by mouth daily.    Yes [provider]  naproxen (NAPROSYN) 500 MG tablet Take 500 mg by mouth every 12 (twelve) hours as needed (for pain).   Yes [provider]  neomycin-bacitracin-polymyxin (NEOSPORIN) [phone] ointment Apply 1 application topically daily as needed (wound care).   Yes [provider]  Sodium Phosphates (FLEET ENEMA RE) Place 1 enema rectally daily as needed (constipation not relieved by MOM and prune juice).   Yes [provider]    Allergies    Penicillins and Thioridazine hcl  Review of Systems   Review of Systems  Unable to perform ROS: Dementia    Physical Exam Updated Vital Signs BP (!) 161/91 (BP Location: Right Arm)   Pulse 83   Temp 98.5 F (36.9 C) (Oral)   Resp 19   SpO2 100%   Physical Exam Vitals and nursing note reviewed.  Constitutional:      General: She is not in acute distress.    Appearance: Normal appearance. She is well-developed. She is not ill-appearing or diaphoretic.  HENT:  Head: Normocephalic and atraumatic.     Comments: No palpable step off, deformity or hematoma, neg battle sign    Mouth/Throat:     Mouth: Mucous membranes are moist.     Pharynx: Oropharynx is clear.  Eyes:     General:        Right eye: No discharge.        Left eye: No discharge.     Extraocular Movements: Extraocular movements intact.     Pupils: Pupils are equal, round, and reactive to light.  Cardiovascular:     Rate and Rhythm: Normal rate and regular rhythm.     Heart sounds: Normal heart sounds. No murmur. No friction rub. No gallop.   Pulmonary:     Effort: Pulmonary effort is normal. No respiratory distress.     Breath sounds: Normal breath sounds. No wheezing or rales.     Comments: Respirations equal and unlabored, patient able to speak in full sentences, lungs clear to auscultation bilaterally, chest wall non-tender Abdominal:     General: Bowel sounds are normal. There is no distension.     Palpations: Abdomen is soft. There is no mass.     Tenderness: There is no abdominal tenderness. There is no guarding.     Comments: Abdomen soft, nondistended, nontender to palpation in all quadrants without guarding or peritoneal signs  Musculoskeletal:        General: No deformity.     Cervical back: Neck supple.      Comments: No focal midline thoracic or lumbar spine tenderness, no palpable step-off or deformity. Patient does not have tenderness to palpation over the pelvis currently but EMS did report it, there is some tenderness over the left humerus, no focal tenderness over the shoulder, elbow or wrist with normal range of motion.  All of the joints supple and easily movable.  All compartments soft.  Skin:    General: Skin is warm and dry.     Capillary Refill: Capillary refill takes less than 2 seconds.  Neurological:     Mental Status: She is alert.     Coordination: Coordination normal.     Comments: Speech is clear but somewhat delayed, able to follow commands CN III-XII intact Normal strength in upper and lower extremities bilaterally including dorsiflexion and plantar flexion, strong and equal grip strength Sensation normal to light and sharp touch Moves extremities without ataxia, coordination intact  Psychiatric:        Mood and Affect: Mood normal.        Behavior: Behavior normal.     ED Results / Procedures / Treatments   Labs (all labs ordered are listed, but only abnormal results are displayed) Labs Reviewed  CBC WITH DIFFERENTIAL/PLATELET - Abnormal; Notable for the following components:      Result Value   RBC 3.53 (*)    Hemoglobin 11.0 (*)    HCT 33.7 (*)    All other components within normal limits  URINALYSIS, ROUTINE W REFLEX MICROSCOPIC - Abnormal; Notable for the following components:   Color, Urine STRAW (*)    Leukocytes,Ua TRACE (*)    Bacteria, UA RARE (*)    All other components within normal limits  BASIC METABOLIC PANEL  CK    EKG EKG Interpretation  Date/Time:  Monday July 08 2019 18:27:57 EST Ventricular Rate:  84 PR Interval:    QRS Duration: 90 QT Interval:  363 QTC Calculation: 430 R Axis:   24 Text Interpretation: Sinus rhythm Probable left atrial  enlargement No STEMI Confirmed by Alona BeneLong, Joshua [phone]([id number]) on 07/08/2019 6:33:10  PM   Radiology DG Chest 2 View  Result Date: 07/08/2019 CLINICAL DATA:  Found on floor. Unwitnessed fall. EXAM: CHEST - 2 VIEW COMPARISON:  02/25/2018 FINDINGS: Lateral view degraded by patient arm position. Apical lordotic frontal radiograph. Numerous leads and wires project over the chest. Midline trachea. Normal heart size. Atherosclerosis in the transverse aorta. No pleural effusion or pneumothorax. Clear lungs. No congestive failure. IMPRESSION: No acute cardiopulmonary disease. Aortic Atherosclerosis (ICD10-I70.0). Electronically Signed   By: Jeronimo GreavesKyle  Talbot M.D.   On: 07/08/2019 19:32   DG Pelvis 1-2 Views  Result Date: 07/08/2019 CLINICAL DATA:  64 year old female found down, unwitnessed fall. EXAM: PELVIS - 1-2 VIEW COMPARISON:  Left hip series 03/31/2017. FINDINGS: Over penetration of the image. Femoral heads remain normally located. Grossly intact proximal femurs. No acute pelvis fracture is evident. Femoral artery calcified atherosclerosis. There is retained barium type contrast mixed with stool in the distal colon. Negative visible bowel gas pattern. IMPRESSION: 1. Suboptimal radiographic technique. No acute fracture or dislocation identified about the pelvis. 2. Incidental retained barium type contrast in the distal colon. Electronically Signed   By: Odessa FlemingH  Hall M.D.   On: 07/08/2019 19:25   CT Head Wo Contrast  Result Date: 07/08/2019 CLINICAL DATA:  Un witnessed fall EXAM: CT HEAD WITHOUT CONTRAST CT CERVICAL SPINE WITHOUT CONTRAST TECHNIQUE: Multidetector CT imaging of the head and cervical spine was performed following the standard protocol without intravenous contrast. Multiplanar CT image reconstructions of the cervical spine were also generated. COMPARISON:  03/28/2017 FINDINGS: CT HEAD FINDINGS Brain: Scattered chronic white matter ischemic change is noted. No findings to suggest acute hemorrhage, acute infarction or space-occupying mass lesion are noted. Vascular: No hyperdense vessel or  unexpected calcification. Skull: Normal. Negative for fracture or focal lesion. Sinuses/Orbits: No acute finding. Other: None. CT CERVICAL SPINE FINDINGS Alignment: Within normal limits. Skull base and vertebrae: 7 cervical segments are well visualized. Vertebral body height is well maintained. Multilevel osteophytic changes and facet hypertrophic changes are seen. No acute fracture or acute facet abnormality is noted. Soft tissues and spinal canal: Surrounding soft tissue structures show vascular calcifications. No other focal abnormality is seen. Upper chest: Visualized lung apices are within normal limits. Other: None IMPRESSION: CT of the head: Chronic white matter ischemic change without acute abnormality. CT of the cervical spine: Degenerative change without acute abnormality. Electronically Signed   By: Alcide CleverMark  Lukens M.D.   On: 07/08/2019 19:38   CT Cervical Spine Wo Contrast  Result Date: 07/08/2019 CLINICAL DATA:  Un witnessed fall EXAM: CT HEAD WITHOUT CONTRAST CT CERVICAL SPINE WITHOUT CONTRAST TECHNIQUE: Multidetector CT imaging of the head and cervical spine was performed following the standard protocol without intravenous contrast. Multiplanar CT image reconstructions of the cervical spine were also generated. COMPARISON:  03/28/2017 FINDINGS: CT HEAD FINDINGS Brain: Scattered chronic white matter ischemic change is noted. No findings to suggest acute hemorrhage, acute infarction or space-occupying mass lesion are noted. Vascular: No hyperdense vessel or unexpected calcification. Skull: Normal. Negative for fracture or focal lesion. Sinuses/Orbits: No acute finding. Other: None. CT CERVICAL SPINE FINDINGS Alignment: Within normal limits. Skull base and vertebrae: 7 cervical segments are well visualized. Vertebral body height is well maintained. Multilevel osteophytic changes and facet hypertrophic changes are seen. No acute fracture or acute facet abnormality is noted. Soft tissues and spinal canal:  Surrounding soft tissue structures show vascular calcifications. No other focal abnormality is seen. Upper chest:  Visualized lung apices are within normal limits. Other: None IMPRESSION: CT of the head: Chronic white matter ischemic change without acute abnormality. CT of the cervical spine: Degenerative change without acute abnormality. Electronically Signed   By: Alcide Clever M.D.   On: 07/08/2019 19:38   DG Humerus Left  Result Date: 07/08/2019 CLINICAL DATA:  Fall. EXAM: LEFT HUMERUS - 2+ VIEW COMPARISON:  No prior. FINDINGS: There is no acute fracture lucency or dislocation of the left humerus. Diffuse bone demineralization is present. There are moderate degenerative changes of the left shoulder and mild degenerative changes of the left elbow. There is mild subcutaneous soft tissue of the left shoulder without a radiopaque foreign body. 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 IMPRESSION: Mild nonspecific soft tissue edema of the left shoulder without acute bony injury of the left humerus. Diffuse bone demineralization. 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 Electronically Signed   By: Laurence Ferrari   On: 07/08/2019 19:27    Procedures Procedures (including critical care time)  Medications Ordered in ED Medications - No data to display  ED Course  I have reviewed the triage vital signs and the nursing notes.  Pertinent labs & imaging results that were available during my care of the patient were reviewed by me and considered in my medical decision making (see chart for details).    MDM Rules/Calculators/A&P                      64 year old female presents from SNF via EMS after unwitnessed fall.  Per facility staff patient seems a little less responsive than  usual and her blood pressure is much higher than usual, they state over the past 2 weeks her blood pressure has usually ranged in the 130-140s, and has been in the 160s-190 since arriving here in the department.  She is only able to localize pain to her left upper arm with no obvious deformity.  Patient did have some pain on palpation over the left pelvis with EMS.  Will check lab work and get CTs of the head and cervical spine as well as x-rays of the chest, pelvis and left humerus.  Patient's lab work is largely reassuring there is no leukocytosis, stable hemoglobin, no acute electrolyte derangements, normal renal function.  CK is not elevated, so doubt patient was on the ground for long, urinalysis without any signs of infection.  CTs of the head and cervical spine show no acute injury, left humerus with some soft tissue edema of the shoulder, no acute bony injury, diffuse bone demineralization noted.  Chest and pelvis films unremarkable.  Patient remains hypertensive which is very typical for the patient with that and her decreased responsiveness and unclear etiology for fall, question whether patient could have had stroke, will get MRI to rule out.  MRI without evidence of stroke, does show signs of chronic uncontrolled hypertension.  Will treat patient's blood pressure here in the ED with labetalol, and I have added a note to patient's discharge paperwork asking them to discuss with her regular doctor her blood pressure for further management, at this time she is only on metoprolol once daily.  Patient's blood pressure improved significantly to the 150 systolic with labetalol.  She seems to be back to her baseline mental status will discharge  back to facility, follow-up in return precautions provided on paper.  Discharge back to SNF in good condition.  BP (!) 154/74   Pulse 81   Temp 98.5 F (36.9 C) (Oral)   Resp 11   SpO2 97%   Final Clinical Impression(s) / ED Diagnoses Final diagnoses:   Fall, initial encounter  Hypertension, unspecified type    Rx / DC Orders ED Discharge Orders    None       Legrand Rams 07/09/19 1456    Maia Plan, MD 07/11/19 Ernestina [place]

## 2019-07-08 NOTE — ED Notes (Signed)
[phone] pts daughter Edison Pace wants an update

## 2019-07-09 NOTE — Discharge Instructions (Addendum)
Patient's work-up today is reassuring, her lab work overall looks good and imaging does not show any injuries from the fall.  Urinalysis showed no evidence of urinary tract infection today.  Patient's blood pressure was significantly elevated from her norm today, MRI completed to rule out stroke.  We did not see any evidence of a stroke today but did see signs of uncontrolled high blood pressure on her MRI today.  Her MRI was treated in the emergency department with improvement but please discuss with her regular doctor continued blood pressure treatment.  Return to the ED for any other new or concerning symptoms.

## 2019-07-09 NOTE — ED Notes (Signed)
Pt unable to sign discharge paperwork, provided with discharge information. Arm band removed

## 2019-07-09 NOTE — ED Notes (Signed)
Called ptar for pt, [employer]

## 2020-03-21 ENCOUNTER — Inpatient Hospital Stay (HOSPITAL_COMMUNITY): Payer: Medicaid Other

## 2020-03-21 ENCOUNTER — Other Ambulatory Visit: Payer: Self-pay

## 2020-03-21 ENCOUNTER — Emergency Department (HOSPITAL_COMMUNITY): Payer: Medicaid Other

## 2020-03-21 ENCOUNTER — Inpatient Hospital Stay (HOSPITAL_COMMUNITY)
Admission: EM | Admit: 2020-03-21 | Discharge: 2020-03-25 | DRG: 085 | Disposition: A | Discharge status: Skilled Nursing Facility | Payer: Medicaid Other | Attending: Neurology | Admitting: Neurology

## 2020-03-21 ENCOUNTER — Encounter (HOSPITAL_COMMUNITY): Payer: Self-pay

## 2020-03-21 DIAGNOSIS — Z7984 Long term (current) use of oral hypoglycemic drugs: Secondary | ICD-10-CM

## 2020-03-21 DIAGNOSIS — I613 Nontraumatic intracerebral hemorrhage in brain stem: Secondary | ICD-10-CM | POA: Diagnosis not present

## 2020-03-21 DIAGNOSIS — R402242 Coma scale, best verbal response, confused conversation, at arrival to emergency department: Secondary | ICD-10-CM | POA: Diagnosis present

## 2020-03-21 DIAGNOSIS — I6389 Other cerebral infarction: Secondary | ICD-10-CM | POA: Diagnosis not present

## 2020-03-21 DIAGNOSIS — I161 Hypertensive emergency: Secondary | ICD-10-CM | POA: Diagnosis present

## 2020-03-21 DIAGNOSIS — S0181XA Laceration without foreign body of other part of head, initial encounter: Secondary | ICD-10-CM | POA: Diagnosis not present

## 2020-03-21 DIAGNOSIS — L89312 Pressure ulcer of right buttock, stage 2: Secondary | ICD-10-CM | POA: Diagnosis present

## 2020-03-21 DIAGNOSIS — G8194 Hemiplegia, unspecified affecting left nondominant side: Secondary | ICD-10-CM | POA: Diagnosis present

## 2020-03-21 DIAGNOSIS — Z23 Encounter for immunization: Secondary | ICD-10-CM

## 2020-03-21 DIAGNOSIS — L8990 Pressure ulcer of unspecified site, unspecified stage: Secondary | ICD-10-CM | POA: Insufficient documentation

## 2020-03-21 DIAGNOSIS — I10 Essential (primary) hypertension: Secondary | ICD-10-CM | POA: Diagnosis not present

## 2020-03-21 DIAGNOSIS — E119 Type 2 diabetes mellitus without complications: Secondary | ICD-10-CM | POA: Diagnosis present

## 2020-03-21 DIAGNOSIS — L89323 Pressure ulcer of left buttock, stage 3: Secondary | ICD-10-CM | POA: Diagnosis present

## 2020-03-21 DIAGNOSIS — S06350A Traumatic hemorrhage of left cerebrum without loss of consciousness, initial encounter: Principal | ICD-10-CM | POA: Diagnosis present

## 2020-03-21 DIAGNOSIS — L89152 Pressure ulcer of sacral region, stage 2: Secondary | ICD-10-CM | POA: Diagnosis present

## 2020-03-21 DIAGNOSIS — G4733 Obstructive sleep apnea (adult) (pediatric): Secondary | ICD-10-CM | POA: Diagnosis present

## 2020-03-21 DIAGNOSIS — E785 Hyperlipidemia, unspecified: Secondary | ICD-10-CM | POA: Diagnosis present

## 2020-03-21 DIAGNOSIS — Z20822 Contact with and (suspected) exposure to covid-19: Secondary | ICD-10-CM | POA: Diagnosis present

## 2020-03-21 DIAGNOSIS — Z90710 Acquired absence of both cervix and uterus: Secondary | ICD-10-CM | POA: Diagnosis not present

## 2020-03-21 DIAGNOSIS — E049 Nontoxic goiter, unspecified: Secondary | ICD-10-CM

## 2020-03-21 DIAGNOSIS — I69322 Dysarthria following cerebral infarction: Secondary | ICD-10-CM | POA: Diagnosis not present

## 2020-03-21 DIAGNOSIS — J449 Chronic obstructive pulmonary disease, unspecified: Secondary | ICD-10-CM | POA: Diagnosis present

## 2020-03-21 DIAGNOSIS — R402362 Coma scale, best motor response, obeys commands, at arrival to emergency department: Secondary | ICD-10-CM | POA: Diagnosis present

## 2020-03-21 DIAGNOSIS — R1310 Dysphagia, unspecified: Secondary | ICD-10-CM

## 2020-03-21 DIAGNOSIS — R402142 Coma scale, eyes open, spontaneous, at arrival to emergency department: Secondary | ICD-10-CM | POA: Diagnosis present

## 2020-03-21 DIAGNOSIS — I619 Nontraumatic intracerebral hemorrhage, unspecified: Secondary | ICD-10-CM | POA: Diagnosis present

## 2020-03-21 DIAGNOSIS — Z79899 Other long term (current) drug therapy: Secondary | ICD-10-CM

## 2020-03-21 DIAGNOSIS — S06360A Traumatic hemorrhage of cerebrum, unspecified, without loss of consciousness, initial encounter: Secondary | ICD-10-CM

## 2020-03-21 DIAGNOSIS — Z8249 Family history of ischemic heart disease and other diseases of the circulatory system: Secondary | ICD-10-CM

## 2020-03-21 DIAGNOSIS — Z7951 Long term (current) use of inhaled steroids: Secondary | ICD-10-CM | POA: Diagnosis not present

## 2020-03-21 DIAGNOSIS — F17210 Nicotine dependence, cigarettes, uncomplicated: Secondary | ICD-10-CM | POA: Diagnosis present

## 2020-03-21 DIAGNOSIS — F0280 Dementia in other diseases classified elsewhere without behavioral disturbance: Secondary | ICD-10-CM | POA: Diagnosis present

## 2020-03-21 DIAGNOSIS — W19XXXA Unspecified fall, initial encounter: Secondary | ICD-10-CM | POA: Diagnosis not present

## 2020-03-21 LAB — COMPREHENSIVE METABOLIC PANEL
ALT: 10 U/L (ref 0–44)
AST: 21 U/L (ref 15–41)
Albumin: 4.1 g/dL (ref 3.5–5.0)
Alkaline Phosphatase: 57 U/L (ref 38–126)
Anion gap: 12 (ref 5–15)
BUN: 18 mg/dL (ref 8–23)
CO2: 31 mmol/L (ref 22–32)
Calcium: 9.4 mg/dL (ref 8.9–10.3)
Chloride: 96 mmol/L — ABNORMAL LOW (ref 98–111)
Creatinine, Ser: 0.53 mg/dL (ref 0.44–1.00)
GFR, Estimated: >60 mL/min (ref >60)
Glucose, Bld: 91 mg/dL (ref 70–99)
Potassium: 3.6 mmol/L (ref 3.5–5.1)
Sodium: 139 mmol/L (ref 135–145)
Total Bilirubin: 0.5 mg/dL (ref 0.3–1.2)
Total Protein: 7.6 g/dL (ref 6.5–8.1)

## 2020-03-21 LAB — HEMOGLOBIN A1C
Hgb A1c MFr Bld: 5.3 % (ref 4.8–5.6)
Mean Plasma Glucose: 105.41 mg/dL

## 2020-03-21 LAB — RESP PANEL BY RT-PCR (FLU A&B, COVID) ARPGX2
Influenza A by PCR: NEGATIVE
Influenza B by PCR: NEGATIVE
SARS Coronavirus 2 by RT PCR: NEGATIVE

## 2020-03-21 LAB — CBC
HCT: 35.1 % — ABNORMAL LOW (ref 36.0–46.0)
Hemoglobin: 11.4 g/dL — ABNORMAL LOW (ref 12.0–15.0)
MCH: 31.1 pg (ref 26.0–34.0)
MCHC: 32.5 g/dL (ref 30.0–36.0)
MCV: 95.6 fL (ref 80.0–100.0)
Platelets: 337 10*3/uL (ref 150–400)
RBC: 3.67 MIL/uL — ABNORMAL LOW (ref 3.87–5.11)
RDW: 14 % (ref 11.5–15.5)
WBC: 6.8 10*3/uL (ref 4.0–10.5)
nRBC: 0 % (ref 0.0–0.2)

## 2020-03-21 LAB — MRSA PCR SCREENING: MRSA by PCR: NEGATIVE

## 2020-03-21 LAB — GLUCOSE, CAPILLARY: Glucose-Capillary: 74 mg/dL (ref 70–99)

## 2020-03-21 MED ORDER — ACETAMINOPHEN 650 MG RE SUPP: 650.0000 mg | RECTAL | Status: DC | PRN

## 2020-03-21 MED ORDER — PANTOPRAZOLE SODIUM 40 MG IV SOLR: 40.0000 mg | Freq: Every day | INTRAVENOUS | Status: DC

## 2020-03-21 MED ORDER — INSULIN ASPART 100 UNIT/ML ~~LOC~~ SOLN
0.0000 [IU] | Freq: Three times a day (TID) | SUBCUTANEOUS | Status: DC
  Administered 2020-03-23 – 2020-03-24 (×3): 1 [IU] via SUBCUTANEOUS
  Administered 2020-03-25: 2 [IU] via SUBCUTANEOUS

## 2020-03-21 MED ORDER — SENNOSIDES-DOCUSATE SODIUM 8.6-50 MG PO TABS: 1.0000 | ORAL_TABLET | Freq: Two times a day (BID) | ORAL | Status: DC

## 2020-03-21 MED ORDER — LIDOCAINE-EPINEPHRINE 2 %-1:100000 IJ SOLN
20.0000 mL | Freq: Once | INTRAMUSCULAR | Status: DC
  Filled 2020-03-21: qty 1

## 2020-03-21 MED ORDER — PANTOPRAZOLE SODIUM 40 MG IV SOLR
40.0000 mg | Freq: Every day | INTRAVENOUS | Status: DC
  Administered 2020-03-21: 40 mg via INTRAVENOUS
  Filled 2020-03-21: qty 40

## 2020-03-21 MED ORDER — LABETALOL HCL 5 MG/ML IV SOLN
20.0000 mg | Freq: Once | INTRAVENOUS | Status: DC
  Filled 2020-03-21: qty 4

## 2020-03-21 MED ORDER — SENNOSIDES-DOCUSATE SODIUM 8.6-50 MG PO TABS
1.0000 | ORAL_TABLET | Freq: Two times a day (BID) | ORAL | Status: DC
  Administered 2020-03-22 – 2020-03-25 (×7): 1 via ORAL
  Filled 2020-03-21 (×7): qty 1

## 2020-03-21 MED ORDER — CHLORHEXIDINE GLUCONATE 0.12 % MT SOLN
15.0000 mL | Freq: Two times a day (BID) | OROMUCOSAL | Status: DC
  Administered 2020-03-21 – 2020-03-25 (×8): 15 mL via OROMUCOSAL
  Filled 2020-03-21 (×5): qty 15

## 2020-03-21 MED ORDER — GADOBUTROL 1 MMOL/ML IV SOLN
9.0000 mL | Freq: Once | INTRAVENOUS | Status: AC | PRN
  Administered 2020-03-21: 9 mL via INTRAVENOUS

## 2020-03-21 MED ORDER — CLEVIDIPINE BUTYRATE 0.5 MG/ML IV EMUL
0.0000 mg/h | INTRAVENOUS | Status: DC
  Administered 2020-03-21: 16 mg/h via INTRAVENOUS
  Administered 2020-03-21: 12 mg/h via INTRAVENOUS
  Administered 2020-03-21: 1 mg/h via INTRAVENOUS
  Filled 2020-03-21 (×4): qty 50

## 2020-03-21 MED ORDER — CHLORHEXIDINE GLUCONATE CLOTH 2 % EX PADS
6.0000 | MEDICATED_PAD | Freq: Every day | CUTANEOUS | Status: DC
  Administered 2020-03-21 – 2020-03-25 (×5): 6 via TOPICAL

## 2020-03-21 MED ORDER — ACETAMINOPHEN 325 MG PO TABS
650.0000 mg | ORAL_TABLET | ORAL | Status: DC | PRN
  Administered 2020-03-23: 650 mg via ORAL
  Filled 2020-03-21: qty 2

## 2020-03-21 MED ORDER — STROKE: EARLY STAGES OF RECOVERY BOOK
Freq: Once | Status: AC
  Filled 2020-03-21: qty 1

## 2020-03-21 MED ORDER — CLEVIDIPINE BUTYRATE 0.5 MG/ML IV EMUL
0.0000 mg/h | INTRAVENOUS | Status: DC
  Administered 2020-03-21 – 2020-03-23 (×4): 2 mg/h via INTRAVENOUS
  Filled 2020-03-21 (×4): qty 50

## 2020-03-21 MED ORDER — ACETAMINOPHEN 160 MG/5ML PO SOLN: 650.0000 mg | ORAL | Status: DC | PRN

## 2020-03-21 NOTE — H&P (Addendum)
Neurology ICH admission   CC: Fall  History is obtained from: Chart, patient  HPI: Brenda Riggs is a 64 y.o. female past medical history of prior strokes with residual dysarthria, diabetes, hypertension, hyperlipidemia, COPD, presented to the emergency room at [facility] for evaluation of a witnessed fall.  She also has a history of dementia from her prior stroke.  She is not able to provide any reliable history. She was evaluated in the emergency room, had a left forehead laceration that was repaired.  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  No C-spine injury on C-spine CT scan. She was transferred over to [facility] for neurological evaluation and admission. Patient has significant dysarthria on examination.  No family at bedside No neurological notes in the past in our electronic medical record or Care Everywhere. Denies headaches.  Denies chest pain.  Denies shortness of breath.  Denies fevers or chills. Denies having had loss consciousness but I think she is an unreliable historian  LKW: Unclear tpa given?: no, ICH Premorbid modified Rankin scale (mRS): Unable to ascertain ICH score-1 for infratentorial origin  ROS: Performed and negative except as noted in HPI.  Past Medical History:  Diagnosis Date  . Allergy   . Asthma   . Blood transfusion without reported diagnosis   . Chronic kidney disease   . COPD (chronic obstructive pulmonary disease) (HCC)   . Depression   . Diabetes mellitus without complication (HCC)   . Hyperlipidemia   . Hypertension   . Stroke [facility])     Family History  Problem Relation Age of Onset  . Heart disease Mother      Social History:   reports that she has been smoking cigarettes. She has a 4.10 pack-year smoking history. She has never used smokeless tobacco. She reports that she does not drink alcohol and does not use  drugs.  Medications  Current Facility-Administered Medications:  .   stroke: mapping our early stages of recovery book, , Does not apply, Once, Milon Dikes, MD .  acetaminophen (TYLENOL) tablet 650 mg, 650 mg, Oral, Q4H PRN **OR** acetaminophen (TYLENOL) 160 MG/5ML solution 650 mg, 650 mg, Per Tube, Q4H PRN **OR** acetaminophen (TYLENOL) suppository 650 mg, 650 mg, Rectal, Q4H PRN, Fayrene Helper, PA-C .  labetalol (NORMODYNE) injection 20 mg, 20 mg, Intravenous, Once **AND** clevidipine (CLEVIPREX) infusion 0.5 mg/mL, 0-21 mg/hr, Intravenous, Continuous, Milon Dikes, MD .  Melene Muller ON 03/22/2020] insulin aspart (novoLOG) injection 0-6 Units, 0-6 Units, Subcutaneous, TID WC, Milon Dikes, MD .  lidocaine-EPINEPHrine (XYLOCAINE W/EPI) 2 %-1:100000 (with pres) injection 20 mL, 20 mL, Infiltration, Once, Fayrene Helper, PA-C .  pantoprazole (PROTONIX) injection 40 mg, 40 mg, Intravenous, QHS, Milon Dikes, MD .  senna-docusate (Senokot-S) tablet 1 tablet, 1 tablet, Oral, BID, Milon Dikes, MD   Exam: Current vital signs: BP 123/62   Pulse 96   Temp 98.2 F (36.8 C) (Oral)   Resp 13   SpO2 100%  Vital signs in last 24 hours: Temp:  [98.2 F (36.8 C)] 98.2 F (36.8 C) (11/20 1222) Pulse Rate:  [68-124] 96 (11/20 1830) Resp:  [7-21] 13 (11/20 1830) BP: (113-182)/(58-108) 123/62 (11/20 1830) SpO2:  [100 %] 100 % (11/20 1830) General: Awake alert in no distress, appears cachectic HEENT normocephalic atraumatic, poor oral dentition and hygiene CVs: Regular rate rhythm Respiratory: Breathing well saturating normally  on room air Abdomen nondistended nontender Extremities cachectic looking with no edema Neurological exam Awake alert oriented to self Unable to tell me the place or time Is able to name simple objects Is able to repeat simple sentences Follows commands Speech is dysarthric Cranial: Pupils equal round react light, extraocular movements intact, visual fields appear full,  face appears symmetric, facial/intact, auditory daily intact, tongue and palate midline. Motor exam: She is antigravity in all 4 extremities with mildly increased tone in the left upper and lower extremity. Sensory exam: Intact light touch Coordination: Intact finger-nose-finger testing NIH stroke scale 1a Level of Conscious.: 0 1b LOC Questions: 2 1c LOC Commands: 0 2 Best Gaze: 0 3 Visual: 0 4 Facial Palsy: 0 5a Motor Arm - left: 0 5b Motor Arm - Right: 0 6a Motor Leg - Left: 0 6b Motor Leg - Right: 0 7 Limb Ataxia: 0 8 Sensory: 0 9 Best Language: 0 10 Dysarthria: 1 11 Extinct. and Inatten.: 0 TOTAL: 3   Labs I have reviewed labs in epic and the results pertinent to this consultation are:  CBC    Component Value Date/Time   WBC 6.8 03/21/2020 1400   RBC 3.67 (L) 03/21/2020 1400   HGB 11.4 (L) 03/21/2020 1400   HCT 35.1 (L) 03/21/2020 1400   PLT 337 03/21/2020 1400   MCV 95.6 03/21/2020 1400   MCH 31.1 03/21/2020 1400   MCHC 32.5 03/21/2020 1400   RDW 14.0 03/21/2020 1400   LYMPHSABS 2.1 07/08/2019 1947   MONOABS 0.6 07/08/2019 1947   EOSABS 0.1 07/08/2019 1947   BASOSABS 0.0 07/08/2019 1947    CMP     Component Value Date/Time   NA 139 03/21/2020 1400   K 3.6 03/21/2020 1400   CL 96 (L) 03/21/2020 1400   CO2 31 03/21/2020 1400   GLUCOSE 91 03/21/2020 1400   BUN 18 03/21/2020 1400   CREATININE 0.53 03/21/2020 1400   CALCIUM 9.4 03/21/2020 1400   PROT 7.6 03/21/2020 1400   ALBUMIN 4.1 03/21/2020 1400   AST 21 03/21/2020 1400   ALT 10 03/21/2020 1400   ALKPHOS 57 03/21/2020 1400   BILITOT 0.5 03/21/2020 1400   GFRNONAA >60 03/21/2020 1400   GFRAA >60 07/08/2019 1947    Imaging I have reviewed the images obtained:  CT-scan of the brain-right pontine ICH as in pictures below.  Also punctate right thalamic hypodensities likely ICH.     Assessment: 64 year old with above past medical history presenting after a fall and a CT scan of the head noted to  have a pontine ICH-likely hypertensive in etiology. Started on Cleviprex drip and transfer to neurological ICU at [facility]. Likely hypertensive ICH-based on location   Impression: Nontraumatic brainstem intracerebral hemorrhage Hypertensive emergency Diabetes  Recommendations: Admit to neuro ICU Antiplatelets or anticoagulants Repeat brain imaging-ordered MRI brain with and without contrast, MRA head without contrast and MRA neck with and without contrast ordered for 0100 hours tomorrow. Clinically appears stable hence not repeating a CT scan for stability assessment. Goal blood pressure systolic less than 140.  Use Cleviprex drip and titrate as needed. Gentle hydration with normal saline 75 cc an hour 2D echo, A1c, lipid panel. Breathing well and saturating normally on room air-no active issues from a respiratory standpoint- continue to monitor clinically History of diabetes-normal glucose on admission, check A1c, goal A1c less than 7. Use sliding scale insulin as needed Possible aspiration pneumonia-obtain chest x-ray, antibiotics for now, keep n.p.o. PT OT speech therapy Given history  of considerable dysarthria and considerable dysarthria noted on exam -n.p.o. until convincingly passes bedside swallow screen otherwise will need a formal speech evaluation.  CODE STATUS: Full code  Present on admission: ICH, possible aspiration pneumonia  -- Milon Dikes, MD [facility] Pager: [phone]  CRITICAL CARE ATTESTATION Performed by: Milon Dikes, MD Total critical care time: 35 minutes Critical care time was exclusive of separately billable procedures and treating other patients and/or supervising APPs/Residents/Students Critical care was necessary to treat or prevent imminent or life-threatening deterioration due to hypertensive emergency, intracerebral hemorrhage This patient is critically ill and at significant risk for neurological worsening and/or death and care  requires constant monitoring. Critical care was time spent personally by me on the following activities: development of treatment plan with patient and/or surrogate as well as nursing, discussions with consultants, evaluation of patient's response to treatment, examination of patient, obtaining history from patient or surrogate, ordering and performing treatments and interventions, ordering and review of laboratory studies, ordering and review of radiographic studies, pulse oximetry, re-evaluation of patient's condition, participation in multidisciplinary rounds and medical decision making of high complexity in the care of this patient.

## 2020-03-21 NOTE — ED Triage Notes (Signed)
Pt BIB EMS. Pt had unwitnessed fall at facility. Pt has lac above left eye. Hx of dementia. Pt lives at [facility]. No blood thinners.   BP-182/72 HR-74 O2-100% CBG-111

## 2020-03-21 NOTE — ED Provider Notes (Signed)
[facility]-EMERGENCY DEPT Provider Note   CSN: [id number] Arrival date & time: 03/21/20  1142     History Chief Complaint  Patient presents with  . Fall    Brenda Riggs is a 64 y.o. female.  The history is provided by the EMS personnel, a relative, the patient and medical records. No language interpreter was used.  Fall     64 year old demented female prior history of hypertension, stroke, diabetes, COPD, kidney disease brought here via EMS for evaluation of an unwitnessed fall.  Mild patient has history of dementia and prior stroke and she is not on any blood thinner medication.  History is limited as I am unable to obtain any coherent story from patient due to his speech.  I did reach out to her daughter over the phone.  Daughter did mention that patient does have trouble with her speech since her prior stroke but she can comprehend.  Level V caveats applies  Past Medical History:  Diagnosis Date  . Allergy   . Asthma   . Blood transfusion without reported diagnosis   . Chronic kidney disease   . COPD (chronic obstructive pulmonary disease) (HCC)   . Depression   . Diabetes mellitus without complication (HCC)   . Hyperlipidemia   . Hypertension   . Stroke [facility])     Patient Active Problem List   Diagnosis Date Noted  . OSA (obstructive sleep apnea) 07/01/2014  . Cigarette smoker 06/28/2014  . Wheezing 06/27/2014  . MENOPAUSE, SURGICAL 09/09/2009  . INSECT BITE 12/19/2008  . Essential hypertension, benign 09/26/2008  . DIABETES MELLITUS, TYPE II 11/02/2006  . 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 TOBACCO USER 11/02/2006  . MENTAL RETARDATION, MILD 11/02/2006  . ASTHMA 11/02/2006  . HIDRADENITIS SUPPURATIVA 11/02/2006  . HYPERLIPIDEMIA 12/21/2004  . 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 STROKE 10/06/2004  . ATAXIA 08/21/2004  . SYMPTOM, HYPERSOMNIA W/SLEEP APNEA NOS 11/09/1999  . BREAST MASS, LEFT 08/18/1998    Past Surgical  History:  Procedure Laterality Date  . ABDOMINAL HYSTERECTOMY    . APPENDECTOMY    . CESAREAN SECTION    . TUBAL LIGATION       OB History   No obstetric history on file.     Family History  Problem Relation Age of Onset  . Heart disease Mother     Social History   Tobacco Use  . Smoking status: Current Some Day Smoker    Packs/day: 0.10    Years: 41.00    Pack years: 4.10    Types: Cigarettes  . Smokeless tobacco: Never Used  Vaping Use  . Vaping Use: Never used  Substance Use Topics  . Alcohol use: No    Alcohol/week: 0.0 standard drinks  . Drug use: No    Home Medications Prior to Admission medications   Medication Sig Start Date End Date Taking? Authorizing Provider  acetaminophen (TYLENOL) 325 MG tablet Take 650 mg by mouth every 6 (six) hours as needed for headache (minor discomfort/fever up to 100 degrees orally).     [provider]  albuterol (PROVENTIL HFA;VENTOLIN HFA) 108 (90 BASE) MCG/ACT inhaler Inhale 2 puffs into the lungs 4 (four) times daily as needed for wheezing or shortness of breath.     [provider]  Alum & Mag Hydroxide-Simeth (GNP ANTACID REGULAR STRENGTH PO) Take 30 mLs by mouth daily as needed (heartburn/indigestion).    [provider]  beclomethasone (QVAR REDIHALER) 40 MCG/ACT inhaler Inhale 2 puffs into  the lungs 2 (two) times daily.    [provider]  cholecalciferol (VITAMIN D3) 25 MCG (1000 UNIT) tablet Take 1,000 Units by mouth daily.    [provider]  dimenhyDRINATE (DRAMAMINE) 50 MG tablet Take 50 mg by mouth every 4 (four) hours as needed (nausea/vomiting).    [provider]  divalproex (DEPAKOTE ER) 250 MG 24 hr tablet Take 250 mg by mouth at bedtime.    [provider]  fluPHENAZine (PROLIXIN) 5 MG tablet Take 5 mg by mouth at bedtime.     [provider]  fluticasone (FLONASE) 50 MCG/ACT nasal spray Place 1 spray into both nostrils daily.    [provider]  gabapentin (NEURONTIN) 100 MG capsule Take 200 mg by mouth at bedtime.    [provider]  guaifenesin (SILTUSSIN SA) 100 MG/5ML syrup Take 200 mg by mouth every 6 (six) hours as needed for cough.    [provider]  loperamide (IMODIUM A-D) 2 MG tablet Take 2 mg by mouth See admin instructions. Take one tablet (2 mg) by mouth after each loose stool, max 3 tablets (6 mg) in 12 hours, notify MD if diarrhea persists after 12 hours    [provider]  magnesium hydroxide (MILK OF MAGNESIA) 400 MG/5ML suspension Take 7.5 mLs by mouth every 8 (eight) hours as needed (constipation (if no relief in 8 hours give with 6 oz prune juice).    [provider]  metFORMIN (GLUCOPHAGE) 500 MG tablet Take 500 mg by mouth every 12 (twelve) hours.     [provider]  metoprolol (LOPRESSOR) 50 MG tablet Take 50 mg by mouth daily.     [provider]  naproxen (NAPROSYN) 500 MG tablet Take 500 mg by mouth every 12 (twelve) hours as needed (for pain).    [provider]  neomycin-bacitracin-polymyxin (NEOSPORIN) [phone] ointment Apply 1 application topically daily as needed (wound care).    [provider]  Sodium Phosphates (FLEET ENEMA RE) Place 1 enema rectally daily as needed (constipation not relieved by MOM and prune juice).    [provider]    Allergies    Penicillins and Thioridazine hcl  Review of Systems   Review of Systems  Unable to perform ROS: Dementia    Physical Exam Updated Vital Signs BP (!) 164/71 (BP Location: Right Arm)   Pulse 76   Temp 98.2 F (36.8 C) (Oral)   Resp 16   SpO2 100%   Physical Exam Vitals and nursing note reviewed.  Constitutional:      General: She is not in acute distress.    Appearance: She is well-developed.     Comments: Patient lays in bed in no acute discomfort.  HENT:     Head: Normocephalic.     Comments: 2 cm superficial laceration noted to the lateral  aspect of the left eyebrow without any crepitus or active bleeding.    Nose: Nose normal.     Mouth/Throat:     Mouth: Mucous membranes are moist.  Eyes:     Extraocular Movements: Extraocular movements intact.     Conjunctiva/sclera: Conjunctivae normal.     Pupils: Pupils are equal, round, and reactive to light.  Neck:     Comments: C-collar in place, no significant midline spine tenderness. Cardiovascular:     Rate and Rhythm: Normal rate and regular rhythm.     Pulses: Normal pulses.     Heart sounds: Normal heart sounds.  Pulmonary:  Effort: Pulmonary effort is normal.     Breath sounds: Normal breath sounds.  Abdominal:     Palpations: Abdomen is soft.  Musculoskeletal:     Cervical back: Neck supple.     Comments: Able to move all 4 extremities.  Skin:    Findings: No rash.  Neurological:     Mental Status: She is alert.     GCS: GCS eye subscore is 4. GCS verbal subscore is 4. GCS motor subscore is 6.     Cranial Nerves: Cranial nerves are intact.     Sensory: Sensation is intact.     Motor: Motor function is intact.     ED Results / Procedures / Treatments   Labs (all labs ordered are listed, but only abnormal results are displayed) Labs Reviewed  CBC - Abnormal; Notable for the following components:      Result Value   RBC 3.67 (*)    Hemoglobin 11.4 (*)    HCT 35.1 (*)    All other components within normal limits  COMPREHENSIVE METABOLIC PANEL - Abnormal; Notable for the following components:   Chloride 96 (*)    All other components within normal limits  RESP PANEL BY RT-PCR (FLU A&B, COVID) ARPGX2  URINALYSIS, ROUTINE W REFLEX MICROSCOPIC  TYPE AND SCREEN    EKG None  ED ECG REPORT   Date: 03/21/2020  Rate: 76  Rhythm: normal sinus rhythm  QRS Axis: normal  Intervals: QT prolonged  ST/T Wave abnormalities: normal  Conduction Disutrbances:none  Narrative Interpretation:   Old EKG Reviewed: unchanged  I have personally reviewed the EKG  tracing and agree with the computerized printout as noted.   Radiology CT Head Wo Contrast  Result Date: 03/21/2020 CLINICAL DATA:  Unwitnessed fall EXAM: CT HEAD WITHOUT CONTRAST CT CERVICAL SPINE WITHOUT CONTRAST TECHNIQUE: Multidetector CT imaging of the head and cervical spine was performed following the standard protocol without intravenous contrast. Multiplanar CT image reconstructions of the cervical spine were also generated. COMPARISON:  07/08/2019 FINDINGS: CT HEAD FINDINGS Brain: New focal intraparenchymal hemorrhage located within the left paramedian aspect of the pons (series 3, image 13). Area of hemorrhage measures approximately 10 x 9 x 9 mm. There is a small focus of hyperdensity within the left thalamus measuring 2-3 mm (series 3, image 17) which is new from prior and concerning for an additional site of intraparenchymal hemorrhage. No extra-axial collection. No large territory acute infarction. Moderately advanced low-density changes within the periventricular and subcortical white matter compatible with chronic microvascular ischemic change. Moderate diffuse cerebral volume loss. Vascular: Atherosclerotic calcifications involving the large vessels of the skull base. No unexpected hyperdense vessel. Skull: Normal. Negative for fracture or focal lesion. Sinuses/Orbits: No acute finding. Other: None. CT CERVICAL SPINE FINDINGS Alignment: Facet joints are aligned without dislocation or traumatic listhesis. Dens and lateral masses are aligned. Skull base and vertebrae: No acute fracture. No primary bone lesion or focal pathologic process. Soft tissues and spinal canal: No prevertebral fluid or swelling. No visible canal hematoma. Disc levels: Degenerative disc disease with prominent posteriorly oriented osteophytes are again noted at the C5-6 level. Multilevel facet arthropathy most pronounced on the left at C3-4. No interval progression from the prior study. Upper chest: Included lung apices  are clear. Other: Heterogeneously enlarged thyroid gland. Bilateral carotid atherosclerosis. IMPRESSION: 1. New focal intraparenchymal hemorrhage within the pons measuring up to 1.0 cm. Location is suggestive of a hypertensive bleed. 2. Additional 2-3 mm focus of hyperdensity within the left thalamus,  concerning for an additional site of intraparenchymal hemorrhage. 3. No evidence of acute traumatic injury to the cervical spine. 4. Moderately advanced chronic microvascular ischemic changes and cerebral volume loss. 5. Heterogeneously enlarged thyroid gland. Recommend thyroid ultrasound on a nonemergent basis (ref: J Am Coll Radiol. 2015 Feb;12(2): 143-50). Critical Value/emergent results were called by telephone at the time of interpretation on 03/21/2020 at 1:47 pm to provider Dr Adela Lank, who verbally acknowledged these results. Electronically Signed   By: Duanne Guess D.O.   On: 03/21/2020 13:50   CT Cervical Spine Wo Contrast  Result Date: 03/21/2020 CLINICAL DATA:  Unwitnessed fall EXAM: CT HEAD WITHOUT CONTRAST CT CERVICAL SPINE WITHOUT CONTRAST TECHNIQUE: Multidetector CT imaging of the head and cervical spine was performed following the standard protocol without intravenous contrast. Multiplanar CT image reconstructions of the cervical spine were also generated. COMPARISON:  07/08/2019 FINDINGS: CT HEAD FINDINGS Brain: New focal intraparenchymal hemorrhage located within the left paramedian aspect of the pons (series 3, image 13). Area of hemorrhage measures approximately 10 x 9 x 9 mm. There is a small focus of hyperdensity within the left thalamus measuring 2-3 mm (series 3, image 17) which is new from prior and concerning for an additional site of intraparenchymal hemorrhage. No extra-axial collection. No large territory acute infarction. Moderately advanced low-density changes within the periventricular and subcortical white matter compatible with chronic microvascular ischemic change. Moderate  diffuse cerebral volume loss. Vascular: Atherosclerotic calcifications involving the large vessels of the skull base. No unexpected hyperdense vessel. Skull: Normal. Negative for fracture or focal lesion. Sinuses/Orbits: No acute finding. Other: None. CT CERVICAL SPINE FINDINGS Alignment: Facet joints are aligned without dislocation or traumatic listhesis. Dens and lateral masses are aligned. Skull base and vertebrae: No acute fracture. No primary bone lesion or focal pathologic process. Soft tissues and spinal canal: No prevertebral fluid or swelling. No visible canal hematoma. Disc levels: Degenerative disc disease with prominent posteriorly oriented osteophytes are again noted at the C5-6 level. Multilevel facet arthropathy most pronounced on the left at C3-4. No interval progression from the prior study. Upper chest: Included lung apices are clear. Other: Heterogeneously enlarged thyroid gland. Bilateral carotid atherosclerosis. IMPRESSION: 1. New focal intraparenchymal hemorrhage within the pons measuring up to 1.0 cm. Location is suggestive of a hypertensive bleed. 2. Additional 2-3 mm focus of hyperdensity within the left thalamus, concerning for an additional site of intraparenchymal hemorrhage. 3. No evidence of acute traumatic injury to the cervical spine. 4. Moderately advanced chronic microvascular ischemic changes and cerebral volume loss. 5. Heterogeneously enlarged thyroid gland. Recommend thyroid ultrasound on a nonemergent basis (ref: J Am Coll Radiol. 2015 Feb;12(2): 143-50). Critical Value/emergent results were called by telephone at the time of interpretation on 03/21/2020 at 1:47 pm to provider Dr Adela Lank, who verbally acknowledged these results. Electronically Signed   By: Duanne Guess D.O.   On: 03/21/2020 13:50    Procedures .Marland KitchenLaceration Repair  Date/Time: 03/21/2020 2:45 PM Performed by: Fayrene Helper, PA-C Authorized by: Fayrene Helper, PA-C   Consent:    Consent obtained:  Verbal    Consent given by:  Patient   Risks discussed:  Infection, need for additional repair, pain, poor cosmetic result and poor wound healing   Alternatives discussed:  No treatment and delayed treatment Universal protocol:    Procedure explained and questions answered to patient or proxy's satisfaction: yes     Relevant documents present and verified: yes     Test results available and properly labeled: yes  Imaging studies available: yes     Required blood products, implants, devices, and special equipment available: yes     Site/side marked: yes     Immediately prior to procedure, a time out was called: yes     Patient identity confirmed:  Verbally with patient Anesthesia (see MAR for exact dosages):    Anesthesia method:  None Laceration details:    Location:  Face   Face location:  L eyebrow   Length (cm):  2   Depth (mm):  2 Repair type:    Repair type:  Simple Pre-procedure details:    Preparation:  Imaging obtained to evaluate for foreign bodies and patient was prepped and draped in usual sterile fashion Exploration:    Wound exploration: wound explored through full range of motion and entire depth of wound probed and visualized     Wound extent: no foreign bodies/material noted and no underlying fracture noted     Contaminated: no   Skin repair:    Repair method:  Tissue adhesive Approximation:    Approximation:  Close Post-procedure details:    Dressing:  Open (no dressing)   Patient tolerance of procedure:  Tolerated well, no immediate complications  .Critical Care Performed by: Fayrene Helper, PA-C Authorized by: Fayrene Helper, PA-C   Critical care provider statement:    Critical care time (minutes):  45   Critical care was time spent personally by me on the following activities:  Discussions with consultants, evaluation of patient's response to treatment, examination of patient, ordering and performing treatments and interventions, ordering and review of laboratory  studies, ordering and review of radiographic studies, pulse oximetry, re-evaluation of patient's condition, obtaining history from patient or surrogate and review of old charts   (including critical care time)  Medications Ordered in ED Medications  lidocaine-EPINEPHrine (XYLOCAINE W/EPI) 2 %-1:100000 (with pres) injection 20 mL (has no administration in time range)    ED Course  I have reviewed the triage vital signs and the nursing notes.  Pertinent labs & imaging results that were available during my care of the patient were reviewed by me and considered in my medical decision making (see chart for details).    MDM Rules/Calculators/A&P                          BP 139/72   Pulse 78   Temp 98.2 F (36.8 C) (Oral)   Resp 17   SpO2 100%   Final Clinical Impression(s) / ED Diagnoses Final diagnoses:  Traumatic hemorrhage of cerebrum without loss of consciousness, unspecified laterality, initial encounter (HCC)  Face lacerations, initial encounter    Rx / DC Orders ED Discharge Orders    None     12:55 PM Patient with prior stroke and dementia here for evaluation of unwitnessed fall.  She does have a superficial laceration to her left eyebrow that will need to be repaired.  She appears to understand and follows command however her speech is garbled and not coherent.  I talked to the daughter and it appears that this is not new.  Will check UA, EKG, obtain head and cervical spine CT.  2:01 PM Head and cervical spine CT demonstrate new focal intraparenchymal hemorrhage within the pons measuring up to 1 cm.  The location is suggestive of hypertensive bleed.  Additional 2-3 mm focus of hyperdensity within the left thalamus concerning for additional sites of intraparenchymal hemorrhage.  No evidence of acute traumatic injury to the  cervical spine.  Given this finding, will consult neurosurgery for recommendation.  We will order appropriate labs,.  2:43 PM Appreciate consultation  from neurosurgeon Dr. Jake Samplesawley who request neurology to work pt up, including repeat head CT tomorrow.  He does not think pt needs any emergent neurosurgery intervention at this time.   3:09 PM Appreciate consultation from on call neurologist Dr. Otelia LimesLindzen who recommend [facility]ing pt [facility] ([address]), and to start pt on Clevidipine drip with goal SBP <140.    3:15 PM Family notified of the result.  Pt is full code.  She is protecting her airway at this time.  Screening covid test ordered.    Fayrene Helperran, [place], PA-C 03/21/20 1515    Melene PlanFloyd, Dan, DO 03/22/20 [phone]

## 2020-03-21 NOTE — Consult Note (Signed)
   Providing Compassionate, Quality Care - Together  Neurosurgery Consult  Referring physician: [facility] Reason for referral: ICH  Chief Complaint: Fall  History of Present Illness: This is a 64 year old female with a past medical history of strokes, diabetes, hypertension, hyperlipidemia, COPD that presented to [facility] for evaluation of a fall.  She is a poor historian therefore majority of the history is per the chart.  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  She was transferred to [facility] for stroke neurology/neurosurgery evaluation.   Medications: I have reviewed the patient's current medications. Allergies: No Known Allergies  History reviewed. No pertinent family history. Social History:  has no history on file for tobacco use, alcohol use, and drug use.  ROS: Unable to obtain  Physical Exam:  Vital signs in last 24 hours: Temp:  [98 F (36.7 C)-98.3 F (36.8 C)] 98 F (36.7 C) (07/25 1814) Pulse Rate:  [58-128] 65 (07/26 0746) Resp:  [11-18] 14 (07/26 0217) BP: (138-182)/(65-125) 153/88 (07/26 0700) SpO2:  [91 %-98 %] 96 % (07/26 0746) PE: Awake alert, disoriented (oriented to self) Face symmetric PERRLA EOMI Right-sided lateral gaze nystagmus Cranial nerves II through XII intact Follows simple commands Bilateral upper/lower extremities 4/5 Dysarthria   Imaging: CT brain reviewed.  Small pontine intraparenchymal hemorrhage noted without significant mass-effect or hydrocephalus.  There is also hypodensity in the left thalamus. 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  Please do not hesitate to call with questions or concerns.   Monia Pouch,  DO Neurosurgeon [facility] Cell: [phone]

## 2020-03-22 ENCOUNTER — Inpatient Hospital Stay (HOSPITAL_COMMUNITY): Payer: Medicaid Other

## 2020-03-22 DIAGNOSIS — I6389 Other cerebral infarction: Secondary | ICD-10-CM

## 2020-03-22 DIAGNOSIS — I613 Nontraumatic intracerebral hemorrhage in brain stem: Secondary | ICD-10-CM | POA: Diagnosis not present

## 2020-03-22 LAB — URINALYSIS, ROUTINE W REFLEX MICROSCOPIC
Bilirubin Urine: NEGATIVE
Glucose, UA: NEGATIVE mg/dL
Hgb urine dipstick: NEGATIVE
Ketones, ur: NEGATIVE mg/dL
Leukocytes,Ua: NEGATIVE
Nitrite: NEGATIVE
Protein, ur: NEGATIVE mg/dL
Specific Gravity, Urine: 1.01 (ref 1.005–1.030)
pH: 7 (ref 5.0–8.0)

## 2020-03-22 LAB — ECHOCARDIOGRAM COMPLETE
Area-P 1/2: 1.89 cm2
Calc EF: 77.9 %
S' Lateral: 1.3 cm
Single Plane A2C EF: 72.9 %
Single Plane A4C EF: 82.2 %

## 2020-03-22 LAB — GLUCOSE, CAPILLARY
Glucose-Capillary: 71 mg/dL (ref 70–99)
Glucose-Capillary: 92 mg/dL (ref 70–99)
Glucose-Capillary: 112 mg/dL — ABNORMAL HIGH (ref 70–99)
Glucose-Capillary: 156 mg/dL — ABNORMAL HIGH (ref 70–99)

## 2020-03-22 LAB — LIPID PANEL
Cholesterol: 200 mg/dL (ref 0–200)
HDL: 50 mg/dL (ref >40)
LDL Cholesterol: 138 mg/dL — ABNORMAL HIGH (ref 0–99)
Total CHOL/HDL Ratio: 4 RATIO
Triglycerides: 61 mg/dL (ref <150)
VLDL: 12 mg/dL (ref 0–40)

## 2020-03-22 LAB — PROTIME-INR
INR: 1 (ref 0.8–1.2)
Prothrombin Time: 12.7 seconds (ref 11.4–15.2)

## 2020-03-22 LAB — APTT: aPTT: 29 seconds (ref 24–36)

## 2020-03-22 LAB — HIV ANTIBODY (ROUTINE TESTING W REFLEX): HIV Screen 4th Generation wRfx: NONREACTIVE

## 2020-03-22 MED ORDER — COLLAGENASE 250 UNIT/GM EX OINT
1.0000 "application " | TOPICAL_OINTMENT | Freq: Every day | CUTANEOUS | Status: DC
  Administered 2020-03-22 – 2020-03-25 (×4): 1 via TOPICAL
  Filled 2020-03-22 (×2): qty 30

## 2020-03-22 MED ORDER — PANTOPRAZOLE SODIUM 40 MG PO PACK
40.0000 mg | PACK | Freq: Every day | ORAL | Status: DC
  Administered 2020-03-22 – 2020-03-24 (×3): 40 mg via ORAL
  Filled 2020-03-22 (×3): qty 20

## 2020-03-22 MED ORDER — COLLAGENASE 250 UNIT/GM EX OINT
1.0000 "application " | TOPICAL_OINTMENT | CUTANEOUS | Status: DC | PRN
  Filled 2020-03-22: qty 30

## 2020-03-22 MED ORDER — INFLUENZA VAC SPLIT QUAD 0.5 ML IM SUSY
0.5000 mL | PREFILLED_SYRINGE | INTRAMUSCULAR | Status: AC
  Administered 2020-03-24: 0.5 mL via INTRAMUSCULAR
  Filled 2020-03-22: qty 0.5

## 2020-03-22 MED ORDER — METOPROLOL TARTRATE 50 MG PO TABS
50.0000 mg | ORAL_TABLET | Freq: Two times a day (BID) | ORAL | Status: DC
  Administered 2020-03-22 – 2020-03-25 (×6): 50 mg via ORAL
  Filled 2020-03-22 (×6): qty 1

## 2020-03-22 NOTE — Evaluation (Signed)
Clinical/Bedside Swallow Evaluation Patient Details  Name: MELLONIE GUESS MRN: [id number] Date of Birth: 08-18-1955  Today's Date: 03/22/2020 Time: SLP Start Time (ACUTE ONLY): 1045 SLP Stop Time (ACUTE ONLY): 1100 SLP Time Calculation (min) (ACUTE ONLY): 15 min  Past Medical History:  Past Medical History:  Diagnosis Date  . Allergy   . Asthma   . Blood transfusion without reported diagnosis   . Chronic kidney disease   . COPD (chronic obstructive pulmonary disease) (HCC)   . Depression   . Diabetes mellitus without complication (HCC)   . Hyperlipidemia   . Hypertension   . Stroke [facility])    Past Surgical History:  Past Surgical History:  Procedure Laterality Date  . ABDOMINAL HYSTERECTOMY    . APPENDECTOMY    . CESAREAN SECTION    . TUBAL LIGATION     HPI:  SIERRIA BRUNEY is a 64 y.o. female past medical history of prior strokes with residual dysarthria, diabetes, hypertension, hyperlipidemia, COPD, presented to the emergency room at [facility] for evaluation of a witnessed fall.  She also has a history of dementia from her prior stroke.  She is not able to provide any reliable history.  She was evaluated in the emergency room, had a left forehead laceration that was repaired.  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  No C-spine injury on C-spine CT scan.  Most recent chest xray was showing no active disease.  MRI of  the brain was showing the following:  1 cm acute intraparenchymal hemorrhage centered at the left pons, relatively stable in size and morphology from prior CT. No underlying lesion or significant enhancement seen. Given distribution, a hypertensive bleed is favored.  2. Underlying age-related cerebral atrophy with advanced chronic microvascular ischemic disease and multiple remote lacunar infarcts involving the hemispheric cerebral white matter, basal  ganglia, thalami, and pons. 3. Innumerable superimposed chronic micro hemorrhages clustered about the deep gray nuclei, brainstem, and dentate nuclei, most likely reflecting sequelae of poorly controlled hypertension.  Assessment / Plan / Recommendation Clinical Impression  Clinical swallowing evaluation was completed using thin liquids via spoon, cup and small, single straw sips and pureed material.  The patient is known to ST service from previous admission with MBS completed on 06/28/2019.  A dysphagia 3 diet with thin liquids was recommended at that time.  Penetration was seen given large serial sips of liquids while attempting to take a pill.   Per her daughter whom was present the patient has been on a pureed diet at the SNF.  They reported it has been going well.  Cranial nerve exam was completed and remarkable for generalized weakness and reduced range of motion of the structures.  Movements were slow.  Vocal volume was low.  She presented with a probable oral dysphagia.  Anterior excape was seen across textures but was worse for liquids.  She appeared mildly slow to clear pureed material from her oral cavity.  Swallow trigger was appreciated to palpation.  Overt s/s of aspiration were not seen.  She did better when allowed to take 1 small sip at a time from a straw then when given liquids via therapist led cup sips of thin liquids.  Recommend that she continue on a dysphagia 1 diet with thin liquids.  Suggest medications whole in pureed material.  ST will follow briefly for therapeutic diet  tolerance.      SLP Visit Diagnosis: Dysphagia, unspecified (R13.10)    Aspiration Risk  Mild aspiration risk    Diet Recommendation   Dysphagia 1 with thin liquids  Medication Administration: Whole meds with puree    Other  Recommendations Oral Care Recommendations: Oral care BID   Follow up Recommendations None      Frequency and Duration min 1 x/week  2 weeks       Prognosis Prognosis for Safe  Diet Advancement: Guarded Barriers to Reach Goals: Time post onset;Severity of deficits      Swallow Study   General Date of Onset: 03/21/20 HPI: SANDHYA DENHERDER is a 64 y.o. female past medical history of prior strokes with residual dysarthria, diabetes, hypertension, hyperlipidemia, COPD, presented to the emergency room at [facility] for evaluation of a witnessed fall.  She also has a history of dementia from her prior stroke.  She is not able to provide any reliable history.  She was evaluated in the emergency room, had a left forehead laceration that was repaired.  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  No C-spine injury on C-spine CT scan.  Most recent chest xray was showing no active disease.  MRI of  the brain was showing the following:  1 cm acute intraparenchymal hemorrhage centered at the left pons, Type of Study: Bedside Swallow Evaluation Previous Swallow Assessment: MBS dated 06/28/19  Rx for D3/thin  Single sips Diet Prior to this Study: Dysphagia 1 (puree);Thin liquids Temperature Spikes Noted: No Respiratory Status: Room air History of Recent Intubation: No Behavior/Cognition: Alert;Cooperative;Pleasant mood;Requires cueing Oral Cavity Assessment: Within Functional Limits Oral Care Completed by SLP: No Self-Feeding Abilities: Total assist Patient Positioning: Upright in bed Baseline Vocal Quality: Low vocal intensity Volitional Swallow: Able to elicit    Oral/Motor/Sensory Function Overall Oral Motor/Sensory Function: Moderate impairment Facial ROM: Within Functional Limits Facial Symmetry: Within Functional Limits Facial Strength: Within Functional Limits Lingual ROM: Within Functional Limits Lingual Symmetry: Within Functional Limits Lingual Strength: Reduced Mandible: Impaired   Ice Chips Ice chips: Not tested   Thin Liquid Thin Liquid:  Impaired Presentation: Straw;Spoon;Cup Oral Phase Impairments: Reduced labial seal;Reduced lingual movement/coordination Oral Phase Functional Implications: Left anterior spillage;Right anterior spillage;Prolonged oral transit    Nectar Thick Nectar Thick Liquid: Not tested   Honey Thick Honey Thick Liquid: Not tested   Puree Puree: Impaired Presentation: Spoon Oral Phase Impairments: Reduced labial seal;Reduced lingual movement/coordination Oral Phase Functional Implications: Right anterior spillage;Left anterior spillage;Prolonged oral transit   Solid     Solid: Not tested      Dimas Aguas, MA, CCC-SLP Acute Rehab SLP [phone]  Fleet Contras 03/22/2020,11:27 AM

## 2020-03-22 NOTE — Consult Note (Signed)
WOC Nurse Consult Note: Reason for Consult:Stage 2 and Stage 3 Pressure injuries.  I am assisted in my consultation today by the patient's Bedside RN, P. Borgio. Her assistance is appreciated. Wound type:Pressure Pressure Injury POA: Yes Measurement: Coccygeal (Stage 2): 3cm x 2cm. Pink, moist wound bed.  Right buttocks (Stage 2): 2cm x 2cm, Pink moist wound bed Left ischial tuberosity (Stage 3): 5cm x 3cm with 40% yellow slough in wound bed, 60% pink, moist wound bed Wound bed:As described above Drainage (amount, consistency, odor) Small serous Periwound:Intact and with evidence of previous wound healing Dressing procedure/placement/frequency: Pateint is on a mattress replacement in the ICU. I have provided Nursing with topical care guidance and positioning guidance, as well as Prevalon Boots and a pressure redistribution chair cushion.   WOC nursing team will not follow, but will remain available to this patient, the nursing and medical teams.  Please re-consult if needed. Thanks, Ladona Mow, MSN, RN, GNP, [place]  Pager# [phone]

## 2020-03-22 NOTE — Progress Notes (Signed)
  Echocardiogram 2D Echocardiogram has been performed.  Janalyn Harder 03/22/2020, 9:08 AM

## 2020-03-22 NOTE — Progress Notes (Signed)
STROKE TEAM PROGRESS NOTE   HISTORY OF PRESENT ILLNESS (per record) Brenda Riggs is a 64 y.o. female past medical history of prior strokes with residual dysarthria, diabetes, hypertension, hyperlipidemia, COPD, presented to the emergency room at [facility] for evaluation of a witnessed fall.  She also has a history of dementia from her    She is not able to provide any reliable history. She was evaluated in the emergency room, had a left forehead laceration that was repaired.  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  No C-spine injury on C-spine CT scan. She was transferred over to [facility] for neurological evaluation and admission. Patient has significant dysarthria on examination.  No family at bedside No neurological notes in the past in our electronic medical record or Care Everywhere. Denies headaches.  Denies chest pain.  Denies shortness of breath.  Denies fevers or chills. Denies having had loss consciousness but I think she is an unreliable historian LKW: Unclear tpa given?: no, ICH Premorbid modified Rankin scale (mRS): Unable to ascertain ICH score-1 for infratentorial origin   INTERVAL HISTORY She is resting well in bed and has no complaints.     OBJECTIVE Vitals:   03/22/20 0600 03/22/20 0615 03/22/20 0630 03/22/20 0645  BP: (!) 115/57 121/61 111/67 116/65  Pulse: 64 69 71 68  Resp: 12 13 11 12   Temp:      TempSrc:      SpO2: 100% 99% 98% 99%    CBC:  Recent Labs  Lab 03/21/20 1400  WBC 6.8  HGB 11.4*  HCT 35.1*  MCV 95.6  PLT 337    Basic Metabolic Panel:  Recent Labs  Lab 03/21/20 1400  NA 139  K 3.6  CL 96*  CO2 31  GLUCOSE 91  BUN 18  CREATININE 0.53  CALCIUM 9.4    Lipid Panel:     Component Value Date/Time   CHOL 200 03/21/2020 2320   TRIG 61 03/21/2020 2320   HDL 50 03/21/2020 2320   CHOLHDL 4.0 03/21/2020  2320   VLDL 12 03/21/2020 2320   LDLCALC 138 (H) 03/21/2020 2320   HgbA1c:  Lab Results  Component Value Date   HGBA1C 5.3 03/21/2020   Urine Drug Screen:     Component Value Date/Time   LABOPIA NONE DETECTED 03/28/2017 2315   COCAINSCRNUR NONE DETECTED 03/28/2017 2315   LABBENZ NONE DETECTED 03/28/2017 2315   AMPHETMU NONE DETECTED 03/28/2017 2315   THCU NONE DETECTED 03/28/2017 2315   LABBARB NONE DETECTED 03/28/2017 2315    Alcohol Level     Component Value Date/Time   ETH <10 03/28/2017 2139    IMAGING  CT Head Wo Contrast 03/21/2020 IMPRESSION:  1. New focal intraparenchymal hemorrhage within the pons measuring up to 1.0 cm. Location is suggestive of a hypertensive bleed.  2. Additional 2-3 mm focus of hyperdensity within the left thalamus, concerning for an additional site of intraparenchymal hemorrhage.  3. No evidence of acute traumatic injury to the cervical spine.  4. Moderately advanced chronic microvascular ischemic changes and cerebral volume loss.  5. Heterogeneously enlarged thyroid gland. Recommend thyroid ultrasound on a nonemergent basis (ref: J Am Coll Radiol. 2015 Feb;12(2): 143-50).   CT Cervical Spine Wo Contrast 03/21/2020 IMPRESSION: No evidence of acute traumatic injury to the cervical spine.    MR BRAIN W WO CONTRAST  MR ANGIO HEAD WO CONTRAST MR ANGIO NECK W WO CONTRAST 03/22/2020 IMPRESSION:   MRI HEAD  IMPRESSION:  1. 1 cm acute intraparenchymal hemorrhage centered at the left pons, relatively stable in size and morphology from prior CT. No underlying lesion or significant enhancement seen. Given distribution, a hypertensive bleed is favored.  2. Underlying age-related cerebral atrophy with advanced chronic microvascular ischemic disease and multiple remote lacunar infarcts involving the hemispheric cerebral white matter, basal ganglia, thalami, and pons.  3. Innumerable superimposed chronic micro hemorrhages clustered about the deep  gray nuclei, brainstem, and dentate nuclei, most likely reflecting sequelae of poorly controlled hypertension.   MRA HEAD  IMPRESSION:  1. Negative intracranial MRA for large vessel occlusion. No hemodynamically significant or correctable stenosis.  2. Moderate small vessel atheromatous irregularity throughout the intracranial circulation.   MRA NECK  IMPRESSION:  1. Short-segment 35% atheromatous stenosis at the origin of the right ICA.  2. Otherwise wide patency of both carotid artery systems within the neck.  3. Wide patency of both vertebral arteries within the neck. Left vertebral artery dominant.   Chest Port 1 View 03/21/2020 IMPRESSION:  No active disease.   Transthoracic Echocardiogram  00/00/2021 Pending  ECG - SR rate 76 BPM. (See cardiology reading for complete details)  PHYSICAL EXAM Blood pressure 116/65, pulse 68, temperature 98.3 F (36.8 C), temperature source Axillary, resp. rate 12, SpO2 99 %. GENERAL: She is thin somewhat undernourished appearing and in no acute distress at this time.  HEENT: Neck is supple and no trauma noted.  ABDOMEN: soft  EXTREMITIES: No edema   BACK: Normal  SKIN: Normal by inspection.    MENTAL STATUS: She is awake and cooperates with evaluation.  There is severe paucity of speech although she follows commands.  There appears to be mild dysarthria.  CRANIAL NERVES: Pupils are equal, round and reactive to light and accomodation; extra ocular movements are full, there is no significant nystagmus; visual fields are full; upper and lower facial muscles are normal in strength and symmetric, there is no flattening of the nasolabial folds; tongue is midline; uvula is midline; shoulder elevation is normal.  MOTOR: There is mild drift of the left upper extremity.  This is associated with mild weakness 4 -/5.  There is also 4 -/5 weakness of the left lower extremity.  The right side shows normal strength 5/5 both upper and lower extremities  but with increased tone.  COORDINATION: Left finger to nose is normal, right finger to nose is normal, No rest tremor; no intention tremor; no postural tremor; no bradykinesia.  SENSATION: Responds to painful stimuli bilaterally.        MRI is reviewed in person and shows mixed signal lesion involving the left pontine tegmental area on SWI, T1 and DWI.  SWI also shows numerous areas of low signal involving deep gray matter structures bilaterally indicating numerous microhemorrhage.   ASSESSMENT/PLAN Ms. POPPY MCAFEE is a 64 y.o. female with history of prior strokes with residual dysarthria, ongoing tobacco use, diabetes, dementia (unreliable historian), hypertension, hyperlipidemia, COPD, who presented to the emergency room at [facility] for evaluation of a witnessed fall resulting in a left forehead laceration. 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 She did not receive IV t-PA due to ICH  Focal intraparenchymal hemorrhage within the pons measuring up to 1.0 cm. Location is suggestive of a hypertensive bleed. Additional  2-3 mm focus of hyperdensity within the left thalamus, concerning for an additional site of intraparenchymal hemorrhage.  Resultant dysarthria and left hemiparesis  Code Stroke CT Head - not ordered  CT head -  New focal intraparenchymal hemorrhage within the pons measuring up to 1.0 cm. Location is suggestive of a hypertensive bleed. Additional 2-3 mm focus of hyperdensity within the left thalamus, concerning for an additional site of intraparenchymal hemorrhage. Moderately advanced chronic microvascular ischemic changes and cerebral volume loss.  MRI head - 1 cm acute intraparenchymal hemorrhage centered at the left pons, relatively stable in size and morphology from prior CT. No underlying lesion or significant enhancement seen. Given distribution, a hypertensive bleed is favored.  Underlying age-related cerebral atrophy with advanced chronic microvascular ischemic disease and multiple remote lacunar infarcts involving the hemispheric cerebral white matter, basal ganglia, thalami, and pons. Innumerable superimposed chronic micro hemorrhages clustered about the deep gray nuclei, brainstem, and dentate nuclei, most likely reflecting sequelae of poorly controlled hypertension.   MRA head - Negative intracranial MRA for large vessel occlusion. No hemodynamically significant or correctable stenosis. Moderate small vessel atheromatous irregularity throughout the intracranial circulation.   MRA Neck - Short-segment 35% atheromatous stenosis at the origin of the right ICA. Otherwise wide patency of both carotid artery systems within the neck. Wide patency of both vertebral arteries within the neck. Left vertebral artery dominant.  CTA H&N - not ordered  CT Perfusion - not ordered  Carotid Doppler - MRA neck ordered - carotid dopplers not indicated  2D Echo - pending  Sars Corona Virus 2 - negative  LDL - 138  HgbA1c - 5.3  UDS - not ordered  VTE prophylaxis - SCDs Diet  Diet Order            DIET - DYS 1 Room service appropriate? Yes with Assist; Fluid consistency: Thin  Diet effective now                  No antithrombotic prior to admission, now on No antithrombotic  Ongoing aggressive stroke risk factor management  Therapy recommendations:  pending  Disposition:  Pending  Hypertension  Home BP meds: metoprolol  Current BP meds: prn labetalol ; prn Cleviprex  Stable  SBP goal < 140 initially . Long-term BP goal normotensive  Hyperlipidemia  Home Lipid lowering medication: none  LDL 138, goal < 70  Current lipid lowering medication: None (statin contraindicated with ICH)  Continue statin at discharge  Diabetes  Home diabetic meds: metformin  Current diabetic meds: insulin  HgbA1c 5.3, goal < 7.0 Recent Labs    03/21/20 2307   GLUCAP 74     Other Stroke Risk Factors  Advanced age  Cigarette smoker - advised to stop smoking  Obesity, There is no height or weight on file to calculate BMI., recommend weight loss, diet and exercise as appropriate   Hx stroke/TIA  Other Active Problems  Code status - Full code  NS consult - Troy Dawley, DO - 03/21/20 - No neurosurgical intervention recommended   Heterogeneously enlarged thyroid gland. Recommend thyroid ultrasound on a nonemergent basis (ref: J Am Coll Radiol. 2015 Feb;12(2): 143-50).   Forehead laceration  Dementia   Hospital day # 1  This patient is critically ill due to pontine hemorrhage and at significant risk of neurological worsening, death form severe anemia, bleeding, recurrent stroke, intracranial stenosis. This patient's care requires constant monitoring of vital signs, hemodynamics, respiratory and cardiac monitoring, review of multiple databases, neurological assessment, other  specialists and medical decision making of high complexity. I spent 35 minutes of neurocritical care time in the care of this patient   To contact Stroke Continuity provider, please refer to [email or web address]. After hours, contact General Neurology

## 2020-03-22 NOTE — Progress Notes (Signed)
PT Cancellation Note  Patient Details Name: Brenda Riggs MRN: [id number] DOB: [birth year]   Cancelled Treatment:    Reason Eval/Treat Not Completed: Active bedrest order. PT will attempt to evaluate once off bedrest.   Arlyss Gandy 03/22/2020, 3:48 PM

## 2020-03-22 NOTE — Progress Notes (Signed)
Patient with 3 wounds from PTA. All areas measured and covered. Wound care nurse consulted.

## 2020-03-22 NOTE — Evaluation (Signed)
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   TOBACCO USER 11/02/2006   MENTAL RETARDATION, MILD 11/02/2006   ASTHMA 11/02/2006   HIDRADENITIS SUPPURATIVA 11/02/2006   HYPERLIPIDEMIA 12/21/2004   INCONTINENCE, 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/20/2004   STROKE 10/06/2004   ATAXIA 08/21/2004   SYMPTOM, HYPERSOMNIA W/SLEEP APNEA NOS 11/09/1999   BREAST MASS, LEFT 08/18/1998   Past Medical History:  Past Medical History:  Diagnosis Date   Allergy    Asthma    Blood transfusion without reported diagnosis    Chronic kidney disease    COPD (chronic obstructive pulmonary disease) (HCC)    Depression    Diabetes mellitus without complication (HCC)    Hyperlipidemia    Hypertension    Stroke [facility])    Past Surgical History:  Past Surgical History:  Procedure Laterality Date   ABDOMINAL HYSTERECTOMY     APPENDECTOMY     CESAREAN SECTION     TUBAL LIGATION     HPI:  Brenda Riggs is a 64 y.o. female past medical history of prior strokes with residual dysarthria, diabetes, hypertension, hyperlipidemia, COPD, presented to the emergency room at [facility] for evaluation of a witnessed fall.  She also has a history of dementia from her prior stroke.  She is not able to provide any reliable history.  She was evaluated in the emergency room, had a left forehead laceration that was repaired.  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  No C-spine injury on C-spine CT scan.  Most recent chest xray was showing no active disease.  MRI of  the brain was showing the following:  1 cm acute intraparenchymal hemorrhage centered at the left pons, relatively stable in size and morphology from prior CT. No underlying lesion or significant enhancement seen. Given distribution, a hypertensive bleed is favored.  2. Underlying age-related cerebral atrophy with advanced chronic microvascular ischemic disease and multiple remote lacunar infarcts involving the hemispheric cerebral white matter, basal ganglia, thalami, and pons. 3. Innumerable superimposed chronic micro hemorrhages clustered about the deep gray nuclei, brainstem, and dentate nuclei, most likely reflecting sequelae of poorly controlled hypertension.  Assessment / Plan / Recommendation Clinical Impression  Language evaluation and motor speech screen were completed.  Cranial nerve exam was completed and remarkable for generalized weakness and reduced range of motion of the structures.  Movements were slow.  Vocal volume was low.   The patient's speech was dysarthric and difficult to understand at times.  Per the daughter whom was present it was baseline.  She achieved an overall score of 66/80 on the [place] Aphasia Screening Test.  Per her daughter her deficits are baseline.  Her daughter reported that her processing time is slow and that she needs to be given directions one step at a time and then given time to respond.  She was able to name objects, perform automatic speech tasks,  repeat words/phrases, identify objects, and answer simple yes/no questions.  She was able to follow one step commands.  She was unable to follow two step commands.  This carried over to reading and following instructions.  As complexity increased breakdown was noted.  Same was true for yes/no  questions.  As complexity increased breakdown was seen.  Per her daughter this is baseline.  Given this ST will not follow during acute stay.  She may benefit from ST involvement at the SNF to maximize her functional status.      SLP Assessment  SLP Visit Diagnosis: Aphasia (R47.01)    Follow Up Recommendations  Skilled Nursing facility    Frequency and Duration No follow up for language/dysarthria during acute stay.         SLP Evaluation Cognition  Orientation Level: Oriented to person;Oriented to situation;Disoriented to place;Disoriented to time       Comprehension  Auditory Comprehension Overall Auditory Comprehension: Impaired at baseline Yes/No Questions: Impaired Basic Biographical Questions: 76-100% accurate Complex Questions: 50-74% accurate Commands: Impaired One Step Basic Commands: 75-100% accurate Two Step Basic Commands: 0-24% accurate Conversation: Simple Visual Recognition/Discrimination Discrimination: Not tested Reading Comprehension Reading Status: Within funtional limits    Expression Expression Primary Mode of Expression: Verbal Verbal Expression Overall Verbal Expression: Impaired at baseline Initiation: Impaired Automatic Speech: Name;Social Response;Counting;Day of week Level of Generative/Spontaneous Verbalization: Word;Phrase Repetition: No impairment Naming: No impairment Pragmatics: No impairment Non-Verbal Means of Communication: Not applicable   Oral / Motor  Oral Motor/Sensory Function Overall Oral Motor/Sensory Function: Moderate impairment Facial ROM: Within Functional Limits Facial Symmetry: Within Functional Limits Facial Strength: Within Functional Limits Lingual ROM: Within Functional Limits Lingual Symmetry: Within Functional Limits Lingual Strength: Reduced Mandible: Impaired Motor Speech Overall Motor Speech: Impaired at baseline   GO                   Dimas Aguas, MA, CCC-SLP Acute Rehab SLP [phone]  Fleet Contras 03/22/2020, 11:38 AM

## 2020-03-23 DIAGNOSIS — L8990 Pressure ulcer of unspecified site, unspecified stage: Secondary | ICD-10-CM | POA: Insufficient documentation

## 2020-03-23 DIAGNOSIS — I613 Nontraumatic intracerebral hemorrhage in brain stem: Secondary | ICD-10-CM | POA: Diagnosis not present

## 2020-03-23 LAB — GLUCOSE, CAPILLARY
Glucose-Capillary: 86 mg/dL (ref 70–99)
Glucose-Capillary: 178 mg/dL — ABNORMAL HIGH (ref 70–99)
Glucose-Capillary: 154 mg/dL — ABNORMAL HIGH (ref 70–99)
Glucose-Capillary: 153 mg/dL — ABNORMAL HIGH (ref 70–99)
Glucose-Capillary: 88 mg/dL (ref 70–99)

## 2020-03-23 MED ORDER — AMLODIPINE BESYLATE 5 MG PO TABS
5.0000 mg | ORAL_TABLET | Freq: Every day | ORAL | Status: DC
  Administered 2020-03-23 – 2020-03-25 (×3): 5 mg via ORAL
  Filled 2020-03-23 (×3): qty 1

## 2020-03-23 MED ORDER — GABAPENTIN 100 MG PO CAPS
200.0000 mg | ORAL_CAPSULE | Freq: Every day | ORAL | Status: DC
  Administered 2020-03-23 – 2020-03-24 (×2): 200 mg via ORAL
  Filled 2020-03-23 (×3): qty 2

## 2020-03-23 MED ORDER — FLUPHENAZINE HCL 5 MG PO TABS
5.0000 mg | ORAL_TABLET | Freq: Every day | ORAL | Status: DC
  Administered 2020-03-24 (×2): 5 mg via ORAL
  Filled 2020-03-23 (×4): qty 1

## 2020-03-23 MED ORDER — DIVALPROEX SODIUM ER 250 MG PO TB24
250.0000 mg | ORAL_TABLET | Freq: Every day | ORAL | Status: DC
  Administered 2020-03-24 (×2): 250 mg via ORAL
  Filled 2020-03-23 (×4): qty 1

## 2020-03-23 MED ORDER — LABETALOL HCL 5 MG/ML IV SOLN
10.0000 mg | INTRAVENOUS | Status: DC | PRN
  Administered 2020-03-23: 10 mg via INTRAVENOUS
  Filled 2020-03-23: qty 4

## 2020-03-23 NOTE — Evaluation (Signed)
Physical Therapy Evaluation Patient Details Name: Brenda Riggs MRN: [id number] DOB: 1955-12-08 Today's Date: 03/23/2020   History of Present Illness  Pt is a 64 year old female with a past medical history of strokes, dementia, diabetes, hypertension, hyperlipidemia, COPD that presented to [facility] for evaluation after a fall. Imaging revealed a left pontine ICH with no significant mass-effect as well as a left hyperdensity in the thalamus.     Clinical Impression  Pt was at SNF PTA and essentially w/c bound however pt was able to stand and side step today. Per daughter, the facility states they feed her however she was able to feed herself today when set up in chair position in bed. Daughter is not happy with the current facility and reports she was using a RW up until 3 months ago when they started just keeping her in the w/c. Despite known cognitive deficits pt able to follow commands and initiate tasks asked. Acute PT to cont to follow to progress mobility.    Follow Up Recommendations SNF;Supervision/Assistance - 24 hour (daughter wants another facility)    Equipment Recommendations  None recommended by PT (pt in w/c at facility)    Recommendations for Other Services       Precautions / Restrictions Precautions Precautions: Fall Restrictions Weight Bearing Restrictions: No      Mobility  Bed Mobility Overal bed mobility: Needs Assistance Bed Mobility: Rolling;Sidelying to Sit;Sit to Supine Rolling: Max assist Sidelying to sit: Max assist;+2 for physical assistance   Sit to supine: Max assist;+2 for physical assistance   General bed mobility comments: pt initiated rolling to the L, had increased difficulty rolling to the R, maxAx2 for LE management off EOB and trunk elevation    Transfers Overall transfer level: Needs assistance Equipment used:  (2 person lift with gait belt) Transfers: Sit to/from Stand Sit to Stand: Mod assist;+2 physical assistance          General transfer comment: pt initiated power up, modAx2 to achieve full upright standing  Ambulation/Gait             General Gait Details: pt took 4 side steps to head of bed with max directional verbal cues, modA to initiate R LE movement  Stairs            Wheelchair Mobility    Modified Rankin (Stroke Patients Only)       Balance Overall balance assessment: Needs assistance Sitting-balance support: Feet supported;No upper extremity supported Sitting balance-Leahy Scale: Poor Sitting balance - Comments: pt requiring external assist to maintain midline Postural control: Posterior lean                                   Pertinent Vitals/Pain Pain Assessment: Faces (reports pain in her buttocks) Faces Pain Scale: No hurt    Home Living Family/patient expects to be discharged to:: Assisted living     Type of Home: Skilled Nursing Facility         Home Equipment: Wheelchair - manual      Prior Function Level of Independence: Needs assistance   Gait / Transfers Assistance Needed: per facility report pt mostly been w/c bound as of recent   ADL's / Homemaking Assistance Needed: assist for ADL, pt occasionally can perform self-feeding        Hand Dominance   Dominant Hand: Right    Extremity/Trunk Assessment   Upper Extremity Assessment Upper Extremity Assessment: Defer  to OT evaluation    Lower Extremity Assessment Lower Extremity Assessment: Generalized weakness (actively moving in bed, grossly 3-/5)    Cervical / Trunk Assessment Cervical / Trunk Assessment: Other exceptions Cervical / Trunk Exceptions: slightly kyphotic with rolled in shoulders  Communication   Communication: Expressive difficulties (hx of dysarthria )  Cognition Arousal/Alertness: Awake/alert Behavior During Therapy: WFL for tasks assessed/performed Overall Cognitive Status: History of cognitive impairments - at baseline                                  General Comments: pt with know dementia, pt was able to follow commands consistently, noted delayed processing and sequencing      General Comments General comments (skin integrity, edema, etc.): pt with known 3 areas of breakdown    Exercises     Assessment/Plan    PT Assessment Patient needs continued PT services  PT Problem List Decreased strength;Decreased range of motion;Decreased activity tolerance;Decreased balance;Decreased mobility;Decreased coordination;Decreased cognition       PT Treatment Interventions DME instruction;Gait training;Functional mobility training;Therapeutic activities;Therapeutic exercise;Balance training    PT Goals (Current goals can be found in the Care Plan section)  Acute Rehab PT Goals PT Goal Formulation: With patient/family Time For Goal Achievement: 04/13/20 Potential to Achieve Goals: Good    Frequency Min 3X/week   Barriers to discharge        Co-evaluation PT/OT/SLP Co-Evaluation/Treatment: Yes Reason for Co-Treatment: Complexity of the patient's impairments (multi-system involvement) PT goals addressed during session: Mobility/safety with mobility OT goals addressed during session: ADL's and self-care       AM-PAC PT "6 Clicks" Mobility  Outcome Measure Help needed turning from your back to your side while in a flat bed without using bedrails?: A Lot Help needed moving from lying on your back to sitting on the side of a flat bed without using bedrails?: A Lot Help needed moving to and from a bed to a chair (including a wheelchair)?: A Lot Help needed standing up from a chair using your arms (e.g., wheelchair or bedside chair)?: A Lot Help needed to walk in hospital room?: Total Help needed climbing 3-5 steps with a railing? : Total 6 Click Score: 10    End of Session   Activity Tolerance: Patient tolerated treatment well Patient left: in bed;with call bell/phone within reach;with bed alarm set;with  family/visitor present (in chair position) Nurse Communication: Mobility status PT Visit Diagnosis: Unsteadiness on feet (R26.81);Difficulty in walking, not elsewhere classified (R26.2)    Time: 0254-2706 PT Time Calculation (min) (ACUTE ONLY): 35 min   Charges:   PT Evaluation $PT Eval Moderate Complexity: 1 Mod          Lewis Shock, PT, DPT Acute Rehabilitation Services Pager #: [phone] Office #: [phone]   Iona Hansen 03/23/2020, 2:41 PM

## 2020-03-23 NOTE — Progress Notes (Signed)
Patient transferred to 3W rm 9. Report given to [facility]. All belongings taken-clothes, slippers, personal bed spread. VSS Daughter, [name], made aware.   Blood pressure 136/60, pulse 78, temperature 98.6 F (37 C), temperature source Oral, resp. rate 16, SpO2 99 %.

## 2020-03-23 NOTE — Progress Notes (Signed)
  Speech Language Pathology Treatment: Dysphagia  Patient Details Name: Brenda Riggs MRN: [id number] DOB: [birth year] Today's Date: 03/23/2020 Time: 3754-3606 SLP Time Calculation (min) (ACUTE ONLY): 8 min  Assessment / Plan / Recommendation Clinical Impression  Pt seen for ongoing dysphagia management and PO tolerance.  Pt consumed 4 oz of puree and thin liquid by straw with full SLP assistance for feeding.  There was mild anterior loss of thin liquid x1 of many trials.  Pt has audible swallow, but there were no clinical s/s of aspiration with any PO trials. Pt exhibited good oral clearance of puree.  Pt appears to be tolerated puree solids and thin liquid without difficulties. This diet is consistent with her PO intake prior to admission.  Pt has no further ST needs at this time.  SLP will sign off.   HPI HPI: Brenda Riggs is a 64 y.o. female past medical history of prior strokes with residual dysarthria, diabetes, hypertension, hyperlipidemia, COPD, presented to the emergency room at [facility] for evaluation of a witnessed fall.  She also has a history of dementia from her prior stroke.  She is not able to provide any reliable history.  She was evaluated in the emergency room, had a left forehead laceration that was repaired.  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  No C-spine injury on C-spine CT scan.  Most recent chest xray was showing no active disease.  MRI of  the brain was showing the following:  1 cm acute intraparenchymal hemorrhage centered at the left pons, relatively stable in size and morphology from prior CT. No underlying lesion or significant enhancement seen. Given distribution, a hypertensive bleed is favored.  2. Underlying age-related cerebral atrophy with advanced chronic microvascular ischemic disease and multiple remote lacunar infarcts involving the hemispheric  cerebral white matter, basal ganglia, thalami, and pons. 3. Innumerable superimposed chronic micro hemorrhages clustered      SLP Plan  All goals met;Discharge SLP treatment due to (comment)       Recommendations  Diet recommendations: Dysphagia 1 (puree);Thin liquid Liquids provided via: Straw (single sips only) Medication Administration: Whole meds with puree Supervision: Staff to assist with self feeding Compensations: Slow rate;Small sips/bites Postural Changes and/or Swallow Maneuvers: Seated upright 90 degrees                Oral Care Recommendations: Oral care BID Follow up Recommendations: Skilled Nursing facility SLP Visit Diagnosis: Dysphagia, unspecified (R13.10) Plan: All goals met;Discharge SLP treatment due to (comment)       [place], [place], [place] Office: [phone] 03/23/2020, 10:53 AM

## 2020-03-23 NOTE — Evaluation (Signed)
Occupational Therapy Evaluation Patient Details Name: Brenda Riggs MRN: [id number] DOB: 1955-12-05 Today's Date: 03/23/2020    History of Present Illness Pt is a 64 year old female with a past medical history of strokes, dementia, diabetes, hypertension, hyperlipidemia, COPD that presented to [facility] for evaluation after a fall. Imaging revealed a left pontine ICH with no significant mass-effect as well as a left hyperdensity in the thalamus.    Clinical Impression   This 64 y/o female presents with the above. PTA pt residing at ALF facility, w/c bound and receiving assist for ADL (was performing some self-feeding). Pt presenting with the above and below listed deficits. Today pt tolerating sitting EOB with fluctuating levels of assist (overall min-modA). She requires up to totalA for LB ADL, minA for grooming, self feeding ADL and two person assist for sit<>Stand and taking few side steps along EOB. Pt to benefit from continued acute OT services and currently recommend continued OT services at SNF setting to maximize her overall safety and independence with ADL and mobility.     Follow Up Recommendations  SNF (dtr interested in new facility )    Equipment Recommendations  Other (comment) (defer to next venue)           Precautions / Restrictions Precautions Precautions: Fall Restrictions Weight Bearing Restrictions: No      Mobility Bed Mobility Overal bed mobility: Needs Assistance Bed Mobility: Rolling;Sidelying to Sit;Sit to Supine Rolling: Max assist Sidelying to sit: Max assist;+2 for physical assistance   Sit to supine: Max assist;+2 for physical assistance   General bed mobility comments: pt initiated rolling to the L, had increased difficulty rolling to the R, maxAx2 for LE management off EOB and trunk elevation    Transfers Overall transfer level: Needs assistance Equipment used: 2 person hand held assist Transfers: Sit to/from Stand Sit to Stand: Mod  assist;+2 physical assistance         General transfer comment: pt initiated power up, modAx2 to achieve full upright standing    Balance Overall balance assessment: Needs assistance Sitting-balance support: Feet supported;No upper extremity supported Sitting balance-Leahy Scale: Poor Sitting balance - Comments: pt requiring external assist to maintain midline Postural control: Posterior lean                                 ADL either performed or assessed with clinical judgement   ADL Overall ADL's : Needs assistance/impaired Eating/Feeding: Set up;Supervision/ safety;Sitting Eating/Feeding Details (indicate cue type and reason): cues for taking small bites, pt self-feeding herself lunch end of session Grooming: Minimal assistance;Sitting;Wash/dry face   Upper Body Bathing: Minimal assistance;Sitting   Lower Body Bathing: Maximal assistance;+2 for physical assistance;+2 for safety/equipment;Sitting/lateral leans;Sit to/from stand   Upper Body Dressing : Moderate assistance;Sitting   Lower Body Dressing: Total assistance;+2 for physical assistance;+2 for safety/equipment;Sit to/from stand       Toileting- Architect and Hygiene: Total assistance;+2 for physical assistance;+2 for safety/equipment;Sit to/from stand       Functional mobility during ADLs: Maximal assistance;Moderate assistance;+2 for physical assistance;+2 for safety/equipment                           Pertinent Vitals/Pain Pain Assessment: Faces (reports bain in her buttocks region) Faces Pain Scale: Hurts a little bit Pain Descriptors / Indicators: Discomfort Pain Intervention(s): Monitored during session     Hand Dominance Right   Extremity/Trunk Assessment  Upper Extremity Assessment Upper Extremity Assessment: Generalized weakness   Lower Extremity Assessment Lower Extremity Assessment: Defer to PT evaluation   Cervical / Trunk Assessment Cervical / Trunk  Assessment: Other exceptions Cervical / Trunk Exceptions: slightly kyphotic with rolled in shoulders   Communication Communication Communication: Expressive difficulties (hx of dysarthria )   Cognition Arousal/Alertness: Awake/alert Behavior During Therapy: WFL for tasks assessed/performed Overall Cognitive Status: History of cognitive impairments - at baseline                                 General Comments: pt with know dementia, pt was able to follow commands consistently, noted delayed processing and sequencing   General Comments  pt with known 3 areas of breakdown    Exercises     Shoulder Instructions      Home Living Family/patient expects to be discharged to:: Assisted living     Type of Home: Skilled Nursing Facility                       Home Equipment: Wheelchair - manual          Prior Functioning/Environment Level of Independence: Needs assistance  Gait / Transfers Assistance Needed: per facility report pt mostly been w/c bound as of recent  ADL's / Homemaking Assistance Needed: assist for ADL, pt occasionally can perform self-feeding            OT Problem List: Decreased strength;Decreased range of motion;Decreased activity tolerance;Impaired balance (sitting and/or standing);Decreased cognition;Decreased safety awareness;Pain      OT Treatment/Interventions: Self-care/ADL training;Therapeutic exercise;Energy conservation;DME and/or AE instruction;Therapeutic activities;Cognitive remediation/compensation;Patient/family education;Balance training    OT Goals(Current goals can be found in the care plan section) Acute Rehab OT Goals Patient Stated Goal: none stated, agreeable to working with therapies OT Goal Formulation: With patient Time For Goal Achievement: 04/06/20 Potential to Achieve Goals: Fair  OT Frequency: Min 2X/week   Barriers to D/C:            Co-evaluation PT/OT/SLP Co-Evaluation/Treatment: Yes Reason for  Co-Treatment: Complexity of the patient's impairments (multi-system involvement);For patient/therapist safety;To address functional/ADL transfers PT goals addressed during session: Mobility/safety with mobility        AM-PAC OT "6 Clicks" Daily Activity     Outcome Measure Help from another person eating meals?: A Little Help from another person taking care of personal grooming?: A Lot Help from another person toileting, which includes using toliet, bedpan, or urinal?: Total Help from another person bathing (including washing, rinsing, drying)?: A Lot Help from another person to put on and taking off regular upper body clothing?: A Lot Help from another person to put on and taking off regular lower body clothing?: Total 6 Click Score: 11   End of Session Equipment Utilized During Treatment: Gait belt Nurse Communication: Mobility status  Activity Tolerance: Patient tolerated treatment well Patient left: in bed;with call bell/phone within reach;with bed alarm set;with family/visitor present  OT Visit Diagnosis: Muscle weakness (generalized) (M62.81);Other abnormalities of gait and mobility (R26.89);Other symptoms and signs involving cognitive function                Time: 0865-7846 OT Time Calculation (min): 34 min Charges:  OT General Charges $OT Visit: 1 Visit OT Evaluation $OT Eval Moderate Complexity: 1 Mod  Marcy Siren, OT Acute Rehabilitation Services Pager [phone] Office [phone]    Orlando Penner 03/23/2020, 5:21 PM

## 2020-03-23 NOTE — Progress Notes (Addendum)
STROKE TEAM PROGRESS NOTE     INTERVAL HISTORY She is resting well in bed and has no complaints.  Blood pressure adequately controlled now but required to be started on Cleviprex drip last night..  Vital signs stable.  Neurological exam unchanged   OBJECTIVE Vitals:   03/23/20 1200 03/23/20 1300 03/23/20 1330 03/23/20 1400  BP: (!) 146/67 (!) 163/65 128/62 (!) 121/54  Pulse: 68 72 64 63  Resp: 14 14 10  (!) 7  Temp: 98.3 F (36.8 C)     TempSrc: Oral     SpO2: 100% (!) 88% 96% 95%    CBC:  Recent Labs  Lab 03/21/20 1400  WBC 6.8  HGB 11.4*  HCT 35.1*  MCV 95.6  PLT 337    Basic Metabolic Panel:  Recent Labs  Lab 03/21/20 1400  NA 139  K 3.6  CL 96*  CO2 31  GLUCOSE 91  BUN 18  CREATININE 0.53  CALCIUM 9.4    Lipid Panel:     Component Value Date/Time   CHOL 200 03/21/2020 2320   TRIG 61 03/21/2020 2320   HDL 50 03/21/2020 2320   CHOLHDL 4.0 03/21/2020 2320   VLDL 12 03/21/2020 2320   LDLCALC 138 (H) 03/21/2020 2320   HgbA1c:  Lab Results  Component Value Date   HGBA1C 5.3 03/21/2020   Urine Drug Screen:     Component Value Date/Time   LABOPIA NONE DETECTED 03/28/2017 2315   COCAINSCRNUR NONE DETECTED 03/28/2017 2315   LABBENZ NONE DETECTED 03/28/2017 2315   AMPHETMU NONE DETECTED 03/28/2017 2315   THCU NONE DETECTED 03/28/2017 2315   LABBARB NONE DETECTED 03/28/2017 2315    Alcohol Level     Component Value Date/Time   ETH <10 03/28/2017 2139    IMAGING  CT Head Wo Contrast 03/21/2020 IMPRESSION:  1. New focal intraparenchymal hemorrhage within the pons measuring up to 1.0 cm. Location is suggestive of a hypertensive bleed.  2. Additional 2-3 mm focus of hyperdensity within the left thalamus, concerning for an additional site of intraparenchymal hemorrhage.  3. No evidence of acute traumatic injury to the cervical spine.  4. Moderately advanced chronic microvascular ischemic changes and cerebral volume loss.  5. Heterogeneously  enlarged thyroid gland. Recommend thyroid ultrasound on a nonemergent basis (ref: J Am Coll Radiol. 2015 Feb;12(2): 143-50).   CT Cervical Spine Wo Contrast 03/21/2020 IMPRESSION: No evidence of acute traumatic injury to the cervical spine.       MRI HEAD  IMPRESSION:  1. 1 cm acute intraparenchymal hemorrhage centered at the left pons, relatively stable in size and morphology from prior CT. No underlying lesion or significant enhancement seen. Given distribution, a hypertensive bleed is favored.  2. Underlying age-related cerebral atrophy with advanced chronic microvascular ischemic disease and multiple remote lacunar infarcts involving the hemispheric cerebral white matter, basal ganglia, thalami, and pons.  3. Innumerable superimposed chronic micro hemorrhages clustered about the deep gray nuclei, brainstem, and dentate nuclei, most likely reflecting sequelae of poorly controlled hypertension.   MRA HEAD  IMPRESSION:  1. Negative intracranial MRA for large vessel occlusion. No hemodynamically significant or correctable stenosis.  2. Moderate small vessel atheromatous irregularity throughout the intracranial circulation.   MRA NECK  IMPRESSION:  1. Short-segment 35% atheromatous stenosis at the origin of the right ICA.  2. Otherwise wide patency of both carotid artery systems within the neck.  3. Wide patency of both vertebral arteries within the neck. Left vertebral artery dominant.   Chest Port 1  View 03/21/2020 IMPRESSION:  No active disease.   Transthoracic Echocardiogram  00/00/2021  Left ventricular ejection fraction, by estimation, is 60 to 65%. The  left ventricle has normal function. Left Atrium: Left atrial size was normal in size. IAS/Shunts: No atrial level shunt detected by color flow Doppler.   ECG - SR rate 76 BPM. (See cardiology reading for complete details)  PHYSICAL EXAM Blood pressure (!) 121/54, pulse 63, temperature 98.3 F (36.8 C), temperature source  Oral, resp. rate (!) 7, SpO2 95 %. GENERAL: She is thin somewhat undernourished appearing middle-aged lady and in no acute distress at this time.  HEENT: Neck is supple and no trauma noted.  ABDOMEN: soft  EXTREMITIES: No edema   BACK: Normal  SKIN: Normal by inspection.    MENTAL STATUS: She is awake and cooperates with evaluation.  There is severe paucity of speech although she follows commands.  There is mild dysarthria.  CRANIAL NERVES: Pupils are equal, round and reactive to light and accomodation; extra ocular movements are full, there is no significant nystagmus; visual fields are full; upper and lower facial muscles are normal in strength and symmetric, there is no flattening of the nasolabial folds; tongue is midline; uvula is midline; shoulder elevation is normal.  MOTOR: There is mild drift of the left upper extremity.  This is associated with mild weakness 4 -/5.  There is also 4 -/5 weakness of the left lower extremity.  The right side shows normal strength 5/5 both upper and lower extremities but with increased tone.  COORDINATION: Left finger to nose is normal, right finger to nose is normal, No rest tremor; no intention tremor; no postural tremor; no bradykinesia.  SENSATION: Responds to painful stimuli bilaterally.        MRI is reviewed in person and shows mixed signal lesion involving the left pontine tegmental area on SWI, T1 and DWI.  SWI also shows numerous areas of low signal involving deep gray matter structures bilaterally indicating numerous microhemorrhage.   ASSESSMENT/PLAN Brenda Riggs is a 64 y.o. female with history of prior strokes with residual dysarthria, ongoing tobacco use, diabetes, dementia (unreliable historian), hypertension, hyperlipidemia, COPD, who presented to the emergency room at [facility] for evaluation of a witnessed fall resulting in a left forehead laceration. CT Head - left pontine ICH measuring up to 1 cm along with  2 to 3 mm focus of hyperdensity within the left thalamus. She did not receive IV t-PA due to ICH.  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 Location is suggestive of a hypertensive bleed. Additional 2-3 mm focus of hyperdensity within the left thalamus, concerning for an additional site of intraparenchymal hemorrhage. Moderately advanced chronic microvascular ischemic changes and cerebral volume loss.  MRI head - 1 cm acute intraparenchymal hemorrhage centered at the left pons, relatively stable in size and morphology from prior CT. No underlying lesion or significant enhancement seen. Given distribution, a hypertensive bleed is favored. Underlying age-related cerebral atrophy with advanced chronic microvascular ischemic disease and multiple remote lacunar infarcts involving the hemispheric cerebral white matter, basal ganglia, thalami, and pons. Innumerable superimposed chronic micro hemorrhages clustered about the deep gray nuclei, brainstem, and dentate nuclei, most likely reflecting sequelae of poorly controlled hypertension.   MRA head - Negative intracranial MRA for large vessel occlusion. No hemodynamically significant or correctable  stenosis. Moderate small vessel atheromatous irregularity throughout the intracranial circulation.   MRA Neck - Short-segment 35% atheromatous stenosis at the origin of the right ICA. Otherwise wide patency of both carotid artery systems within the neck. Wide patency of both vertebral arteries within the neck. Left vertebral artery dominant.  CTA H&N - not ordered  CT Perfusion - not ordered        [employer] Virus 2 - negative  LDL - 138  HgbA1c - 5.3  UDS - not ordered  VTE prophylaxis - SCDs  OK TO TRANSFER TO TELEMETRY.  Diet  Diet Order            DIET - DYS 1 Room service appropriate?  Yes with Assist; Fluid consistency: Thin  Diet effective now                 No antithrombotic prior to admission, now on No antithrombotic  Ongoing aggressive stroke risk factor management  Therapy recommendations:  SNF  Disposition:  Pending  Hypertension  Home BP meds: metoprolol  Current BP meds: prn labetalol ; prn Cleviprex  Stable  SBP goal < 140 initially . Long-term BP goal normotensive  Hyperlipidemia  Home Lipid lowering medication: none  LDL 138, goal < 70  Current lipid lowering medication: None (statin contraindicated with ICH)  Continue statin at discharge  Diabetes  Home diabetic meds: metformin  Current diabetic meds: insulin  HgbA1c 5.3, goal < 7.0 Recent Labs    03/22/20 2108 03/23/20 0716 03/23/20 1213  GLUCAP 156* 86 178*    Other Stroke Risk Factors  Advanced age  Cigarette smoker - advised to stop smoking  Obesity, There is no height or weight on file to calculate BMI., recommend weight loss, diet and exercise as appropriate   Hx stroke/TIA  Other Active Problems  Code status - Full code  NS consult - Troy Dawley, DO - 03/21/20 - No neurosurgical intervention recommended   Heterogeneously enlarged thyroid gland. Recommend thyroid ultrasound on a nonemergent basis (ref: J Am Coll Radiol. 2015 Feb;12(2): 143-50).   Forehead laceration  Dementia   Hospital day # 2 Wean off Cleviprex drip with systolic blood pressure goal below 160.  And as needed IV labetalol and hydralazine if needed.  Increase oral blood pressure medications to full home dose.  Mobilize out of bed.  Ongoing therapy consults.  Transfer to neurology floor bed later today when off Cleviprex.  Discussed with patient's daughter over the phone and answered questions about her care. This patient is critically ill and at significant risk of neurological worsening, death and care requires constant monitoring of vital signs, hemodynamics,respiratory and cardiac  monitoring, extensive review of multiple databases, frequent neurological assessment, discussion with family, other specialists and medical decision making of high complexity.I have made any additions or clarifications directly to the above note.This critical care time does not reflect procedure time, or teaching time or supervisory time of PA/NP/Med Resident etc but could involve care discussion time.  I spent 30 minutes of neurocritical care time  in the care of  this patient.    Brenda Heady, MD To contact Stroke Continuity provider, please refer to [email or web address]. After hours, contact General Neurology

## 2020-03-23 NOTE — TOC CAGE-AID Note (Signed)
Transition of Care [facility]) - CAGE-AID Screening   Patient Details  Name: Brenda Riggs MRN: [id number] Date of Birth: [birth year]  [facility]) CM/SW Contact:    Jimmy Picket, [place] Phone Number: 03/23/2020, 4:30 PM   Clinical Narrative:  Pt unable to participate in assessment due to dementia.   CAGE-AID Screening: Substance Abuse Screening unable to be completed due to: : Patient unable to participate            Isabella Stalling Clinical Social Worker [phone]

## 2020-03-24 DIAGNOSIS — I613 Nontraumatic intracerebral hemorrhage in brain stem: Secondary | ICD-10-CM | POA: Diagnosis not present

## 2020-03-24 LAB — GLUCOSE, CAPILLARY
Glucose-Capillary: 82 mg/dL (ref 70–99)
Glucose-Capillary: 133 mg/dL — ABNORMAL HIGH (ref 70–99)
Glucose-Capillary: 175 mg/dL — ABNORMAL HIGH (ref 70–99)
Glucose-Capillary: 171 mg/dL — ABNORMAL HIGH (ref 70–99)

## 2020-03-24 NOTE — NC FL2 (Addendum)
[place] MEDICAID FL2 LEVEL OF CARE SCREENING TOOL     IDENTIFICATION  Patient Name: Brenda Riggs Birthdate: 02-03-1956 Sex: female Admission Date (Current Location): 03/21/2020  [facility] and [place] Number:  Producer, television/film/video and Address:  [facility] [place], [address], [place], [place] [address]      Provider Number: [id number]  Attending Physician Name and Address:  Micki Riley, MD  Relative Name and Phone Number:       Current Level of Care: Hospital Recommended Level of Care: Skilled Nursing Facility Prior Approval Number:    Date Approved/Denied:   PASRR Number: review  Discharge Plan: SNF    Current Diagnoses: Patient Active Problem List   Diagnosis Date Noted   Pressure injury of skin 03/23/2020   ICH (intracerebral hemorrhage) (HCC) 03/21/2020   OSA (obstructive sleep apnea) 07/01/2014   Cigarette smoker 06/28/2014   Wheezing 06/27/2014   MENOPAUSE, SURGICAL 09/09/2009   INSECT BITE 12/19/2008   Essential hypertension, benign 09/26/2008   DIABETES MELLITUS, TYPE II 11/02/2006   PARANOID SCHIZOPHRENIA 11/02/2006   TOBACCO USER 11/02/2006   MENTAL RETARDATION, MILD 11/02/2006   ASTHMA 11/02/2006   HIDRADENITIS SUPPURATIVA 11/02/2006   HYPERLIPIDEMIA 12/21/2004   INCONTINENCE, 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/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   STROKE 10/06/2004   ATAXIA 08/21/2004   SYMPTOM, HYPERSOMNIA W/SLEEP APNEA NOS 11/09/1999   BREAST MASS, LEFT 08/18/1998    Orientation RESPIRATION BLADDER Height & Weight     Self, Place  Normal Incontinent Weight:   Height:     BEHAVIORAL SYMPTOMS/MOOD NEUROLOGICAL BOWEL NUTRITION STATUS      Incontinent Diet (dyspagia 1 with thin liquids)  AMBULATORY STATUS COMMUNICATION OF NEEDS Skin   Total Care Verbally  (stage 2 to coccyx and ishial tuberosity with dressings/ Stage 3 to lt buttock with dressing)                       Personal Care Assistance Level of Assistance  Bathing, Feeding, Dressing Bathing  Assistance: Maximum assistance Feeding assistance: Maximum assistance Dressing Assistance: Maximum assistance     Functional Limitations Info  Sight, Hearing, Speech Sight Info: Adequate Hearing Info: Adequate Speech Info: Adequate    SPECIAL CARE FACTORS FREQUENCY  PT (By licensed PT), OT (By licensed OT)     PT Frequency: 5x/wk OT Frequency: 5x/wk            Contractures Contractures Info: Not present    Additional Factors Info  Code Status, Allergies, Psychotropic, Insulin Sliding Scale Code Status Info: Full Allergies Info: PCN/ Thioridazine Psychotropic Info: Depakote ER 250 mg at Bedtime/ Neurontin 200 mg at bedtime Insulin Sliding Scale Info: Novolog 0-6 units SQ three times a day       Current Medications (03/24/2020):  This is the current hospital active medication list Current Facility-Administered Medications  Medication Dose Route Frequency Provider Last Rate Last Admin   acetaminophen (TYLENOL) tablet 650 mg  650 mg Oral Q4H PRN Fayrene Helper, PA-C   650 mg at 03/23/20 1226   Or   acetaminophen (TYLENOL) 160 MG/5ML solution 650 mg  650 mg Per Tube Q4H PRN Fayrene Helper, PA-C       Or   acetaminophen (TYLENOL) suppository 650 mg  650 mg Rectal Q4H PRN Fayrene Helper, PA-C       amLODipine (NORVASC) tablet 5 mg  5 mg Oral Daily Suzan Garibaldi, PA-C   5 mg at 03/24/20 0929   chlorhexidine (PERIDEX) 0.12 % solution 15  mL  15 mL Mouth Rinse BID Milon Dikes, MD   15 mL at 03/24/20 7824   Chlorhexidine Gluconate Cloth 2 % PADS 6 each  6 each Topical Daily Milon Dikes, MD   6 each at 03/24/20 0930   labetalol (NORMODYNE) injection 20 mg  20 mg Intravenous Once Milon Dikes, MD       And   clevidipine (CLEVIPREX) infusion 0.5 mg/mL  0-21 mg/hr Intravenous Continuous Milon Dikes, MD   Stopped at 03/23/20 1025   collagenase (SANTYL) ointment 1 application  1 application Topical Daily Milon Dikes, MD   1 application at 03/23/20 0909   collagenase (SANTYL)  ointment 1 application  1 application Topical PRN Micki Riley, MD       divalproex (DEPAKOTE ER) 24 hr tablet 250 mg  250 mg Oral QHS Suzan Garibaldi, PA-C   250 mg at 03/24/20 0016   fluPHENAZine (PROLIXIN) tablet 5 mg  5 mg Oral QHS Suzan Garibaldi, PA-C   5 mg at 03/24/20 0016   gabapentin (NEURONTIN) capsule 200 mg  200 mg Oral QHS Suzan Garibaldi, PA-C   200 mg at 03/23/20 2253   insulin aspart (novoLOG) injection 0-6 Units  0-6 Units Subcutaneous TID WC Milon Dikes, MD   1 Units at 03/23/20 1843   labetalol (NORMODYNE) injection 10 mg  10 mg Intravenous Q2H PRN Suzan Garibaldi, PA-C   10 mg at 03/23/20 1133   lidocaine-EPINEPHrine (XYLOCAINE W/EPI) 2 %-1:100000 (with pres) injection 20 mL  20 mL Infiltration Once Fayrene Helper, PA-C       metoprolol tartrate (LOPRESSOR) tablet 50 mg  50 mg Oral BID Beryle Beams, MD   50 mg at 03/24/20 0929   pantoprazole sodium (PROTONIX) 40 mg/20 mL oral suspension 40 mg  40 mg Oral QHS Milon Dikes, MD   40 mg at 03/23/20 2256   senna-docusate (Senokot-S) tablet 1 tablet  1 tablet Oral BID Milon Dikes, MD   1 tablet at 03/24/20 2353     Discharge Medications: Please see discharge summary for a list of discharge medications.  Relevant Imaging Results:  Relevant Lab Results:   Additional Information SS#: [id number]  Kermit Balo, RN   I have personally obtained history,examined this patient, reviewed notes, independently viewed imaging studies, participated in medical decision making and plan of care.ROS completed by me personally and pertinent positives fully documented  I have made any additions or clarifications directly to the above note. Agree with note above.    Delia Heady, MD Medical Director [facility] Pager: [phone] 03/24/2020 2:16 PM

## 2020-03-24 NOTE — TOC Initial Note (Signed)
Transition of Care [facility]) - Initial/Assessment Note    Patient Details  Name: Brenda Riggs MRN: [id number] Date of Birth: 27-Oct-1955  Transition of Care [facility]) CM/SW Contact:    Kermit Balo, RN Phone Number: 03/24/2020, 11:23 AM  Clinical Narrative:                 Pt is from [facility]. Per daughter she wants to find another place for her mother. Current recommendations are for SNF. Pt also with wounds that will need care post d/c. CM spoke to daughter about SNF and she is in agreement . She asked that the pt be faxed out in the [facility] area.  PASAR will need to be received prior to d/c.  TOC following.  Expected Discharge Plan: Skilled Nursing Facility Barriers to Discharge: Continued Medical Work up, SNF Pending bed offer   Patient Goals and CMS Choice   CMS Medicare.gov Compare Post Acute Care list provided to:: Patient Represenative (must comment) Choice offered to / list presented to : Adult Children  Expected Discharge Plan and Services Expected Discharge Plan: Skilled Nursing Facility In-house Referral: Clinical Social Work Discharge Planning Services: CM Consult Post Acute Care Choice: Skilled Nursing Facility Living arrangements for the past 2 months: Assisted Living Facility                                      Prior Living Arrangements/Services Living arrangements for the past 2 months: Assisted Living Facility Lives with:: Facility Resident   Do you feel safe going back to the place where you live?: No               Activities of Daily Living Home Assistive Devices/Equipment: Wheelchair ADL Screening (condition at time of admission) Patient's cognitive ability adequate to safely complete daily activities?: Yes Is the patient deaf or have difficulty hearing?: No Does the patient have difficulty seeing, even when wearing glasses/contacts?: No Does the patient have difficulty concentrating, remembering, or making decisions?:  No Patient able to express need for assistance with ADLs?: Yes Does the patient have difficulty dressing or bathing?: Yes Independently performs ADLs?: No Communication: Independent Dressing (OT): Dependent Is this a change from baseline?: Pre-admission baseline Grooming: Dependent Is this a change from baseline?: Pre-admission baseline Feeding: Dependent Is this a change from baseline?: Pre-admission baseline Bathing: Dependent Is this a change from baseline?: Pre-admission baseline Toileting: Dependent Is this a change from baseline?: Pre-admission baseline In/Out Bed: Dependent Is this a change from baseline?: Pre-admission baseline Walks in Home: Dependent Is this a change from baseline?: Pre-admission baseline Does the patient have difficulty walking or climbing stairs?: Yes Weakness of Legs: Both Weakness of Arms/Hands: None  Permission Sought/Granted                  Emotional Assessment Appearance:: Appears stated age         Psych Involvement: No (comment)  Admission diagnosis:  ICH (intracerebral hemorrhage) (HCC) [I61.9] Face lacerations, initial encounter [S01.81XA] Traumatic hemorrhage of cerebrum without loss of consciousness, unspecified laterality, initial encounter (HCC) [S06.360A] Patient Active Problem List   Diagnosis Date Noted  . Pressure injury of skin 03/23/2020  . ICH (intracerebral hemorrhage) (HCC) 03/21/2020  . OSA (obstructive sleep apnea) 07/01/2014  . Cigarette smoker 06/28/2014  . Wheezing 06/27/2014  . MENOPAUSE, SURGICAL 09/09/2009  . INSECT BITE 12/19/2008  . Essential hypertension, benign 09/26/2008  .  DIABETES MELLITUS, TYPE II 11/02/2006  . 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 TOBACCO USER 11/02/2006  . MENTAL RETARDATION, MILD 11/02/2006  . ASTHMA 11/02/2006  . HIDRADENITIS SUPPURATIVA 11/02/2006  . HYPERLIPIDEMIA 12/21/2004  . 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 STROKE 10/06/2004  . ATAXIA 08/21/2004  .  SYMPTOM, HYPERSOMNIA W/SLEEP APNEA NOS 11/09/1999  . BREAST MASS, LEFT 08/18/1998   PCP:  [name], MD Pharmacy:  No Pharmacies Listed    Social Determinants of Health (SDOH) Interventions    Readmission Risk Interventions No flowsheet data found.

## 2020-03-24 NOTE — Progress Notes (Signed)
STROKE TEAM PROGRESS NOTE     INTERVAL HISTORY She is now transferred to the neurology floor bed.  She is remained stable.  Vital signs stable.  Neurological exam unchanged.  Therapy evaluation recommends skilled nursing facility for rehabilitation.   OBJECTIVE Vitals:   03/23/20 2337 03/24/20 0325 03/24/20 0759 03/24/20 1158  BP: 129/61 122/66 (!) 168/63 (!) 165/78  Pulse: 79 80 71 72  Resp: 18 16 18 16   Temp: 98.2 F (36.8 C) 98.1 F (36.7 C) 98.4 F (36.9 C) 99.3 F (37.4 C)  TempSrc: Oral Oral Oral Oral  SpO2: 98% 98% 100% 99%    CBC:  Recent Labs  Lab 03/21/20 1400  WBC 6.8  HGB 11.4*  HCT 35.1*  MCV 95.6  PLT 337    Basic Metabolic Panel:  Recent Labs  Lab 03/21/20 1400  NA 139  K 3.6  CL 96*  CO2 31  GLUCOSE 91  BUN 18  CREATININE 0.53  CALCIUM 9.4    Lipid Panel:     Component Value Date/Time   CHOL 200 03/21/2020 2320   TRIG 61 03/21/2020 2320   HDL 50 03/21/2020 2320   CHOLHDL 4.0 03/21/2020 2320   VLDL 12 03/21/2020 2320   LDLCALC 138 (H) 03/21/2020 2320   HgbA1c:  Lab Results  Component Value Date   HGBA1C 5.3 03/21/2020   Urine Drug Screen:     Component Value Date/Time   LABOPIA NONE DETECTED 03/28/2017 2315   COCAINSCRNUR NONE DETECTED 03/28/2017 2315   LABBENZ NONE DETECTED 03/28/2017 2315   AMPHETMU NONE DETECTED 03/28/2017 2315   THCU NONE DETECTED 03/28/2017 2315   LABBARB NONE DETECTED 03/28/2017 2315    Alcohol Level     Component Value Date/Time   ETH <10 03/28/2017 2139    IMAGING  CT Head Wo Contrast 03/21/2020 IMPRESSION:  1. New focal intraparenchymal hemorrhage within the pons measuring up to 1.0 cm. Location is suggestive of a hypertensive bleed.  2. Additional 2-3 mm focus of hyperdensity within the left thalamus, concerning for an additional site of intraparenchymal hemorrhage.  3. No evidence of acute traumatic injury to the cervical spine.  4. Moderately advanced chronic microvascular ischemic  changes and cerebral volume loss.  5. Heterogeneously enlarged thyroid gland. Recommend thyroid ultrasound on a nonemergent basis (ref: J Am Coll Radiol. 2015 Feb;12(2): 143-50).   CT Cervical Spine Wo Contrast 03/21/2020 IMPRESSION: No evidence of acute traumatic injury to the cervical spine.       MRI HEAD  IMPRESSION:  1. 1 cm acute intraparenchymal hemorrhage centered at the left pons, relatively stable in size and morphology from prior CT. No underlying lesion or significant enhancement seen. Given distribution, a hypertensive bleed is favored.  2. Underlying age-related cerebral atrophy with advanced chronic microvascular ischemic disease and multiple remote lacunar infarcts involving the hemispheric cerebral white matter, basal ganglia, thalami, and pons.  3. Innumerable superimposed chronic micro hemorrhages clustered about the deep gray nuclei, brainstem, and dentate nuclei, most likely reflecting sequelae of poorly controlled hypertension.   MRA HEAD  IMPRESSION:  1. Negative intracranial MRA for large vessel occlusion. No hemodynamically significant or correctable stenosis.  2. Moderate small vessel atheromatous irregularity throughout the intracranial circulation.   MRA NECK  IMPRESSION:  1. Short-segment 35% atheromatous stenosis at the origin of the right ICA.  2. Otherwise wide patency of both carotid artery systems within the neck.  3. Wide patency of both vertebral arteries within the neck. Left vertebral artery dominant.  Chest Port 1 View 03/21/2020 IMPRESSION:  No active disease.   Transthoracic Echocardiogram  00/00/2021  Left ventricular ejection fraction, by estimation, is 60 to 65%. The  left ventricle has normal function. Left Atrium: Left atrial size was normal in size. IAS/Shunts: No atrial level shunt detected by color flow Doppler.   ECG - SR rate 76 BPM. (See cardiology reading for complete details)  PHYSICAL EXAM Blood pressure (!) 165/78,  pulse 72, temperature 99.3 F (37.4 C), temperature source Oral, resp. rate 16, SpO2 99 %. GENERAL: She is thin somewhat undernourished appearing middle-aged lady and in no acute distress at this time.  HEENT: Neck is supple and no trauma noted.  ABDOMEN: soft  EXTREMITIES: No edema   BACK: Normal  SKIN: Normal by inspection.    MENTAL STATUS: She is awake and cooperates with exam there is severe paucity of speech although she follows commands.  There is mild dysarthria.  CRANIAL NERVES: Pupils are equal, round and reactive to light and accomodation; extra ocular movements are full, there is no significant nystagmus; visual fields are full; upper and lower facial muscles are normal in strength and symmetric, there is no flattening of the nasolabial folds; tongue is midline; uvula is midline; shoulder elevation is normal.  MOTOR: There is mild drift of the left upper extremity.  This is associated with mild weakness 4 -/5.  There is also 4 -/5 weakness of the left lower extremity.  The right side shows normal strength 5/5 both upper and lower extremities but with increased tone.  COORDINATION: Left finger to nose is normal, right finger to nose is normal, No rest tremor; no intention tremor; no postural tremor; no bradykinesia.  SENSATION: Responds to painful stimuli bilaterally.        ASSESSMENT/PLAN Brenda Riggs is a 64 y.o. female with history of prior strokes with residual dysarthria, ongoing tobacco use, diabetes, dementia (unreliable historian), hypertension, hyperlipidemia, COPD, who presented to the emergency room at [facility] for evaluation of a witnessed fall resulting in a left forehead laceration. 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 She did not receive IV t-PA due to ICH.  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 Location is suggestive of a hypertensive bleed. Additional 2-3 mm focus of hyperdensity within the left thalamus, concerning for an additional site of intraparenchymal hemorrhage. Moderately advanced chronic microvascular ischemic changes and cerebral volume loss.  MRI head - 1 cm acute intraparenchymal hemorrhage centered at the left pons, relatively stable in size and morphology from prior CT. No underlying lesion or significant enhancement seen. Given distribution, a hypertensive bleed is favored. Underlying age-related cerebral atrophy with advanced chronic microvascular ischemic disease and multiple remote lacunar infarcts involving the hemispheric cerebral white matter, basal ganglia, thalami, and pons. Innumerable superimposed chronic micro hemorrhages clustered about the deep gray nuclei, brainstem, and dentate nuclei, most likely reflecting sequelae of poorly controlled hypertension.   MRA head - Negative intracranial MRA for large vessel occlusion. No hemodynamically significant or correctable stenosis. Moderate small vessel atheromatous irregularity throughout the intracranial circulation.   MRA Neck - Short-segment 35% atheromatous stenosis at the origin of the right ICA. Otherwise wide patency of both carotid artery systems within the neck. Wide patency of  both vertebral arteries within the neck. Left vertebral artery dominant.  CTA H&N - not ordered  CT Perfusion - not ordered        [employer] Virus 2 - negative  LDL - 138  HgbA1c - 5.3  UDS - not ordered  VTE prophylaxis - SCDs  OK TO TRANSFER TO TELEMETRY.  Diet  Diet Order            DIET - DYS 1 Room service appropriate? Yes with Assist; Fluid consistency: Thin  Diet effective now                 No antithrombotic prior to admission, now on No antithrombotic  Ongoing aggressive stroke risk factor  management  Therapy recommendations:  SNF  Disposition:  Pending  Hypertension  Home BP meds: metoprolol  Current BP meds: prn labetalol ; prn Cleviprex  Stable  SBP goal < 140 initially . Long-term BP goal normotensive  Hyperlipidemia  Home Lipid lowering medication: none  LDL 138, goal < 70  Current lipid lowering medication: None (statin contraindicated with ICH)  Continue statin at discharge  Diabetes  Home diabetic meds: metformin  Current diabetic meds: insulin  HgbA1c 5.3, goal < 7.0 Recent Labs    03/23/20 2102 03/24/20 0612 03/24/20 1154  GLUCAP 88 82 133*    Other Stroke Risk Factors  Advanced age  Cigarette smoker - advised to stop smoking  Obesity, There is no height or weight on file to calculate BMI., recommend weight loss, diet and exercise as appropriate   Hx stroke/TIA  Other Active Problems  Code status - Full code  NS consult - Troy Dawley, DO - 03/21/20 - No neurosurgical intervention recommended   Heterogeneously enlarged thyroid gland. Recommend thyroid ultrasound on a nonemergent basis (ref: J Am Coll Radiol. 2015 Feb;12(2): 143-50).   Forehead laceration  Dementia   Hospital day # 3 Continue on continue ongoing blood pressure management.  Therapy consults.  Likely transfer to skilled nursing facility in the next few days when bed available.  Discussed with case Production designer, theatre/television/film.  Greater than 50% time during this 25-minute visit was spent on counseling and coordination of care and discussion with care team.   Delia Heady, MD To contact Stroke Continuity provider, please refer to [email or web address]. After hours, contact General Neurology

## 2020-03-24 NOTE — Progress Notes (Signed)
Re: Brenda Riggs DOB: 07/19/1955 Date: 03/24/2020   To Whom It May Concern:  Please be advised that the above-named patient will require a short-term nursing home stay--anticipated 30 days or less for rehabilitation and strengthening. The plan is for home.

## 2020-03-24 NOTE — Plan of Care (Signed)
  Problem: Clinical Measurements: Goal: Cardiovascular complication will be avoided Outcome: Progressing   Problem: Clinical Measurements: Goal: Respiratory complications will improve Outcome: Progressing   Problem: Clinical Measurements: Goal: Ability to maintain clinical measurements within normal limits will improve Outcome: Progressing   Problem: Health Behavior/Discharge Planning: Goal: Ability to manage health-related needs will improve Outcome: Progressing

## 2020-03-25 DIAGNOSIS — S0181XA Laceration without foreign body of other part of head, initial encounter: Secondary | ICD-10-CM

## 2020-03-25 DIAGNOSIS — E049 Nontoxic goiter, unspecified: Secondary | ICD-10-CM

## 2020-03-25 DIAGNOSIS — I613 Nontraumatic intracerebral hemorrhage in brain stem: Secondary | ICD-10-CM | POA: Diagnosis not present

## 2020-03-25 LAB — RESPIRATORY PANEL BY RT PCR (FLU A&B, COVID)
Influenza A by PCR: NEGATIVE
Influenza B by PCR: NEGATIVE
SARS Coronavirus 2 by RT PCR: NEGATIVE

## 2020-03-25 LAB — GLUCOSE, CAPILLARY
Glucose-Capillary: 96 mg/dL (ref 70–99)
Glucose-Capillary: 85 mg/dL (ref 70–99)
Glucose-Capillary: 205 mg/dL — ABNORMAL HIGH (ref 70–99)

## 2020-03-25 MED ORDER — ATORVASTATIN CALCIUM 40 MG PO TABS: 40.0000 mg | ORAL_TABLET | Freq: Every day | ORAL | Status: DC

## 2020-03-25 MED ORDER — ATORVASTATIN CALCIUM 40 MG PO TABS
40.0000 mg | ORAL_TABLET | Freq: Every day | ORAL | Status: DC
  Filled 2020-03-25: qty 1

## 2020-03-25 MED ORDER — AMLODIPINE BESYLATE 5 MG PO TABS: 5.0000 mg | ORAL_TABLET | Freq: Every day | ORAL | Status: AC

## 2020-03-25 MED ORDER — COLLAGENASE 250 UNIT/GM EX OINT: 1.0000 "application " | TOPICAL_OINTMENT | Freq: Every day | CUTANEOUS | 0 refills | Status: AC

## 2020-03-25 MED ORDER — METOPROLOL TARTRATE 50 MG PO TABS: 50.0000 mg | ORAL_TABLET | Freq: Two times a day (BID) | ORAL | Status: AC

## 2020-03-25 NOTE — Discharge Summary (Addendum)
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 Unknown reaction Did it involve swelling of the face/tongue/throat, SOB, or low BP? Unknown Did it involve sudden or severe rash/hives, skin peeling, or any reaction on the inside of your mouth or nose? Unknown Did you need to seek medical attention at a hospital or doctor's office? Unknown When did it last happen?    unknown   If all above answers are "NO", may proceed with cephalosporin use.   Thioridazine Hcl Other (See Comments)   Present in Epic; not noted on MAR        Medication List     STOP taking these medications    naproxen 500 MG tablet Commonly known as: NAPROSYN   neomycin-bacitracin-polymyxin [phone] ointment       TAKE these medications    acetaminophen 325 MG tablet Commonly known as: TYLENOL Take 650 mg by mouth every 6 (six) hours as needed for headache (minor discomfort/fever up to 100 degrees orally).   albuterol 108 (90 Base) MCG/ACT inhaler Commonly known as: VENTOLIN HFA Inhale 2 puffs into the lungs 4 (four) times daily as needed for wheezing or shortness of breath.   amLODipine 5 MG tablet Commonly known as: NORVASC Take 1 tablet (5 mg total) by mouth daily. Start taking on:  March 26, 2020   Antacid 200-200-20 MG/5ML suspension Generic drug: alum & mag hydroxide-simeth Take 30 mLs by mouth once as needed for indigestion or heartburn.   atorvastatin 40 MG tablet Commonly known as: LIPITOR Take 1 tablet (40 mg total) by mouth daily at 6 PM.   cholecalciferol 25 MCG (1000 UNIT) tablet Commonly known as: VITAMIN D3 Take 1,000 Units by mouth daily.   collagenase ointment Commonly known as: SANTYL Apply 1 application topically daily for 21 days. L ischial tuberosity daily and PRN soiling   diclofenac Sodium 1 % Gel Commonly known as: VOLTAREN Apply 2 g topically 2 (two) times daily. Apply to left shoulder   dimenhyDRINATE 50 MG tablet Commonly known as: DRAMAMINE Take 50 mg by mouth every 4 (four) hours as needed (nausea/vomiting).   divalproex 250 MG 24 hr tablet Commonly known as: DEPAKOTE ER Take 250 mg by mouth at bedtime.   FLEET ENEMA RE Place 1 enema rectally daily as needed (constipation not relieved by MOM and prune juice).   fluPHENAZine 5 MG tablet Commonly known as: PROLIXIN Take 5 mg by mouth at bedtime.   fluticasone 50 MCG/ACT nasal spray Commonly known as: FLONASE Place 1 spray into both nostrils daily.   gabapentin 100 MG capsule Commonly known as: NEURONTIN Take 200 mg by mouth at bedtime.   liver oil-zinc oxide 40 % ointment Commonly known as: DESITIN Apply 1 application  topically as needed for irritation. Apply to buttocks   loperamide 2 MG tablet Commonly known as: IMODIUM A-D Take 2 mg by mouth 4 (four) times daily as needed for diarrhea or loose stools.   metFORMIN 500 MG tablet Commonly known as: GLUCOPHAGE Take 500 mg by mouth every 12 (twelve) hours.   metoprolol tartrate 50 MG tablet Commonly known as: LOPRESSOR Take 1 tablet (50 mg total) by mouth 2 (two) times daily. What changed: when to take this   Milk of Magnesia 400 MG/5ML suspension Generic drug: magnesium hydroxide Take 22.5 mLs by mouth  every 8 (eight) hours as needed (constipation (if no relief in 8 hours give with 6 oz prune juice)).   NUTRITIONAL SUPPLEMENT PO Take 1 each by mouth 3 (three) times daily with meals.   Qvar RediHaler 40 MCG/ACT inhaler Generic drug: beclomethasone Inhale 2 puffs into the lungs 2 (two) times daily.   Siltussin SA 100 MG/5ML syrup Generic drug: guaifenesin Take 200 mg by mouth every 6 (six) hours as needed for cough.               Discharge Care Instructions  (From admission, onward)           Start     Ordered   03/25/20 0000  Discharge wound care:       Comments: Wound care to Stage 2 pressure injury (POA) at coccygeal area and Stage 2 pressure injury at right buttock: Cleanse with NS pat dry. Cover with size appropriate piece of xeroform gauze [employer] [place] # [id number]), top with dry gauze 2x2 and cover with a silicone foam dressing for the sacrum. Orient the "tip" of the sacral foam away from the rectum to allow for maximum coverage of the pressure area. Change xeroform daily and sacral foam every 2-3 days and as needed. Turn side to side and minimize time in the supine position.  Wound care to Stage 3 pressure injury (POA) to left ischial tuberosity:  Cleanse with NS, pat dry. Apply a thin layer of collagenase (Santyl) to the wound, top with saline moistened gauze (opened), top with dry gauze. Cover with silicone foam dressing. Attempt to keep HOB at or below a 30 degree angle as tolerated.   03/25/20 1249            LABORATORY STUDIES CBC    Component Value Date/Time   WBC 6.8 03/21/2020 1400   RBC 3.67 (L) 03/21/2020 1400   HGB 11.4 (L) 03/21/2020 1400   HCT 35.1 (L) 03/21/2020 1400   PLT 337 03/21/2020 1400   MCV 95.6 03/21/2020 1400   MCH 31.1 03/21/2020 1400   MCHC 32.5 03/21/2020 1400   RDW 14.0 03/21/2020 1400   LYMPHSABS 2.1 07/08/2019 1947   MONOABS 0.6 07/08/2019 1947   EOSABS 0.1 07/08/2019 1947   BASOSABS 0.0 07/08/2019 1947   CMP    Component Value  Date/Time   NA 139 03/21/2020 1400   K 3.6 03/21/2020 1400   CL 96 (L) 03/21/2020 1400   CO2 31 03/21/2020 1400   GLUCOSE 91 03/21/2020 1400   BUN 18 03/21/2020 1400   CREATININE 0.53 03/21/2020 1400   CALCIUM 9.4 03/21/2020 1400   PROT 7.6 03/21/2020 1400   ALBUMIN 4.1 03/21/2020 1400   AST 21 03/21/2020 1400   ALT 10 03/21/2020 1400   ALKPHOS 57 03/21/2020 1400   BILITOT 0.5 03/21/2020 1400   GFRNONAA >60 03/21/2020 1400   GFRAA >60 07/08/2019 1947   COAGS Lab Results  Component  Value Date   INR 1.0 03/21/2020   INR 1.01 03/28/2017   Lipid Panel    Component Value Date/Time   CHOL 200 03/21/2020 2320   TRIG 61 03/21/2020 2320   HDL 50 03/21/2020 2320   CHOLHDL 4.0 03/21/2020 2320   VLDL 12 03/21/2020 2320   LDLCALC 138 (H) 03/21/2020 2320   HgbA1C  Lab Results  Component Value Date   HGBA1C 5.3 03/21/2020   Urinalysis    Component Value Date/Time   COLORURINE YELLOW 03/22/2020 1006   APPEARANCEUR CLEAR 03/22/2020 1006   LABSPEC 1.010 03/22/2020 1006   PHURINE 7.0 03/22/2020 1006   GLUCOSEU NEGATIVE 03/22/2020 1006   HGBUR NEGATIVE 03/22/2020 1006   HGBUR trace-intact 09/10/2009 1115   BILIRUBINUR NEGATIVE 03/22/2020 1006   KETONESUR NEGATIVE 03/22/2020 1006   PROTEINUR NEGATIVE 03/22/2020 1006   UROBILINOGEN 0.2 10/22/2009 2021   NITRITE NEGATIVE 03/22/2020 1006   LEUKOCYTESUR NEGATIVE 03/22/2020 1006    SIGNIFICANT DIAGNOSTIC STUDIES CT Head Wo Contrast 03/21/2020 1. New focal intraparenchymal hemorrhage within the pons measuring up to 1.0 cm. Location is suggestive of a hypertensive bleed.  2. Additional 2-3 mm focus of hyperdensity within the left thalamus, concerning for an additional site of intraparenchymal hemorrhage.  3. No evidence of acute traumatic injury to the cervical spine.  4. Moderately advanced chronic microvascular ischemic changes and cerebral volume loss.  5. Heterogeneously enlarged thyroid gland. Recommend thyroid ultrasound on  a nonemergent basis (ref: J Am Coll Radiol. 2015 Feb;12(2): 143-50).    CT Cervical Spine Wo Contrast 03/21/2020 No evidence of acute traumatic injury to the cervical spine.    MRI HEAD  03/21/2020 1. 1 cm acute intraparenchymal hemorrhage centered at the left pons, relatively stable in size and morphology from prior CT. No underlying lesion or significant enhancement seen. Given distribution, a hypertensive bleed is favored.  2. Underlying age-related cerebral atrophy with advanced chronic microvascular ischemic disease and multiple remote lacunar infarcts involving the hemispheric cerebral white matter, basal ganglia, thalami, and pons.  3. Innumerable superimposed chronic micro hemorrhages clustered about the deep gray nuclei, brainstem, and dentate nuclei, most likely reflecting sequelae of poorly controlled hypertension.    MRA HEAD  03/21/2020 1. Negative intracranial MRA for large vessel occlusion. No hemodynamically significant or correctable stenosis.  2. Moderate small vessel atheromatous irregularity throughout the intracranial circulation.    MRA NECK  03/21/2020 1. Short-segment 35% atheromatous stenosis at the origin of the right ICA.  2. Otherwise wide patency of both carotid artery systems within the neck.  3. Wide patency of both vertebral arteries within the neck. Left vertebral artery dominant.    Chest Port 1 View 03/21/2020 No active disease.    Transthoracic Echocardiogram  03/22/2020 Left ventricular ejection fraction, by estimation, is 60 to 65%. The left ventricle has normal function. Left Atrium: Left atrial size was normal in size. IAS/Shunts: No atrial level shunt detected by color flow Doppler.    ECG - SR rate 76 BPM. (See cardiology reading for complete details)     HISTORY OF PRESENT ILLNESS Brenda Riggs is a 64 y.o. female past medical history of prior strokes with residual dysarthria, diabetes, hypertension, hyperlipidemia, COPD, presented to the  emergency room at [facility] for evaluation of a witnessed fall.  She also has a history of dementia from her prior stroke.  She is not able to provide any reliable history. She was evaluated in the emergency room, had a left forehead laceration that was repaired.  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  No C-spine injury on C-spine CT scan. [facility] evaluation and admission. Patient has significant dysarthria on examination.  No family at bedside. No neurological notes in the past in our electronic medical record or Care Everywhere. Denies headaches.  Denies chest pain.  Denies shortness of breath.  Denies fevers or chills. Denies having had loss consciousness but she is an unreliable historian. Her LKW was Unclear. Premorbid modified Rankin scale (mRS): Unable to ascertain. ICH score-1 for infratentorial origin  HOSPITAL COURSE Ms. Rhett BannisterGloria J Alridge is a 64 y.o. female with history of prior strokes with residual dysarthria, ongoing tobacco use, diabetes, dementia (unreliable historian), hypertension, hyperlipidemia, COPD, who [facility] for evaluation of a witnessed fall resulting in a left forehead laceration. 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 She did not receive IV t-PA due to ICH.   Stroke:  Left pontine hemorrhage likely of hypertensive etiology Resultant dysarthria and left hemiparesis NS consult - Troy Dawley, DO - 03/21/20 - No neurosurgical intervention recommended  CT head -  New focal intraparenchymal hemorrhage within the pons measuring up to 1.0 cm. Location is suggestive of a hypertensive bleed. Additional 2-3 mm focus of hyperdensity within the left thalamus, concerning for an additional site of  intraparenchymal hemorrhage. Moderately advanced chronic microvascular ischemic changes and cerebral volume loss. MRI head - 1 cm acute intraparenchymal hemorrhage centered at the left pons, relatively stable in size and morphology from prior CT. No underlying lesion or significant enhancement seen. Given distribution, a hypertensive bleed is favored. Underlying age-related cerebral atrophy with advanced chronic microvascular ischemic disease and multiple remote lacunar infarcts involving the hemispheric cerebral white matter, basal ganglia, thalami, and pons. Innumerable superimposed chronic micro hemorrhages clustered about the deep gray nuclei, brainstem, and dentate nuclei, most likely reflecting sequelae of poorly controlled hypertension.  MRA head - Negative intracranial MRA for large vessel occlusion. No hemodynamically significant or correctable stenosis. Moderate small vessel atheromatous irregularity throughout the intracranial circulation.  MRA Neck - Short-segment 35% atheromatous stenosis at the origin of the right ICA. Otherwise wide patency of both carotid artery systems within the neck. Wide patency of both vertebral arteries within the neck. Left vertebral artery dominant. Sars Corona Virus 2 - negative LDL - 138 HgbA1c - 5.3 No antithrombotic prior to admission, now on No antithrombotic Therapy recommendations:  SNF Disposition:  Short-term SNF   Hypertension Home BP meds: metoprolol 50 daily SBP goal < 140 initially, now < 160 Now on norvasc 5, metoprolol 50 bid Stable Long-term BP goal normotensive   Hyperlipidemia Home Lipid lowering medication: none LDL 138, goal < 70 Current lipid lowering medication: None (statin contraindicated with acute ICH) Add statin at discharge   Diabetes type II Home diabetic meds: metformin Current diabetic meds: SSI  HgbA1c 5.3, goal < 7.0 Resume metformin at d/c  Other Stroke Risk Factors Advanced age Cigarette smoker - advised to  stop smoking Hx stroke/TIA 06/2017 - MRI at [facility] - Multiple old lacunes and microhemorrhages. No acute abnormality.  obstructive sleep apnea   Other Active Problems Baseline Dementia Heterogeneously enlarged thyroid gland. Recommend thyroid ultrasound on a nonemergent basis as an OP  Forehead laceration Stage 2 pressure injury on coccyx, stage 2 on R buttocks, stage 3 L ischial tuberosity - all present on admission, WOC consulted. Dressing info in meds On depakote and neurontin PTA    DISCHARGE EXAM Blood pressure (!) 167/53, pulse 63, temperature 97.8 F (36.6 C), temperature source Oral, resp. rate 16, SpO2 100 %. GENERAL: She is thin somewhat undernourished appearing middle-aged lady and in no acute distress at this time.   HEENT: Neck is supple and no trauma noted.   ABDOMEN: soft   EXTREMITIES: No edema    BACK: Normal   SKIN: Normal by inspection.    MENTAL STATUS: She is awake and cooperates with exam there is severe paucity of speech although she follows commands.  There is mild dysarthria.   CRANIAL NERVES: Pupils are equal, round and reactive to light and accomodation; extra ocular movements are full, there is no significant nystagmus; visual fields are full; upper and lower facial muscles are normal in strength and symmetric, there is no flattening of the nasolabial folds; tongue is midline; uvula is midline; shoulder elevation is normal.   MOTOR: There is mild drift of the left upper extremity.  This is associated with mild weakness 4 -/5.  There is also 4 -/5 weakness of the left lower extremity.  The right side shows normal strength 5/5 both upper and lower extremities but with increased tone.   COORDINATION: Left finger to nose is normal, right finger to nose is normal, No rest tremor; no intention tremor; no postural tremor; no bradykinesia.   SENSATION: Responds to painful stimuli bilaterally.   Discharge Diet   Dysphagia 1 thin liquids  DISCHARGE  PLAN Disposition:  Skilled nursing facility for ongoing PT, OT and ST - short stay anticipated Due to hemorrhage and risk of bleeding, do not take aspirin, aspirin-containing medications, or ibuprofen products  Ongoing stroke risk factor control by Primary Care Physician at time of discharge OP thyroid US recommended to look at enlargement  Follow-up PCP [name], MD in 2 weeks or MD at [facility]. Follow-up in [facility] in 4 - 6 weeks, office to schedule an appointment.   40 minutes were spent preparing discharge.  Annie Main, MSN, APRN, ANVP-BC, AGPCNP-BC Advanced Practice Stroke Nurse [facility] See Amion for Schedule & Pager information 03/25/2020 12:49 PM   I have personally obtained history,examined this patient, reviewed notes, independently viewed imaging studies, participated in medical decision making and plan of care.ROS completed by me personally and pertinent positives fully documented  I have made any additions or clarifications directly to the above note. Agree with note above.    Delia Heady, MD Medical Director [facility] Pager: [phone] 03/25/2020 1:51 PM

## 2020-03-25 NOTE — Progress Notes (Signed)
Report called to [facility] at [facility]. Discharge paperwork placed in packet for receiving facility. Tele and IV removed. Clothing returned to patient. Awaiting PTAR.

## 2020-03-25 NOTE — Progress Notes (Signed)
Physical Therapy Treatment Patient Details Name: Brenda Riggs MRN: [id number] DOB: 28-Oct-1955 Today's Date: 03/25/2020    History of Present Illness Pt is a 64 year old female with a past medical history of strokes, dementia, diabetes, hypertension, hyperlipidemia, COPD that presented to [facility] for evaluation after a fall. Imaging revealed a left pontine ICH with no significant mass-effect as well as a left hyperdensity in the thalamus.     PT Comments    Pt is making progress towards her goals as she is requiring less assistance for functional mobility. She only required modAx1-2 (with assistance from rehab tech) for bed mobility this date. She also progressed from requiring maxAx1 to come to stand the first rep this date to only requiring modAx1 with subsequent reps. She demonstrates leg muscular weakness and fatigue as time in standing progresses through flexing at her trunk, but she was able to stand for several minutes per bout. She required tactile cues to facilitate gluteus maximus and quads contraction to maintain her standing balance with modAx1. Will continue to follow acutely. Current recommendations remain appropriate.   Follow Up Recommendations  SNF;Supervision/Assistance - 24 hour (daughter wants another facility)     Equipment Recommendations  None recommended by PT (pt in w/c at facility)    Recommendations for Other Services       Precautions / Restrictions Precautions Precautions: Fall Restrictions Weight Bearing Restrictions: No    Mobility  Bed Mobility Overal bed mobility: Needs Assistance Bed Mobility: Rolling;Sidelying to Sit;Sit to Supine Rolling: Mod assist Sidelying to sit: Mod assist;+2 for physical assistance   Sit to supine: Mod assist;+2 for physical assistance   General bed mobility comments: Cues for hand placement on bed rails and to push through feet on bed to roll, increased time to follow cues and reach hand to rail. ModA to complete  roll and direct hand. ModAx2 to manage trunk and legs with coming to sit and returning to supine, cuing pt to manage legs and utilize elbow to ascend/descend trunk.  Transfers Overall transfer level: Needs assistance Equipment used: 2 person hand held assist Transfers: Sit to/from Stand Sit to Stand: Max assist         General transfer comment: Sit to stand 3x from EOB with bilat knee block and hands on therapist. MaxA first bout but modA final 2 reps as pt demonstrated improved initiation. Cued pt to lean anteriorly and extend hips and knees.  Ambulation/Gait             General Gait Details: Did not ambulate as pt returned to bed for nursing to change dressings   Stairs             Wheelchair Mobility    Modified Rankin (Stroke Patients Only) Modified Rankin (Stroke Patients Only) Pre-Morbid Rankin Score: Moderately severe disability Modified Rankin: Moderately severe disability     Balance Overall balance assessment: Needs assistance Sitting-balance support: Bilateral upper extremity supported;Feet supported Sitting balance-Leahy Scale: Poor Sitting balance - Comments: UE support and cues provided to lean anteriorly, progressing from modA to min guard assist as she initially was leaning posteriorly. Postural control: Posterior lean Standing balance support: Bilateral upper extremity supported Standing balance-Leahy Scale: Poor Standing balance comment: Bilat UE support on therapist and bilat knee block provided with tactile and verbal cues provided to extend hips and knees and look superiorly, modA for safety. Pt stood 3x for ~30 sec to ~2 min duration each bout.  Cognition Arousal/Alertness: Awake/alert Behavior During Therapy: WFL for tasks assessed/performed Overall Cognitive Status: History of cognitive impairments - at baseline                                 General Comments: A& O to self, location,  month, and year. Pt required increased time and cues to follow commands. Able to follow single step commands.      Exercises      General Comments        Pertinent Vitals/Pain Pain Assessment: Faces Faces Pain Scale: Hurts a little bit Pain Location: genitals with pericare Pain Descriptors / Indicators: Discomfort;Grimacing Pain Intervention(s): Limited activity within patient's tolerance;Monitored during session;Repositioned    Home Living                      Prior Function            PT Goals (current goals can now be found in the care plan section) Acute Rehab PT Goals Patient Stated Goal: to improve PT Goal Formulation: With patient Time For Goal Achievement: 04/13/20 Potential to Achieve Goals: Good Progress towards PT goals: Progressing toward goals    Frequency    Min 3X/week      PT Plan Current plan remains appropriate    Co-evaluation              AM-PAC PT "6 Clicks" Mobility   Outcome Measure  Help needed turning from your back to your side while in a flat bed without using bedrails?: A Lot Help needed moving from lying on your back to sitting on the side of a flat bed without using bedrails?: A Lot Help needed moving to and from a bed to a chair (including a wheelchair)?: A Lot Help needed standing up from a chair using your arms (e.g., wheelchair or bedside chair)?: A Lot Help needed to walk in hospital room?: Total Help needed climbing 3-5 steps with a railing? : Total 6 Click Score: 10    End of Session Equipment Utilized During Treatment: Gait belt Activity Tolerance: Patient tolerated treatment well Patient left: in bed;with call bell/phone within reach;with bed alarm set Nurse Communication: Mobility status;Other (comment) (need for dressing change due to bowel movement) PT Visit Diagnosis: Unsteadiness on feet (R26.81);Difficulty in walking, not elsewhere classified (R26.2);Muscle weakness (generalized) (M62.81);Other  symptoms and signs involving the nervous system (R29.898)     Time: 2355-7322 PT Time Calculation (min) (ACUTE ONLY): 33 min  Charges:  $Therapeutic Activity: 8-22 mins $Neuromuscular Re-education: 8-22 mins                     Raymond Gurney, PT, DPT Acute Rehabilitation Services  Pager: [phone] Office: [phone]    Jewel Baize 03/25/2020, 1:07 PM

## 2020-03-25 NOTE — TOC Transition Note (Signed)
Transition of Care [facility]) - CM/SW Discharge Note   Patient Details  Name: MEAH JIRON MRN: [id number] Date of Birth: 1955-06-27  Transition of Care [facility]) CM/SW Contact:  Kermit Balo, RN Phone Number: 03/25/2020, 2:25 PM   Clinical Narrative:    Pt d/cing to [facility]. Bedside RN updated n d/c packet at the desk.  Number for report: [phone]   Final next level of care: Skilled Nursing Facility Barriers to Discharge: No Barriers Identified   Patient Goals and CMS Choice   CMS Medicare.gov Compare Post Acute Care list provided to:: Patient Represenative (must comment) Choice offered to / list presented to : Adult Children  Discharge Placement              Patient chooses bed at: [facility] Patient to be transferred to facility by: [facility] Name of family member notified: daughter Patient and family notified of of transfer: 03/25/20  Discharge Plan and Services In-house Referral: Clinical Social Work Discharge Planning Services: CM Consult Post Acute Care Choice: Skilled Nursing Facility                               Social Determinants of Health (SDOH) Interventions     Readmission Risk Interventions No flowsheet data found.

## 2020-05-04 ENCOUNTER — Inpatient Hospital Stay: Payer: Medicaid Other | Admitting: Adult Health

## 2020-05-06 ENCOUNTER — Telehealth: Payer: Self-pay | Admitting: Adult Health

## 2020-05-06 ENCOUNTER — Ambulatory Visit (INDEPENDENT_AMBULATORY_CARE_PROVIDER_SITE_OTHER): Payer: Medicaid Other | Admitting: Adult Health

## 2020-05-06 ENCOUNTER — Encounter: Payer: Self-pay | Admitting: Adult Health

## 2020-05-06 VITALS — BP 134/76 | HR 71

## 2020-05-06 DIAGNOSIS — I613 Nontraumatic intracerebral hemorrhage in brain stem: Secondary | ICD-10-CM

## 2020-05-06 DIAGNOSIS — E1165 Type 2 diabetes mellitus with hyperglycemia: Secondary | ICD-10-CM | POA: Diagnosis not present

## 2020-05-06 DIAGNOSIS — I10 Essential (primary) hypertension: Secondary | ICD-10-CM | POA: Diagnosis not present

## 2020-05-06 DIAGNOSIS — E785 Hyperlipidemia, unspecified: Secondary | ICD-10-CM

## 2020-05-06 NOTE — Patient Instructions (Signed)
Repeat CT head - if ICH resolved, will plan on starting aspirin 81mg  daily for secondary stroke prevention  Continue current treatment plan  Follow up once d/c from facility or once able to be accompanied by facility staff that is familiar with patient due to limited visit with speech and cognitive deficit

## 2020-05-06 NOTE — Progress Notes (Signed)
I agree with the above plan 

## 2020-05-06 NOTE — Progress Notes (Signed)
[facility] [address] [place]. [place] [address] [phone]       HOSPITAL FOLLOW UP NOTE  Brenda Riggs Date of Birth:  19-Feb-1956 Medical Record Number:  [id number]   Reason for Referral:  hospital stroke follow up    SUBJECTIVE:   CHIEF COMPLAINT:  Chief Complaint  Patient presents with  . Follow-up    Hospital follow up - ICH/CVA. She is here today with the transportation driver from [facility] and [facility]. The driver does not have knowledge of her medical history. The patient is non-verbal.     HPI:   Brenda Riggs a 65 y.o.femalewith history of prior strokes with residual dysarthria, ongoing tobacco use, diabetes, dementia (unreliable historian), hypertension, hyperlipidemia, COPD, who presented to the emergency room at [facility] on 03/21/2020 for evaluation of a witnessed fallresultingin a left forehead laceration. Personally reviewed hospitalization pertinent progress notes, lab work and imaging with summary provided. Transferred to [facility] evaluated by Dr. 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 MRI also showed multiple remote lacunar infarcts involving the hemispheric cerebral white matter, basal ganglia, thalami and pons as well as chronic microhemorrhages clustered about the deep gray nuclei, brainstem and dentate nuclei. History of HTN on metoprolol PTA and added amlodipine in addition to metoprolol with long-term BP goal normotensive range. LDL 138 and initiated statin at discharge but held during admission due to acute ICH. Controlled DM with A1c 5.3 on Metformin PTA continued at discharge. Other stroke risk factors include advanced age, current tobacco use, history of stroke/TIA and OSA. Other active problems include baseline dementia, heterogeneously enlarged thyroid gland found on imaging, forehead laceration, stage II pressure injury on coccyx and on Depakote  and Neurontin PTA. Evaluated by therapy and recommended discharge to SNF for ongoing therapy needs with residual dysarthria, mild left hemiparesis and dysphagia.  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 Location is suggestive of a hypertensive bleed. Additional 2-3 mm focus of hyperdensity within the left thalamus, concerning for an additional site of intraparenchymal hemorrhage. Moderately advanced chronic microvascular ischemic changes and cerebral volume loss.  MRI head- 1 cm acute intraparenchymal hemorrhage centered at the left pons, relatively stable in size and morphology from prior CT. No underlying lesion or significant enhancement seen. Given distribution, a hypertensive bleed is favored. Underlying age-related cerebral atrophy with advanced chronic microvascular ischemic disease and multiple remote lacunar infarcts involving the hemispheric cerebral white matter, basal ganglia, thalami, and pons. Innumerable superimposed chronic micro hemorrhages clustered about the deep gray nuclei, brainstem, and dentate nuclei, most likely reflecting sequelae of poorly controlled hypertension.   MRA head- Negative intracranial MRA for large vessel occlusion. No hemodynamically significant or correctable stenosis. Moderate small vessel atheromatous irregularity throughout the intracranial circulation.   MRA Neck- Short-segment 35% atheromatous stenosis at the origin of the right ICA. Otherwise wide patency of both carotid artery systems within the neck. Wide patency of both vertebral arteries within the neck. Left vertebral artery dominant.  Sars Corona Virus 2 - negative  LDL - 138  HgbA1c- 5.3  No antithromboticprior to admission, now on No  antithrombotic  Therapy recommendations: SNF  Disposition: Short-term SNF   Today, 05/06/2020, Brenda Riggs is being seen for hospital follow-up  accompanied by Cheyenne Adas SNF transporter.  Transporter is not familiar with patient therefore difficulty obtaining history during visit due to language difficulty and cognitive impairment.  She is able to follow simple commands with evidence of mild left hemiparesis.  She is currently sitting in wheelchair.  Able to say her name and month but disoriented to year and place.  Able to say simple words and phrases with delayed answers and slow speech and answer simple yes/no questions.  She does report being on softened food and ? thickened liquid diet (d/c on Dys 1 diet thin liquid).  Unable to accurately determine if she is currently participating in therapies stating "I think so".  Reviewed facility provided [facility] and remains on Lipitor 40 mg daily.  Blood pressure today 134/76 on amlodipine and metoprolol.  No other facility information provided to review.    ROS:   N/A  PMH:  Past Medical History:  Diagnosis Date  . Allergy   . Asthma   . Blood transfusion without reported diagnosis   . Chronic kidney disease   . COPD (chronic obstructive pulmonary disease) (HCC)   . Depression   . Diabetes mellitus without complication (HCC)   . Hyperlipidemia   . Hypertension   . Stroke [facility])     PSH:  Past Surgical History:  Procedure Laterality Date  . ABDOMINAL HYSTERECTOMY    . APPENDECTOMY    . CESAREAN SECTION    . TUBAL LIGATION      Social History:  Social History   Socioeconomic History  . Marital status: Single    Spouse name: Not on file  . Number of children: Not on file  . Years of education: Not on file  . Highest education level: Not on file  Occupational History  . Not on file  Tobacco Use  . Smoking status: Current Some Day Smoker    Packs/day: 0.10    Years: 41.00    Pack years: 4.10    Types: Cigarettes  . Smokeless  tobacco: Never Used  Vaping Use  . Vaping Use: Never used  Substance and Sexual Activity  . Alcohol use: No    Alcohol/week: 0.0 standard drinks  . Drug use: No  . Sexual activity: Not on file  Other Topics Concern  . Not on file  Social History Narrative  . Not on file   Social Determinants of Health   Financial Resource Strain: Not on file  Food Insecurity: Not on file  Transportation Needs: Not on file  Physical Activity: Not on file  Stress: Not on file  Social Connections: Not on file  Intimate Partner Violence: Not on file    Family History:  Family History  Problem Relation Age of Onset  . Heart disease Mother     Medications:   Current Outpatient Medications on File Prior to Visit  Medication Sig Dispense Refill  . acetaminophen (TYLENOL) 325 MG tablet Take 650 mg by mouth every 6 (six) hours as needed for headache (minor discomfort/fever up to 100 degrees orally).     Marland Kitchen albuterol (PROVENTIL HFA;VENTOLIN HFA) 108 (90 BASE) MCG/ACT inhaler Inhale 2 puffs into the lungs 4 (four) times daily as needed for wheezing or shortness of breath.     Marland Kitchen alum & mag hydroxide-simeth (MAALOX/MYLANTA) 200-200-20 MG/5ML suspension Take 30 mLs by mouth once as needed for indigestion or heartburn.    Marland Kitchen amLODipine (NORVASC) 5 MG tablet Take 1 tablet (5 mg total) by mouth daily.    Marland Kitchen atorvastatin (  LIPITOR) 40 MG tablet Take 1 tablet (40 mg total) by mouth daily at 6 PM.    . beclomethasone (QVAR REDIHALER) 40 MCG/ACT inhaler Inhale 2 puffs into the lungs 2 (two) times daily.    . cholecalciferol (VITAMIN D3) 25 MCG (1000 UNIT) tablet Take 1,000 Units by mouth daily.    . diclofenac Sodium (VOLTAREN) 1 % GEL Apply 2 g topically 2 (two) times daily. Apply to left shoulder    . dimenhyDRINATE (DRAMAMINE) 50 MG tablet Take 50 mg by mouth every 4 (four) hours as needed (nausea/vomiting).    Marland Kitchen divalproex (DEPAKOTE ER) 250 MG 24 hr tablet Take 250 mg by mouth at bedtime.    . fluPHENAZine  (PROLIXIN) 5 MG tablet Take 5 mg by mouth at bedtime.     . fluticasone (FLONASE) 50 MCG/ACT nasal spray Place 1 spray into both nostrils daily.    Marland Kitchen gabapentin (NEURONTIN) 100 MG capsule Take 200 mg by mouth at bedtime.    Marland Kitchen guaifenesin (ROBITUSSIN) 100 MG/5ML syrup Take 200 mg by mouth every 6 (six) hours as needed for cough.    . liver oil-zinc oxide (DESITIN) 40 % ointment Apply 1 application topically as needed for irritation. Apply to buttocks    . loperamide (IMODIUM A-D) 2 MG tablet Take 2 mg by mouth 4 (four) times daily as needed for diarrhea or loose stools.     . magnesium hydroxide (MILK OF MAGNESIA) 400 MG/5ML suspension Take 22.5 mLs by mouth every 8 (eight) hours as needed (constipation (if no relief in 8 hours give with 6 oz prune juice)).     . metFORMIN (GLUCOPHAGE) 500 MG tablet Take 500 mg by mouth every 12 (twelve) hours.     . metoprolol tartrate (LOPRESSOR) 50 MG tablet Take 1 tablet (50 mg total) by mouth 2 (two) times daily.    . Nutritional Supplements (NUTRITIONAL SUPPLEMENT PO) Take 1 each by mouth 3 (three) times daily with meals.    . Sodium Phosphates (FLEET ENEMA RE) Place 1 enema rectally daily as needed (constipation not relieved by MOM and prune juice).     No current facility-administered medications on file prior to visit.    Allergies:   Allergies  Allergen Reactions  . Penicillins Other (See Comments)    Per caregiver. Unknown reaction Did it involve swelling of the face/tongue/throat, SOB, or low BP? Unknown Did it involve sudden or severe rash/hives, skin peeling, or any reaction on the inside of your mouth or nose? Unknown Did you need to seek medical attention at a hospital or doctor's office? Unknown When did it last happen?unknown If all above answers are "NO", may proceed with cephalosporin use.  . Thioridazine Hcl Other (See Comments)    Present in Epic; not noted on MAR      OBJECTIVE:  Physical Exam  Vitals:   05/06/20 1400   BP: 134/76  Pulse: 71   There is no height or weight on file to calculate BMI. No exam data present  General: Frail pleasant middle-aged African-American female, seated in w/c, in no evident distress Head: head normocephalic and atraumatic.   Neck: supple with no carotid or supraclavicular bruits Cardiovascular: regular rate and rhythm, no murmurs Musculoskeletal: no deformity Skin:  no rash/petichiae Vascular:  Normal pulses all extremities   Neurologic Exam Mental Status: Awake and fully alert.   Dysarthric slowed speech with delayed responses.  Able to follow simple step commands.  Able to answer simple yes/no questions.  Able to say  simple words and phrases with difficulty understanding at times.  Oriented to self and month but disoriented to place and year. Recent and remote memory impaired. Attention span, concentration and fund of knowledge impaired. Mood and affect appropriate.  Cranial Nerves: Fundoscopic exam reveals sharp disc margins. Pupils equal, briskly reactive to light. Extraocular movements full without nystagmus. Visual fields full to confrontation. Hearing intact. Facial sensation intact.  Left lower facial weakness.  Tongue and palate moves normally and symmetrically.  Motor: Right upper and lower extremity 5/5 but with increased tone and slowed movements.  LUE 4+/5 with increased tone; LLE: 4/5 with increased tone Sensory.:  Response to painful stimuli bilaterally. Coordination: Rapid alternating movements slowed in all extremities. Finger-to-nose and heel-to-shin performed accurately bilaterally. Gait and Station: Deferred Reflexes: 2+ and symmetric. Toes downgoing.     NIHSS  5 Modified Rankin  4      ASSESSMENT: Brenda Riggs is a 65 y.o. 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 Vascular risk factors include prior strokes with  residual dysarthria, current tobacco use, uncontrolled HTN, HLD, DM, dementia and advanced age.      PLAN:  1. Hypertensive left pontine ICH:  a. Residual deficit: Left Hemiparesis, dysphagia, speech impairment and cognitive impairment.  She is currently residing at [facility].  Unable to determine if patient currently working with therapies but if so, would recommend continuation b. Repeat CT head wo contrast and if ICH resolved will recommend initiating aspirin 81 mg daily for secondary stroke prevention c. Continue atorvastatin for secondary stroke prevention.   d. Unable to appropriately provide education due to cognitive impairment and baseline dementia  2. HTN: BP goal <130/90.  Stable at today's visit on amlodipine and metoprolol per PCP 3. HLD: LDL goal <70. Recent LDL 138.  Initiate atorvastatin 40 mg daily during recent stroke admission.  Request follow-up at SNF for repeat lipid panel in the next 1 to 2 months 4. DMII: A1c goal<7.0. Recent A1c 5.3 on Metformin per PCP.     Recommend follow-up once discharged from SNF or if able to be accompanied by family or facility staff member who is familiar with patient due to difficulty with today's visit   I spent 45 minutes of face-to-face and non-face-to-face time with patient and transporter.  This included previsit chart review including recent hospitalization pertinent progress notes, lab work and imaging, lab review, study review, order entry, electronic health record documentation, review of MAR provided by facility, explanation on obtaining CT head and further discussed with transporter, attempted education on recent stroke and etiology as well as residual deficits and answered all other questions to patient's satisfaction   Ihor Austin, AGNP-BC  [facility] [address] [place], [place] [address]  Phone [phone] Fax [phone] Note: This document was prepared with digital  dictation and possible smart phrase technology. Any transcriptional errors that result from this process are unintentional.

## 2020-05-06 NOTE — Telephone Encounter (Signed)
medicaid order sent to GI. They will reach out to the patient to schedule  °

## 2020-08-04 ENCOUNTER — Ambulatory Visit (INDEPENDENT_AMBULATORY_CARE_PROVIDER_SITE_OTHER): Payer: Medicaid Other

## 2020-08-04 ENCOUNTER — Encounter: Payer: Self-pay | Admitting: Family

## 2020-08-04 ENCOUNTER — Ambulatory Visit (INDEPENDENT_AMBULATORY_CARE_PROVIDER_SITE_OTHER): Payer: Medicaid Other | Admitting: Family

## 2020-08-04 DIAGNOSIS — M25561 Pain in right knee: Secondary | ICD-10-CM

## 2020-08-05 NOTE — Progress Notes (Signed)
Office Visit Note   Patient: Brenda Riggs           Date of Birth: 28-Jun-1955           MRN: [id number] Visit Date: 08/04/2020              Requested by: [name], MD [id number] [place],  [place] [address] PCP: [name], MD  Chief Complaint  Patient presents with  . Right Knee - Pain    S/p fall in SNF       HPI: The patient is a 65 year old woman who presents today complaining of right knee pain.  She is seen following a fall was in the fall of last year according to the skilled nursing staff who accompany the visit.  Today she is in a wheelchair for her mobility.  According to staff she is in the wheelchair due to her right knee pain.  Of note she is status post stroke.  She is residing at skilled nursing.  She has not ambulated in months.  She is not currently working with physical therapy.  It is unfortunately unclear how she fell.  Patient is unable to provide history.  Appears to have global knee pain  Assessment & Plan: Visit Diagnoses:  1. Acute pain of right knee     Plan: Radiographs consistent with osteoarthritis moderate to severe.  She is deconditioned and has been wheelchair-bound.  We will begin with physical therapy have written an order for therapy to begin working in mobilizing working on strengthening she will follow-up in 4 weeks.  Consider Depo-Medrol injection at that time  Follow-Up Instructions: Return in about 4 weeks (around 09/01/2020).   Right Knee Exam   Tenderness  The patient is experiencing tenderness in the patella (Global).  Other  Erythema: absent Swelling: none Effusion: no effusion present  Comments:  Stiffness.  Lacks about 20 degrees of full extension passively.  Is ligamentously stable   Left Knee Exam   Tenderness  The patient is experiencing tenderness in the patella.      Patient is alert, oriented, no adenopathy, well-dressed, normal affect, normal respiratory  effort.   Imaging: No results found. No images are attached to the encounter.  Labs: Lab Results  Component Value Date   HGBA1C 5.3 03/21/2020   HGBA1C 6.2 09/09/2009   HGBA1C (H) 09/01/2009    5.8 (NOTE)                                                                       According to the ADA Clinical Practice Recommendations for 2011, when HbA1c is used as a screening test:   >=6.5%   Diagnostic of Diabetes Mellitus           (if abnormal result  is confirmed)  5.7-6.4%   Increased risk of developing Diabetes Mellitus  References:Diagnosis and Classification of Diabetes Mellitus,Diabetes Care,2011,34(Suppl 1):S62-S69 and Standards of Medical Care in         Diabetes - 2011,Diabetes Care,2011,34  (Suppl 1):S11-S61.   REPTSTATUS 02/27/2018 FINAL 02/25/2018   CULT MULTIPLE SPECIES PRESENT, SUGGEST RECOLLECTION (A) 02/25/2018     Lab Results  Component Value Date   ALBUMIN 4.1 03/21/2020   ALBUMIN 3.0 (  L) 03/31/2017   ALBUMIN 3.7 03/28/2017    No results found for: MG No results found for: VD25OH  No results found for: PREALBUMIN CBC EXTENDED Latest Ref Rng & Units 03/21/2020 07/08/2019 02/25/2018  WBC 4.0 - 10.5 K/uL 6.8 5.1 8.1  RBC 3.87 - 5.11 MIL/uL 3.67(L) 3.53(L) 3.47(L)  HGB 12.0 - 15.0 g/dL 11.4(L) 11.0(L) 10.6(L)  HCT 36.0 - 46.0 % 35.1(L) 33.7(L) 33.6(L)  PLT 150 - 400 K/uL 337 265 292  NEUTROABS 1.7 - 7.7 K/uL - 2.4 5.6  LYMPHSABS 0.7 - 4.0 K/uL - 2.1 1.7     There is no height or weight on file to calculate BMI.  Orders:  Orders Placed This Encounter  Procedures  . XR Knee 1-2 Views Right   No orders of the defined types were placed in this encounter.    Procedures: No procedures performed  Clinical Data: No additional findings.  ROS:  All other systems negative, except as noted in the HPI. Review of Systems  Constitutional: Negative for chills and fever.  Musculoskeletal: Positive for arthralgias and gait problem. Negative for joint swelling.   Neurological: Positive for weakness.    Objective: Vital Signs: There were no vitals taken for this visit.  Specialty Comments:  No specialty comments available.  PMFS History: Patient Active Problem List   Diagnosis Date Noted  . Thyroid enlargement 03/25/2020  . Laceration of forehead 03/25/2020  . Pressure injury of skin 03/23/2020  . 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 OSA (obstructive sleep apnea) 07/01/2014  . Cigarette smoker 06/28/2014  . Wheezing 06/27/2014  . MENOPAUSE, SURGICAL 09/09/2009  . INSECT BITE 12/19/2008  . Essential hypertension, benign 09/26/2008  . Type II diabetes mellitus (HCC) 11/02/2006  . 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 TOBACCO USER 11/02/2006  . MENTAL RETARDATION, MILD 11/02/2006  . ASTHMA 11/02/2006  . HIDRADENITIS SUPPURATIVA 11/02/2006  . Hyperlipidemia 12/21/2004  . 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 STROKE 10/06/2004  . ATAXIA 08/21/2004  . SYMPTOM, HYPERSOMNIA W/SLEEP APNEA NOS 11/09/1999  . BREAST MASS, LEFT 08/18/1998   Past Medical History:  Diagnosis Date  . Allergy   . Asthma   . Blood transfusion without reported diagnosis   . Chronic kidney disease   . COPD (chronic obstructive pulmonary disease) (HCC)   . Depression   . Diabetes mellitus without complication (HCC)   . Hyperlipidemia   . Hypertension   . Stroke [facility])     Family History  Problem Relation Age of Onset  . Heart disease Mother     Past Surgical History:  Procedure Laterality Date  . ABDOMINAL HYSTERECTOMY    . APPENDECTOMY    . CESAREAN SECTION    . TUBAL LIGATION     Social History   Occupational History  . Not on file  Tobacco Use  . Smoking status: Current Some Day Smoker    Packs/day: 0.10    Years: 41.00    Pack years: 4.10    Types: Cigarettes  . Smokeless tobacco: Never Used  Vaping Use  . Vaping Use: Never used  Substance and Sexual Activity  . Alcohol use: No    Alcohol/week:  0.0 standard drinks  . Drug use: No  . Sexual activity: Not on file

## 2020-08-15 ENCOUNTER — Other Ambulatory Visit: Payer: Self-pay

## 2020-08-15 ENCOUNTER — Emergency Department (HOSPITAL_COMMUNITY): Payer: Medicaid Other

## 2020-08-15 ENCOUNTER — Inpatient Hospital Stay (HOSPITAL_COMMUNITY)
Admission: EM | Admit: 2020-08-15 | Discharge: 2020-08-20 | DRG: 070 | Disposition: A | Discharge status: Skilled Nursing Facility | Payer: Medicaid Other | Attending: Internal Medicine | Admitting: Internal Medicine

## 2020-08-15 ENCOUNTER — Encounter (HOSPITAL_COMMUNITY): Payer: Self-pay | Admitting: Emergency Medicine

## 2020-08-15 DIAGNOSIS — I10 Essential (primary) hypertension: Secondary | ICD-10-CM | POA: Diagnosis not present

## 2020-08-15 DIAGNOSIS — E860 Dehydration: Secondary | ICD-10-CM | POA: Diagnosis present

## 2020-08-15 DIAGNOSIS — L8990 Pressure ulcer of unspecified site, unspecified stage: Secondary | ICD-10-CM | POA: Diagnosis present

## 2020-08-15 DIAGNOSIS — E876 Hypokalemia: Secondary | ICD-10-CM

## 2020-08-15 DIAGNOSIS — I6930 Unspecified sequelae of cerebral infarction: Secondary | ICD-10-CM

## 2020-08-15 DIAGNOSIS — R1310 Dysphagia, unspecified: Secondary | ICD-10-CM | POA: Diagnosis not present

## 2020-08-15 DIAGNOSIS — E119 Type 2 diabetes mellitus without complications: Secondary | ICD-10-CM | POA: Diagnosis present

## 2020-08-15 DIAGNOSIS — Z7984 Long term (current) use of oral hypoglycemic drugs: Secondary | ICD-10-CM

## 2020-08-15 DIAGNOSIS — Z20822 Contact with and (suspected) exposure to covid-19: Secondary | ICD-10-CM | POA: Diagnosis present

## 2020-08-15 DIAGNOSIS — F200 Paranoid schizophrenia: Secondary | ICD-10-CM

## 2020-08-15 DIAGNOSIS — R4182 Altered mental status, unspecified: Secondary | ICD-10-CM

## 2020-08-15 DIAGNOSIS — D638 Anemia in other chronic diseases classified elsewhere: Secondary | ICD-10-CM | POA: Diagnosis present

## 2020-08-15 DIAGNOSIS — E785 Hyperlipidemia, unspecified: Secondary | ICD-10-CM | POA: Diagnosis present

## 2020-08-15 DIAGNOSIS — G4733 Obstructive sleep apnea (adult) (pediatric): Secondary | ICD-10-CM

## 2020-08-15 DIAGNOSIS — Z79899 Other long term (current) drug therapy: Secondary | ICD-10-CM

## 2020-08-15 DIAGNOSIS — J449 Chronic obstructive pulmonary disease, unspecified: Secondary | ICD-10-CM | POA: Diagnosis present

## 2020-08-15 DIAGNOSIS — L89309 Pressure ulcer of unspecified buttock, unspecified stage: Secondary | ICD-10-CM

## 2020-08-15 DIAGNOSIS — D72829 Elevated white blood cell count, unspecified: Secondary | ICD-10-CM | POA: Diagnosis present

## 2020-08-15 DIAGNOSIS — J45909 Unspecified asthma, uncomplicated: Secondary | ICD-10-CM

## 2020-08-15 DIAGNOSIS — F0390 Unspecified dementia without behavioral disturbance: Secondary | ICD-10-CM | POA: Diagnosis present

## 2020-08-15 DIAGNOSIS — R4189 Other symptoms and signs involving cognitive functions and awareness: Secondary | ICD-10-CM

## 2020-08-15 DIAGNOSIS — Z8249 Family history of ischemic heart disease and other diseases of the circulatory system: Secondary | ICD-10-CM

## 2020-08-15 DIAGNOSIS — Z880 Allergy status to penicillin: Secondary | ICD-10-CM

## 2020-08-15 DIAGNOSIS — L89224 Pressure ulcer of left hip, stage 4: Secondary | ICD-10-CM | POA: Diagnosis present

## 2020-08-15 DIAGNOSIS — Z888 Allergy status to other drugs, medicaments and biological substances status: Secondary | ICD-10-CM

## 2020-08-15 DIAGNOSIS — G9341 Metabolic encephalopathy: Principal | ICD-10-CM | POA: Diagnosis present

## 2020-08-15 DIAGNOSIS — E1165 Type 2 diabetes mellitus with hyperglycemia: Secondary | ICD-10-CM

## 2020-08-15 DIAGNOSIS — I69922 Dysarthria following unspecified cerebrovascular disease: Secondary | ICD-10-CM

## 2020-08-15 DIAGNOSIS — G9340 Encephalopathy, unspecified: Secondary | ICD-10-CM | POA: Diagnosis present

## 2020-08-15 HISTORY — DX: Unspecified sequelae of cerebral infarction: I69.30

## 2020-08-15 LAB — CBC WITH DIFFERENTIAL/PLATELET
Abs Immature Granulocytes: 0.05 10*3/uL (ref 0.00–0.07)
Basophils Absolute: 0 10*3/uL (ref 0.0–0.1)
Basophils Relative: 0 %
Eosinophils Absolute: 0.1 10*3/uL (ref 0.0–0.5)
Eosinophils Relative: 1 %
HCT: 27.3 % — ABNORMAL LOW (ref 36.0–46.0)
Hemoglobin: 8.6 g/dL — ABNORMAL LOW (ref 12.0–15.0)
Immature Granulocytes: 0 %
Lymphocytes Relative: 16 %
Lymphs Abs: 1.9 10*3/uL (ref 0.7–4.0)
MCH: 30.2 pg (ref 26.0–34.0)
MCHC: 31.5 g/dL (ref 30.0–36.0)
MCV: 95.8 fL (ref 80.0–100.0)
Monocytes Absolute: 1.4 10*3/uL — ABNORMAL HIGH (ref 0.1–1.0)
Monocytes Relative: 11 %
Neutro Abs: 8.8 10*3/uL — ABNORMAL HIGH (ref 1.7–7.7)
Neutrophils Relative %: 72 %
Platelets: 422 10*3/uL — ABNORMAL HIGH (ref 150–400)
RBC: 2.85 MIL/uL — ABNORMAL LOW (ref 3.87–5.11)
RDW: 13.2 % (ref 11.5–15.5)
WBC: 12.2 10*3/uL — ABNORMAL HIGH (ref 4.0–10.5)
nRBC: 0 % (ref 0.0–0.2)

## 2020-08-15 LAB — COMPREHENSIVE METABOLIC PANEL
ALT: 12 U/L (ref 0–44)
AST: 15 U/L (ref 15–41)
Albumin: 2.5 g/dL — ABNORMAL LOW (ref 3.5–5.0)
Alkaline Phosphatase: 51 U/L (ref 38–126)
Anion gap: 8 (ref 5–15)
BUN: 20 mg/dL (ref 8–23)
CO2: 31 mmol/L (ref 22–32)
Calcium: 8.3 mg/dL — ABNORMAL LOW (ref 8.9–10.3)
Chloride: 108 mmol/L (ref 98–111)
Creatinine, Ser: 0.44 mg/dL (ref 0.44–1.00)
GFR, Estimated: >60 mL/min (ref >60)
Glucose, Bld: 101 mg/dL — ABNORMAL HIGH (ref 70–99)
Potassium: 3 mmol/L — ABNORMAL LOW (ref 3.5–5.1)
Sodium: 147 mmol/L — ABNORMAL HIGH (ref 135–145)
Total Bilirubin: 0.4 mg/dL (ref 0.3–1.2)
Total Protein: 6.6 g/dL (ref 6.5–8.1)

## 2020-08-15 LAB — PROTIME-INR
INR: 1.1 (ref 0.8–1.2)
Prothrombin Time: 14.1 seconds (ref 11.4–15.2)

## 2020-08-15 LAB — LACTIC ACID, PLASMA
Lactic Acid, Venous: 1.3 mmol/L (ref 0.5–1.9)
Lactic Acid, Venous: 0.9 mmol/L (ref 0.5–1.9)

## 2020-08-15 LAB — RESP PANEL BY RT-PCR (FLU A&B, COVID) ARPGX2
Influenza A by PCR: NEGATIVE
Influenza B by PCR: NEGATIVE
SARS Coronavirus 2 by RT PCR: NEGATIVE

## 2020-08-15 LAB — TROPONIN I (HIGH SENSITIVITY)
Troponin I (High Sensitivity): 18 ng/L — ABNORMAL HIGH (ref <18)
Troponin I (High Sensitivity): 17 ng/L (ref <18)

## 2020-08-15 LAB — VALPROIC ACID LEVEL: Valproic Acid Lvl: 15 ug/mL — ABNORMAL LOW (ref 50.0–100.0)

## 2020-08-15 MED ORDER — BUDESONIDE 0.25 MG/2ML IN SUSP
0.2500 mg | Freq: Two times a day (BID) | RESPIRATORY_TRACT | Status: DC
  Administered 2020-08-16 – 2020-08-20 (×9): 0.25 mg via RESPIRATORY_TRACT
  Filled 2020-08-15 (×9): qty 2

## 2020-08-15 MED ORDER — ACETAMINOPHEN 650 MG RE SUPP: 650.0000 mg | Freq: Four times a day (QID) | RECTAL | Status: DC | PRN

## 2020-08-15 MED ORDER — ONDANSETRON HCL 4 MG/2ML IJ SOLN: 4.0000 mg | Freq: Four times a day (QID) | INTRAMUSCULAR | Status: DC | PRN

## 2020-08-15 MED ORDER — ONDANSETRON HCL 4 MG PO TABS: 4.0000 mg | ORAL_TABLET | Freq: Four times a day (QID) | ORAL | Status: DC | PRN

## 2020-08-15 MED ORDER — ACETAMINOPHEN 325 MG PO TABS
650.0000 mg | ORAL_TABLET | Freq: Four times a day (QID) | ORAL | Status: DC | PRN
  Administered 2020-08-16 – 2020-08-20 (×3): 650 mg via ORAL
  Filled 2020-08-15 (×3): qty 2

## 2020-08-15 MED ORDER — ALUM & MAG HYDROXIDE-SIMETH 200-200-20 MG/5ML PO SUSP: 30.0000 mL | Freq: Once | ORAL | Status: DC | PRN

## 2020-08-15 MED ORDER — ALBUTEROL SULFATE HFA 108 (90 BASE) MCG/ACT IN AERS
2.0000 | INHALATION_SPRAY | Freq: Four times a day (QID) | RESPIRATORY_TRACT | Status: DC | PRN
  Filled 2020-08-15: qty 6.7

## 2020-08-15 MED ORDER — POTASSIUM CHLORIDE IN NACL 20-0.9 MEQ/L-% IV SOLN
INTRAVENOUS | Status: AC
  Filled 2020-08-15: qty 1000

## 2020-08-15 MED ORDER — BISACODYL 5 MG PO TBEC: 5.0000 mg | DELAYED_RELEASE_TABLET | Freq: Every day | ORAL | Status: DC | PRN

## 2020-08-15 MED ORDER — BECLOMETHASONE DIPROP HFA 40 MCG/ACT IN AERB: 2.0000 | INHALATION_SPRAY | Freq: Two times a day (BID) | RESPIRATORY_TRACT | Status: DC

## 2020-08-15 MED ORDER — FLUPHENAZINE HCL 5 MG PO TABS
5.0000 mg | ORAL_TABLET | Freq: Every day | ORAL | Status: DC
  Filled 2020-08-15: qty 1

## 2020-08-15 MED ORDER — POLYETHYLENE GLYCOL 3350 17 G PO PACK: 17.0000 g | PACK | Freq: Every day | ORAL | Status: DC | PRN

## 2020-08-15 MED ORDER — ATORVASTATIN CALCIUM 40 MG PO TABS
40.0000 mg | ORAL_TABLET | Freq: Every day | ORAL | Status: DC
  Administered 2020-08-17 – 2020-08-20 (×4): 40 mg via ORAL
  Filled 2020-08-15 (×4): qty 1

## 2020-08-15 MED ORDER — DIVALPROEX SODIUM ER 250 MG PO TB24
250.0000 mg | ORAL_TABLET | Freq: Every day | ORAL | Status: DC
  Administered 2020-08-16 – 2020-08-19 (×4): 250 mg via ORAL
  Filled 2020-08-15 (×4): qty 1

## 2020-08-15 MED ORDER — SODIUM CHLORIDE 0.9 % IV SOLN: Freq: Once | INTRAVENOUS | Status: AC

## 2020-08-15 MED ORDER — POTASSIUM CHLORIDE CRYS ER 20 MEQ PO TBCR: 30.0000 meq | EXTENDED_RELEASE_TABLET | Freq: Once | ORAL | Status: DC

## 2020-08-15 MED ORDER — INSULIN ASPART 100 UNIT/ML ~~LOC~~ SOLN
0.0000 [IU] | Freq: Three times a day (TID) | SUBCUTANEOUS | Status: DC
  Administered 2020-08-17 – 2020-08-19 (×4): 2 [IU] via SUBCUTANEOUS
  Filled 2020-08-15: qty 0.09

## 2020-08-15 MED ORDER — GUAIFENESIN 100 MG/5ML PO SOLN: 200.0000 mg | Freq: Four times a day (QID) | ORAL | Status: DC | PRN

## 2020-08-15 MED ORDER — SODIUM CHLORIDE 0.9 % IV BOLUS
500.0000 mL | Freq: Once | INTRAVENOUS | Status: AC
  Administered 2020-08-15: 500 mL via INTRAVENOUS

## 2020-08-15 MED ORDER — METOPROLOL TARTRATE 50 MG PO TABS
50.0000 mg | ORAL_TABLET | Freq: Two times a day (BID) | ORAL | Status: DC
  Administered 2020-08-16 – 2020-08-20 (×9): 50 mg via ORAL
  Filled 2020-08-15 (×9): qty 1

## 2020-08-15 MED ORDER — AMLODIPINE BESYLATE 5 MG PO TABS
5.0000 mg | ORAL_TABLET | Freq: Every day | ORAL | Status: DC
  Administered 2020-08-16 – 2020-08-20 (×5): 5 mg via ORAL
  Filled 2020-08-15 (×5): qty 1

## 2020-08-15 NOTE — ED Provider Notes (Signed)
[facility]-EMERGENCY DEPT Provider Note   CSN: [id number] Arrival date & time: 08/15/20  1515     History Chief Complaint  Patient presents with  . Weakness    Brenda Riggs is a 65 y.o. female.  65 year old female with prior medical history as detailed below presents for evaluation.  Patient was sent from her facility for decreased mental status and evaluation of weakness.  Patient is unable provide significant history.  Level 5 caveat secondary to same.  Patient with extensive history of prior CVA and resulting significant dementia, dysarthria, and disability.  The history is provided by the patient and medical records.  Illness Location:  Ams, decrease LOC, weakness Severity:  Unable to specify Timing:  Unable to specify Progression:  Unable to specify      Past Medical History:  Diagnosis Date  . Allergy   . Asthma   . Blood transfusion without reported diagnosis   . Chronic kidney disease   . COPD (chronic obstructive pulmonary disease) (HCC)   . Depression   . Diabetes mellitus without complication (HCC)   . Hyperlipidemia   . Hypertension   . Stroke [facility])     Patient Active Problem List   Diagnosis Date Noted  . Thyroid enlargement 03/25/2020  . Laceration of forehead 03/25/2020  . Pressure injury of skin 03/23/2020  . 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 OSA (obstructive sleep apnea) 07/01/2014  . Cigarette smoker 06/28/2014  . Wheezing 06/27/2014  . MENOPAUSE, SURGICAL 09/09/2009  . INSECT BITE 12/19/2008  . Essential hypertension, benign 09/26/2008  . Type II diabetes mellitus (HCC) 11/02/2006  . 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 TOBACCO USER 11/02/2006  . MENTAL RETARDATION, MILD 11/02/2006  . ASTHMA 11/02/2006  . HIDRADENITIS SUPPURATIVA 11/02/2006  . Hyperlipidemia 12/21/2004  . 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 STROKE 10/06/2004  . ATAXIA 08/21/2004  . SYMPTOM,  HYPERSOMNIA W/SLEEP APNEA NOS 11/09/1999  . BREAST MASS, LEFT 08/18/1998    Past Surgical History:  Procedure Laterality Date  . ABDOMINAL HYSTERECTOMY    . APPENDECTOMY    . CESAREAN SECTION    . TUBAL LIGATION       OB History   No obstetric history on file.     Family History  Problem Relation Age of Onset  . Heart disease Mother     Social History   Tobacco Use  . Smoking status: Current Some Day Smoker    Packs/day: 0.10    Years: 41.00    Pack years: 4.10    Types: Cigarettes  . Smokeless tobacco: Never Used  Vaping Use  . Vaping Use: Never used  Substance Use Topics  . Alcohol use: No    Alcohol/week: 0.0 standard drinks  . Drug use: No    Home Medications Prior to Admission medications   Medication Sig Start Date End Date Taking? Authorizing Provider  acetaminophen (TYLENOL) 325 MG tablet Take 650 mg by mouth every 6 (six) hours as needed for headache (minor discomfort/fever up to 100 degrees orally).    Yes [provider]  albuterol (PROVENTIL HFA;VENTOLIN HFA) 108 (90 BASE) MCG/ACT inhaler Inhale 2 puffs into the lungs 4 (four) times daily as needed for wheezing or shortness of breath.    Yes [provider]  alum & mag hydroxide-simeth (MAALOX/MYLANTA) 200-200-20 MG/5ML suspension Take 30 mLs by mouth once as needed for indigestion or heartburn.   Yes [provider]  amLODipine (NORVASC) 5 MG tablet Take  1 tablet (5 mg total) by mouth daily. 03/26/20  Yes Layne Benton, NP  atorvastatin (LIPITOR) 40 MG tablet Take 1 tablet (40 mg total) by mouth daily at 6 PM. 03/25/20  Yes Biby, Jani Files, NP  beclomethasone (QVAR REDIHALER) 40 MCG/ACT inhaler Inhale 2 puffs into the lungs 2 (two) times daily.   Yes [provider]  cholecalciferol (VITAMIN D3) 25 MCG (1000 UNIT) tablet Take 1,000 Units by mouth daily.   Yes [provider]  dimenhyDRINATE (DRAMAMINE) 50 MG tablet Take 50 mg by mouth every 4 (four) hours as  needed (nausea/vomiting).   Yes [provider]  divalproex (DEPAKOTE ER) 250 MG 24 hr tablet Take 250 mg by mouth at bedtime.   Yes [provider]  fluPHENAZine (PROLIXIN) 5 MG tablet Take 5 mg by mouth at bedtime.    Yes [provider]  fluticasone (FLONASE) 50 MCG/ACT nasal spray Place 1 spray into both nostrils daily.   Yes [provider]  gabapentin (NEURONTIN) 100 MG capsule Take 200 mg by mouth 2 (two) times daily.   Yes [provider]  guaifenesin (ROBITUSSIN) 100 MG/5ML syrup Take 200 mg by mouth every 6 (six) hours as needed for cough.   Yes [provider]  HYDROcodone-acetaminophen (NORCO/VICODIN) 5-325 MG tablet Take 1 tablet by mouth in the morning and at bedtime.   Yes [provider]  loperamide (IMODIUM A-D) 2 MG tablet Take 2 mg by mouth 4 (four) times daily as needed for diarrhea or loose stools.    Yes [provider]  magnesium hydroxide (MILK OF MAGNESIA) 400 MG/5ML suspension Take 22.5 mLs by mouth every 8 (eight) hours as needed (constipation (if no relief in 8 hours give with 6 oz prune juice)).    Yes [provider]  metFORMIN (GLUCOPHAGE) 500 MG tablet Take 500 mg by mouth every 12 (twelve) hours.    Yes [provider]  metoprolol tartrate (LOPRESSOR) 50 MG tablet Take 1 tablet (50 mg total) by mouth 2 (two) times daily. 03/25/20  Yes Layne Benton, NP  Nutritional Supplements (NUTRITIONAL SUPPLEMENT PO) Take 1 each by mouth 3 (three) times daily with meals.   Yes [provider]  Pollen Extracts (PROSTAT PO) Take 30 Units by mouth in the morning and at bedtime.   Yes [provider]  Sodium Phosphates (FLEET ENEMA RE) Place 1 enema rectally daily as needed (constipation not relieved by MOM and prune juice).   Yes [provider]  vitamin C (ASCORBIC ACID) 500 MG tablet Take 500 mg by mouth daily.   Yes [provider]    Allergies     Penicillins and Thioridazine hcl  Review of Systems   Review of Systems  All other systems reviewed and are negative.   Physical Exam Updated Vital Signs BP (!) 151/62   Pulse 91   Temp 99.2 F (37.3 C) (Oral)   Resp 14   SpO2 99%   Physical Exam Vitals and nursing note reviewed.  Constitutional:      General: She is not in acute distress.    Appearance: She is well-developed.  HENT:     Head: Normocephalic and atraumatic.  Eyes:     Conjunctiva/sclera: Conjunctivae normal.     Pupils: Pupils are equal, round, and reactive to light.  Cardiovascular:     Rate and Rhythm: Normal rate and regular rhythm.     Heart sounds: Normal heart sounds.  Pulmonary:  Effort: Pulmonary effort is normal. No respiratory distress.     Breath sounds: Normal breath sounds.  Abdominal:     General: There is no distension.     Palpations: Abdomen is soft.     Tenderness: There is no abdominal tenderness.  Musculoskeletal:        General: No deformity. Normal range of motion.     Cervical back: Normal range of motion and neck supple.  Skin:    General: Skin is warm and dry.  Neurological:     Mental Status: She is alert.     Comments: Alert, nonverbal  No apparent new focal deficit      ED Results / Procedures / Treatments   Labs (all labs ordered are listed, but only abnormal results are displayed) Labs Reviewed  CBC WITH DIFFERENTIAL/PLATELET - Abnormal; Notable for the following components:      Result Value   WBC 12.2 (*)    RBC 2.85 (*)    Hemoglobin 8.6 (*)    HCT 27.3 (*)    Platelets 422 (*)    Neutro Abs 8.8 (*)    Monocytes Absolute 1.4 (*)    All other components within normal limits  COMPREHENSIVE METABOLIC PANEL - Abnormal; Notable for the following components:   Sodium 147 (*)    Potassium 3.0 (*)    Glucose, Bld 101 (*)    Calcium 8.3 (*)    Albumin 2.5 (*)    All other components within normal limits  VALPROIC ACID LEVEL - Abnormal; Notable for the  following components:   Valproic Acid Lvl 15 (*)    All other components within normal limits  TROPONIN I (HIGH SENSITIVITY) - Abnormal; Notable for the following components:   Troponin I (High Sensitivity) 18 (*)    All other components within normal limits  CULTURE, BLOOD (ROUTINE X 2)  CULTURE, BLOOD (ROUTINE X 2)  RESP PANEL BY RT-PCR (FLU A&B, COVID) ARPGX2  LACTIC ACID, PLASMA  LACTIC ACID, PLASMA  PROTIME-INR  URINALYSIS, ROUTINE W REFLEX MICROSCOPIC  TROPONIN I (HIGH SENSITIVITY)    EKG EKG Interpretation  Date/Time:  Saturday August 15 2020 16:00:11 EDT Ventricular Rate:  88 PR Interval:  135 QRS Duration: 82 QT Interval:  383 QTC Calculation: 464 R Axis:   -7 Text Interpretation: Sinus rhythm Probable left atrial enlargement Probable left ventricular hypertrophy Confirmed by Kristine RoyalMessick, Eriyanna Kofoed [phone]([id number]) on 08/15/2020 4:14:43 PM   Radiology CT Head Wo Contrast  Result Date: 08/15/2020 CLINICAL DATA:  Delirium.  History of stroke.  Poor historian. EXAM: CT HEAD WITHOUT CONTRAST TECHNIQUE: Contiguous axial images were obtained from the base of the skull through the vertex without intravenous contrast. COMPARISON:  Prior brain MRI 03/21/2020. FINDINGS: Brain: Mild cerebral and cerebellar atrophy. There is a 5 mm focus of hyperdensity within the left pons. An acute parenchymal hemorrhage was present at this site on the prior head CT and brain MRI examinations of 03/21/2020 (measuring 10 mm at that time). Known chronic lacunar infarcts within the bilateral hemispheric cerebral white matter, basal ganglia, thalami and within the pons, some of which were better appreciated on the prior brain MRI of 03/21/2020. Background advanced patchy and ill-defined hypoattenuation within the cerebral white matter is nonspecific, but compatible with chronic small vessel ischemic disease. No demarcated cortical infarct. No extra-axial fluid collection. No evidence of intracranial mass. No midline shift.  Partially empty sella turcica. Vascular: No hyperdense vessel.  Atherosclerotic calcifications. Skull: Normal. Negative for fracture or focal lesion. Sinuses/Orbits:  Visualized orbits show no acute finding. No significant paranasal sinus disease. Other: Trace bilateral mastoid effusions. These results were called by telephone at the time of interpretation on 08/15/2020 at 6:40 pm to provider [facility] , who verbally acknowledged these results. IMPRESSION: 5 mm focus of hyperdensity within the left pons. A 10 mm acute parenchymal hemorrhage was present at this site on the prior head CT and brain MRI examinations of 03/21/2020. Thus, the finding at this site on today's examination may reflect an interval acute hemorrhage or dystrophic calcification from the prior hemorrhage. A brain MRI may be helpful for further characterization. Known chronic lacunar infarcts within the bilateral hemispheric cerebral white matter, basal ganglia, thalami and within the pons. Stable background severe cerebral white matter chronic small vessel ischemic disease. Generalized parenchymal atrophy. Trace bilateral mastoid effusions. Electronically Signed   By: Jackey Loge DO   On: 08/15/2020 18:48   DG Chest Port 1 View  Result Date: 08/15/2020 CLINICAL DATA:  Dyspnea EXAM: PORTABLE CHEST 1 VIEW COMPARISON:  March 21, 2020 FINDINGS: The heart size and mediastinal contours are within normal limits. Both lungs are clear. The visualized skeletal structures are unremarkable. IMPRESSION: No active disease. Electronically Signed   By: Sherian Rein M.D.   On: 08/15/2020 16:06    Procedures Procedures   Medications Ordered in ED Medications  0.9 %  sodium chloride infusion (has no administration in time range)  sodium chloride 0.9 % bolus 500 mL (0 mLs Intravenous Stopped 08/15/20 1656)    ED Course  I have reviewed the triage vital signs and the nursing notes.  Pertinent labs & imaging results that were available during  my care of the patient were reviewed by me and considered in my medical decision making (see chart for details).    MDM Rules/Calculators/A&P                          MDM  MSE complete  Brenda Riggs was evaluated in Emergency Department on 08/15/2020 for the symptoms described in the history of present illness. She was evaluated in the context of the global COVID-19 pandemic, which necessitated consideration that the patient might be at risk for infection with the SARS-CoV-2 virus that causes COVID-19. Institutional protocols and algorithms that pertain to the evaluation of patients at risk for COVID-19 are in a state of rapid change based on information released by regulatory bodies including the CDC and federal and state organizations. These policies and algorithms were followed during the patient's care in the ED.  Patient presents from her facility for evaluation of reported decrease MS and weakness. She is a very poor historian. Broad workup initiated.   1900 - Patient's CT imaging is suggestive of possible new parenchymal  (12mm) hemorrhage in the left pons.  Case discussed and images reviewed with Dr. Amada Jupiter covering for neurology.  He agrees with need for emergent MRI to evaluate CT findings from this evening.  If patient has imaging evidence of acute hemorrhage patient will require more aggressive treatment.  2322 - MRI images discussed with Dr. Selina Cooley covering for neurology.  She does not feel that the patient's clinical presentation is suggestive of acute hemorrhage into the left pons.  She agrees with plan for repeat CT imaging in 12 to 24 hours.  She does not feel that the patient needs to be transported to [facility] at this time.  Hospitalist service is aware of case and will evaluate for  admission.     Final Clinical Impression(s) / ED Diagnoses Final diagnoses:  Altered mental status, unspecified altered mental status type    Rx / DC Orders ED Discharge Orders     None       Wynetta Fines, MD 08/15/20 2340

## 2020-08-15 NOTE — ED Notes (Signed)
This RN spoke with patient's daughter, updated her on results and plan of care Per daughter, patient has had increased dysphagia at the nursing home and is not eating and drinking per her usual Patient has had issues with dysphagia in the past due to previous strokes but not to this extent

## 2020-08-15 NOTE — ED Triage Notes (Signed)
Patient here from [facility] reporting weakness. States that patient has stage 2 wound to bottom and right hip that they have been treating with antibiotics, possible sepsis.

## 2020-08-15 NOTE — ED Notes (Signed)
Called MRI and left voicemail

## 2020-08-16 ENCOUNTER — Observation Stay (HOSPITAL_COMMUNITY): Payer: Medicaid Other

## 2020-08-16 DIAGNOSIS — R4189 Other symptoms and signs involving cognitive functions and awareness: Secondary | ICD-10-CM | POA: Diagnosis not present

## 2020-08-16 DIAGNOSIS — R1310 Dysphagia, unspecified: Secondary | ICD-10-CM

## 2020-08-16 LAB — RENAL FUNCTION PANEL
Albumin: 2.4 g/dL — ABNORMAL LOW (ref 3.5–5.0)
Anion gap: 10 (ref 5–15)
BUN: 10 mg/dL (ref 8–23)
CO2: 29 mmol/L (ref 22–32)
Calcium: 8.4 mg/dL — ABNORMAL LOW (ref 8.9–10.3)
Chloride: 104 mmol/L (ref 98–111)
Creatinine, Ser: 0.44 mg/dL (ref 0.44–1.00)
GFR, Estimated: >60 mL/min (ref >60)
Glucose, Bld: 102 mg/dL — ABNORMAL HIGH (ref 70–99)
Phosphorus: 2.7 mg/dL (ref 2.5–4.6)
Potassium: 3.5 mmol/L (ref 3.5–5.1)
Sodium: 143 mmol/L (ref 135–145)

## 2020-08-16 LAB — FERRITIN: Ferritin: 185 ng/mL (ref 11–307)

## 2020-08-16 LAB — GLUCOSE, CAPILLARY
Glucose-Capillary: 90 mg/dL (ref 70–99)
Glucose-Capillary: 100 mg/dL — ABNORMAL HIGH (ref 70–99)
Glucose-Capillary: 92 mg/dL (ref 70–99)
Glucose-Capillary: 105 mg/dL — ABNORMAL HIGH (ref 70–99)
Glucose-Capillary: 119 mg/dL — ABNORMAL HIGH (ref 70–99)

## 2020-08-16 LAB — BASIC METABOLIC PANEL
Anion gap: 11 (ref 5–15)
BUN: 13 mg/dL (ref 8–23)
CO2: 29 mmol/L (ref 22–32)
Calcium: 8.2 mg/dL — ABNORMAL LOW (ref 8.9–10.3)
Chloride: 106 mmol/L (ref 98–111)
Creatinine, Ser: 0.51 mg/dL (ref 0.44–1.00)
GFR, Estimated: >60 mL/min (ref >60)
Glucose, Bld: 84 mg/dL (ref 70–99)
Potassium: 3.1 mmol/L — ABNORMAL LOW (ref 3.5–5.1)
Sodium: 146 mmol/L — ABNORMAL HIGH (ref 135–145)

## 2020-08-16 LAB — FOLATE: Folate: 19 ng/mL (ref >5.9)

## 2020-08-16 LAB — CBC
HCT: 27.7 % — ABNORMAL LOW (ref 36.0–46.0)
Hemoglobin: 8.6 g/dL — ABNORMAL LOW (ref 12.0–15.0)
MCH: 29.6 pg (ref 26.0–34.0)
MCHC: 31 g/dL (ref 30.0–36.0)
MCV: 95.2 fL (ref 80.0–100.0)
Platelets: 425 10*3/uL — ABNORMAL HIGH (ref 150–400)
RBC: 2.91 MIL/uL — ABNORMAL LOW (ref 3.87–5.11)
RDW: 13.3 % (ref 11.5–15.5)
WBC: 12.8 10*3/uL — ABNORMAL HIGH (ref 4.0–10.5)
nRBC: 0 % (ref 0.0–0.2)

## 2020-08-16 LAB — IRON AND TIBC
Iron: 16 ug/dL — ABNORMAL LOW (ref 28–170)
Saturation Ratios: 9 % — ABNORMAL LOW (ref 10.4–31.8)
TIBC: 186 ug/dL — ABNORMAL LOW (ref 250–450)
UIBC: 170 ug/dL

## 2020-08-16 LAB — RETICULOCYTES
Immature Retic Fract: 28.5 % — ABNORMAL HIGH (ref 2.3–15.9)
RBC.: 2.94 MIL/uL — ABNORMAL LOW (ref 3.87–5.11)
Retic Count, Absolute: 32.3 10*3/uL (ref 19.0–186.0)
Retic Ct Pct: 1.1 % (ref 0.4–3.1)

## 2020-08-16 LAB — VITAMIN B12: Vitamin B-12: 1219 pg/mL — ABNORMAL HIGH (ref 180–914)

## 2020-08-16 LAB — TSH: TSH: 1.204 u[IU]/mL (ref 0.350–4.500)

## 2020-08-16 LAB — MAGNESIUM
Magnesium: 1.6 mg/dL — ABNORMAL LOW (ref 1.7–2.4)
Magnesium: 2.1 mg/dL (ref 1.7–2.4)

## 2020-08-16 MED ORDER — POTASSIUM CHLORIDE IN NACL 20-0.45 MEQ/L-% IV SOLN
INTRAVENOUS | Status: DC
  Filled 2020-08-16 (×5): qty 1000

## 2020-08-16 MED ORDER — HYDROCODONE-ACETAMINOPHEN 5-325 MG PO TABS
1.0000 | ORAL_TABLET | Freq: Once | ORAL | Status: AC
  Administered 2020-08-16: 1 via ORAL
  Filled 2020-08-16: qty 1

## 2020-08-16 MED ORDER — MAGNESIUM SULFATE 2 GM/50ML IV SOLN
2.0000 g | Freq: Once | INTRAVENOUS | Status: AC
  Administered 2020-08-16: 2 g via INTRAVENOUS
  Filled 2020-08-16: qty 50

## 2020-08-16 MED ORDER — POTASSIUM CHLORIDE 10 MEQ/100ML IV SOLN
10.0000 meq | INTRAVENOUS | Status: AC
  Administered 2020-08-16 (×2): 10 meq via INTRAVENOUS
  Filled 2020-08-16 (×2): qty 100

## 2020-08-16 MED ORDER — LABETALOL HCL 5 MG/ML IV SOLN
10.0000 mg | INTRAVENOUS | Status: AC | PRN
  Administered 2020-08-16: 10 mg via INTRAVENOUS
  Filled 2020-08-16: qty 4

## 2020-08-16 NOTE — H&P (Signed)
History and Physical    [name] OEH:[id number] DOB: 1955/11/18 DOA: 08/15/2020  PCP: [name], MD   Patient coming from: SNF   Chief Complaint: Decreased LOC, dysphagia   HPI: Brenda Riggs is a 65 y.o. 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  At her baseline, patient was nonambulatory, has severe dysarthria, dysphagia, has had worsening dysphagia recently as well as decreased level of consciousness.  He is unable to contribute much to the history due to her baseline cognitive deficits and severe dysarthria.  ED Course: Upon arrival to the ED, patient is found to be afebrile, saturating well on room air, and with stable blood pressure.  EKG features sinus rhythm and chest x-ray negative for acute cardiopulmonary disease.  Head CT notable for a 5 mm focus of hyperdensity pons at the site of prior acute parenchymal hemorrhage in November.  Further assessed with MRI brain but the lesion indeterminate lobe possibly calcification or acute hemorrhage.  ED discussed the case with neurology who felt that radiographic findings are less likely to reflect an acute hemorrhage but agreed with repeat head CT in 12 to 24 hours.  ED work-up also notable for potassium 3.0, WBC 12,200, platelets 172,000, and hemoglobin 8.6, down from 11.4 in November.  Review of Systems:  ROS limited by patient's clinical condition.  Past Medical History:  Diagnosis Date  . Allergy   . Asthma   . Blood transfusion without reported diagnosis   . Chronic kidney disease   . COPD (chronic obstructive pulmonary disease) (HCC)   . Depression   . Diabetes mellitus without complication (HCC)   . Hyperlipidemia   . Hypertension   . Stroke [facility])     Past Surgical  History:  Procedure Laterality Date  . ABDOMINAL HYSTERECTOMY    . APPENDECTOMY    . CESAREAN SECTION    . TUBAL LIGATION      Social History:   reports that she has been smoking cigarettes. She has a 4.10 pack-year smoking history. She has never used smokeless tobacco. She reports that she does not drink alcohol and does not use drugs.  Allergies  Allergen Reactions  . Penicillins Other (See Comments)    Per caregiver. Unknown reaction Did it involve swelling of the face/tongue/throat, SOB, or low BP? Unknown Did it involve sudden or severe rash/hives, skin peeling, or any reaction on the inside of your mouth or nose? Unknown Did you need to seek medical attention at a hospital or doctor's office? Unknown When did it last happen?unknown If all above answers are "NO", may proceed with cephalosporin use.  . Thioridazine Hcl Other (See Comments)    Present in Epic; not noted on MAR    Family History  Problem Relation Age of Onset  . Heart disease Mother      Prior to Admission medications   Medication Sig Start Date End Date Taking? Authorizing Provider  acetaminophen (TYLENOL) 325 MG tablet Take 650 mg by mouth every 6 (six) hours as needed for headache (minor discomfort/fever up to 100 degrees orally).    Yes [provider]  albuterol (PROVENTIL HFA;VENTOLIN HFA) 108 (90 BASE) MCG/ACT inhaler Inhale 2 puffs into the lungs 4 (four) times daily as needed for wheezing or shortness of breath.    Yes [provider]  alum & mag hydroxide-simeth (MAALOX/MYLANTA) 200-200-20 MG/5ML suspension Take 30 mLs by mouth once as needed for indigestion or heartburn.   Yes [provider]  amLODipine (NORVASC) 5 MG tablet Take 1 tablet (5 mg total) by mouth daily. 03/26/20  Yes Layne BentonBiby, Sharon L, NP  atorvastatin (LIPITOR) 40 MG tablet Take 1 tablet (40 mg total) by mouth daily at 6 PM. 03/25/20  Yes Biby, Jani FilesSharon L, NP  beclomethasone (QVAR REDIHALER) 40 MCG/ACT  inhaler Inhale 2 puffs into the lungs 2 (two) times daily.   Yes [provider]  cholecalciferol (VITAMIN D3) 25 MCG (1000 UNIT) tablet Take 1,000 Units by mouth daily.   Yes [provider]  dimenhyDRINATE (DRAMAMINE) 50 MG tablet Take 50 mg by mouth every 4 (four) hours as needed (nausea/vomiting).   Yes [provider]  divalproex (DEPAKOTE ER) 250 MG 24 hr tablet Take 250 mg by mouth at bedtime.   Yes [provider]  fluPHENAZine (PROLIXIN) 5 MG tablet Take 5 mg by mouth at bedtime.    Yes [provider]  fluticasone (FLONASE) 50 MCG/ACT nasal spray Place 1 spray into both nostrils daily.   Yes [provider]  gabapentin (NEURONTIN) 100 MG capsule Take 200 mg by mouth 2 (two) times daily.   Yes [provider]  guaifenesin (ROBITUSSIN) 100 MG/5ML syrup Take 200 mg by mouth every 6 (six) hours as needed for cough.   Yes [provider]  HYDROcodone-acetaminophen (NORCO/VICODIN) 5-325 MG tablet Take 1 tablet by mouth in the morning and at bedtime.   Yes [provider]  loperamide (IMODIUM A-D) 2 MG tablet Take 2 mg by mouth 4 (four) times daily as needed for diarrhea or loose stools.    Yes [provider]  magnesium hydroxide (MILK OF MAGNESIA) 400 MG/5ML suspension Take 22.5 mLs by mouth every 8 (eight) hours as needed (constipation (if no relief in 8 hours give with 6 oz prune juice)).    Yes [provider]  metFORMIN (GLUCOPHAGE) 500 MG tablet Take 500 mg by mouth every 12 (twelve) hours.    Yes [provider]  metoprolol tartrate (LOPRESSOR) 50 MG tablet Take 1 tablet (50 mg total) by mouth 2 (two) times daily. 03/25/20  Yes Layne BentonBiby, Sharon L, NP  Nutritional Supplements (NUTRITIONAL SUPPLEMENT PO) Take 1 each by mouth 3 (three) times daily with meals.   Yes [provider]  Pollen Extracts (PROSTAT PO) Take 30 Units by mouth in the morning and at bedtime.   Yes [provider]  Sodium Phosphates (FLEET ENEMA RE) Place 1 enema rectally daily as needed (constipation not relieved by MOM and prune juice).   Yes [provider]  vitamin C (ASCORBIC ACID) 500 MG tablet Take 500 mg by mouth daily.   Yes [provider]    Physical Exam: Vitals:   08/15/20 2240 08/15/20 2300 08/15/20 2330 08/16/20 0000  BP: (!) 149/72 (!) 153/74 (!) 162/67 (!) 167/72  Pulse: 100 (!) 104 (!) 101 (!) 102  Resp: 15 14 16 13   Temp:      TempSrc:      SpO2: 99% 99% 99% 100%    Constitutional: NAD, calm  Eyes: PERTLA, lids and conjunctivae normal ENMT: Mucous membranes are dry. Posterior pharynx clear of any exudate or lesions.   Neck: supple, no masses  Respiratory: no wheezing, no crackles. No accessory muscle use.  Cardiovascular: S1 & S2 heard, regular rate and rhythm. No extremity edema.  Abdomen: No distension, no tenderness, soft. Bowel sounds active.  Musculoskeletal: no clubbing / cyanosis. No joint deformity upper and lower extremities.   Skin: buttock ulceration. Warm, dry, well-perfused. Neurologic: No facial asymmetry, severe dysarthria. Sensation intact. Moving all extrmities.  Psychiatric: Alert and oriented to person. Calm and cooperative.    Labs and Imaging on Admission: I have personally reviewed following labs and imaging studies  CBC: Recent Labs  Lab 08/15/20 1608  WBC 12.2*  NEUTROABS 8.8*  HGB 8.6*  HCT 27.3*  MCV 95.8  PLT 422*   Basic Metabolic Panel: Recent Labs  Lab 08/15/20 1608  NA 147*  K 3.0*  CL 108  CO2 31  GLUCOSE 101*  BUN 20  CREATININE 0.44  CALCIUM 8.3*   GFR: CrCl cannot be calculated (Unknown ideal weight.). Liver Function Tests: Recent Labs  Lab 08/15/20 1608  AST 15  ALT 12  ALKPHOS 51  BILITOT 0.4  PROT 6.6  ALBUMIN 2.5*   No results for input(s): LIPASE, AMYLASE in the last 168 hours. No results for input(s): AMMONIA in the last 168 hours. Coagulation Profile: Recent Labs   Lab 08/15/20 1608  INR 1.1   Cardiac Enzymes: No results for input(s): CKTOTAL, CKMB, CKMBINDEX, TROPONINI in the last 168 hours. BNP (last 3 results) No results for input(s): PROBNP in the last 8760 hours. HbA1C: No results for input(s): HGBA1C in the last 72 hours. CBG: No results for input(s): GLUCAP in the last 168 hours. Lipid Profile: No results for input(s): CHOL, HDL, LDLCALC, TRIG, CHOLHDL, LDLDIRECT in the last 72 hours. Thyroid Function Tests: No results for input(s): TSH, T4TOTAL, FREET4, T3FREE, THYROIDAB in the last 72 hours. Anemia Panel: No results for input(s): VITAMINB12, FOLATE, FERRITIN, TIBC, IRON, RETICCTPCT in the last 72 hours. Urine analysis:    Component Value Date/Time   COLORURINE YELLOW 03/22/2020 1006   APPEARANCEUR CLEAR 03/22/2020 1006   LABSPEC 1.010 03/22/2020 1006   PHURINE 7.0 03/22/2020 1006   GLUCOSEU NEGATIVE 03/22/2020 1006   HGBUR NEGATIVE 03/22/2020 1006   HGBUR trace-intact 09/10/2009 1115   BILIRUBINUR NEGATIVE 03/22/2020 1006   KETONESUR NEGATIVE 03/22/2020 1006   PROTEINUR NEGATIVE 03/22/2020 1006   UROBILINOGEN 0.2 10/22/2009 2021   NITRITE NEGATIVE 03/22/2020 1006   LEUKOCYTESUR NEGATIVE 03/22/2020 1006   Sepsis Labs: (procalcitonin:4,lacticidven:4) ) Recent Results (from the past 240 hour(s))  Culture, blood (routine x 2)     Status: None (Preliminary result)   Collection Time: 08/15/20  4:08 PM   Specimen: BLOOD LEFT HAND  Result Value Ref Range Status   Specimen Description BLOOD LEFT HAND  Final   Special Requests   Final    BOTTLES DRAWN AEROBIC AND ANAEROBIC Blood Culture adequate volume Performed at [facility], [address], [place], [place] [address]    Culture PENDING  Incomplete   Report Status PENDING  Incomplete  Resp Panel by RT-PCR (Flu A&B, Covid) Nasopharyngeal Swab     Status: None   Collection Time: 08/15/20  4:09 PM   Specimen: Nasopharyngeal Swab; Nasopharyngeal(NP) swabs in  vial transport medium  Result Value Ref Range Status   SARS Coronavirus 2 by RT PCR NEGATIVE NEGATIVE Final    Comment: (NOTE) SARS-CoV-2 target nucleic acids are NOT DETECTED.  The SARS-CoV-2 RNA is generally detectable in upper respiratory specimens during the acute phase of infection. The lowest concentration of SARS-CoV-2 viral copies this assay can detect is 138 copies/mL. A negative result does not preclude SARS-Cov-2 infection and should not  be used as the sole basis for treatment or other patient management decisions. A negative result may occur with  improper specimen collection/handling, submission of specimen other than nasopharyngeal swab, presence of viral mutation(s) within the areas targeted by this assay, and inadequate number of viral copies(<138 copies/mL). A negative result must be combined with clinical observations, patient history, and epidemiological information. The expected result is Negative.  Fact Sheet for Patients:  BloggerCourse.com  Fact Sheet for Healthcare Providers:  SeriousBroker.it  This test is no t yet approved or cleared by the [place] FDA and  has been authorized for detection and/or diagnosis of SARS-CoV-2 by FDA under an Emergency Use Authorization (EUA). This EUA will remain  in effect (meaning this test can be used) for the duration of the COVID-19 declaration under Section 564(b)(1) of the Act, 21 U.S.C.section 360bbb-3(b)(1), unless the authorization is terminated  or revoked sooner.       Influenza A by PCR NEGATIVE NEGATIVE Final   Influenza B by PCR NEGATIVE NEGATIVE Final    Comment: (NOTE) The Xpert Xpress SARS-CoV-2/FLU/RSV plus assay is intended as an aid in the diagnosis of influenza from Nasopharyngeal swab specimens and should not be used as a sole basis for treatment. Nasal washings and aspirates are unacceptable for Xpert Xpress SARS-CoV-2/FLU/RSV testing.  Fact  Sheet for Patients: BloggerCourse.com  Fact Sheet for Healthcare Providers: SeriousBroker.it  This test is not yet approved or cleared by the [place] FDA and has been authorized for detection and/or diagnosis of SARS-CoV-2 by FDA under an Emergency Use Authorization (EUA). This EUA will remain in effect (meaning this test can be used) for the duration of the COVID-19 declaration under Section 564(b)(1) of the Act, 21 U.S.C. section 360bbb-3(b)(1), unless the authorization is terminated or revoked.  Performed at [facility], [address], [place], [place] [address]   Culture, blood (routine x 2)     Status: None (Preliminary result)   Collection Time: 08/15/20  4:10 PM   Specimen: BLOOD RIGHT ARM  Result Value Ref Range Status   Specimen Description   Final    BLOOD RIGHT ARM Performed at [facility], [address], [place], [place] [address]    Special Requests   Final    BOTTLES DRAWN AEROBIC AND ANAEROBIC Blood Culture results may not be optimal due to an inadequate volume of blood received in culture bottles Performed at [facility], [address] [place], [place] [address]    Culture PENDING  Incomplete   Report Status PENDING  Incomplete     Radiological Exams on Admission: CT Head Wo Contrast  Result Date: 08/15/2020 CLINICAL DATA:  Delirium.  History of stroke.  Poor historian. EXAM: CT HEAD WITHOUT CONTRAST TECHNIQUE: Contiguous axial images were obtained from the base of the skull through the vertex without intravenous contrast. COMPARISON:  Prior brain MRI 03/21/2020. FINDINGS: Brain: Mild cerebral and cerebellar atrophy. There is a 5 mm focus of hyperdensity within the left pons. An acute parenchymal hemorrhage was present at this site on the prior head CT and brain MRI examinations of 03/21/2020 (measuring 10 mm at that time). Known chronic lacunar infarcts within  the bilateral hemispheric cerebral white matter, basal ganglia, thalami and within the pons, some of which were better appreciated on the prior brain MRI of 03/21/2020. Background advanced patchy and ill-defined hypoattenuation within the cerebral white matter is nonspecific, but compatible with chronic small vessel ischemic disease. No demarcated cortical infarct. No  extra-axial fluid collection. No evidence of intracranial mass. No midline shift. Partially empty sella turcica. Vascular: No hyperdense vessel.  Atherosclerotic calcifications. Skull: Normal. Negative for fracture or focal lesion. Sinuses/Orbits: Visualized orbits show no acute finding. No significant paranasal sinus disease. Other: Trace bilateral mastoid effusions. These results were called by telephone at the time of interpretation on 08/15/2020 at 6:40 pm to provider [facility] , who verbally acknowledged these results. IMPRESSION: 5 mm focus of hyperdensity within the left pons. A 10 mm acute parenchymal hemorrhage was present at this site on the prior head CT and brain MRI examinations of 03/21/2020. Thus, the finding at this site on today's examination may reflect an interval acute hemorrhage or dystrophic calcification from the prior hemorrhage. A brain MRI may be helpful for further characterization. Known chronic lacunar infarcts within the bilateral hemispheric cerebral white matter, basal ganglia, thalami and within the pons. Stable background severe cerebral white matter chronic small vessel ischemic disease. Generalized parenchymal atrophy. Trace bilateral mastoid effusions. Electronically Signed   By: Jackey Loge DO   On: 08/15/2020 18:48   MR BRAIN WO CONTRAST  Result Date: 08/15/2020 CLINICAL DATA:  Follow-up examination for acute delirium, possible hemorrhage on prior CT. EXAM: MRI HEAD WITHOUT CONTRAST TECHNIQUE: Multiplanar, multiecho pulse sequences of the brain and surrounding structures were obtained without intravenous  contrast. COMPARISON:  Prior CT from earlier the same day as well as earlier studies. FINDINGS: Brain: Examination technically limited by motion artifact and the patient's inability to tolerate the full length of the study. Moderately advanced cerebral and cerebellar atrophy. Patchy and confluent T2/signal abnormality involving the periventricular and deep white matter both cerebral hemispheres as well as the brainstem, consistent with chronic microvascular ischemic disease, fairly advanced in nature. Multiple scattered superimposed remote lacunar infarcts noted about the hemispheric cerebral white matter, basal ganglia, thalami, and pons. Previously noted subcentimeter hyperdense focus at the left ventral pons demonstrates strongly hypointense T2 signal intensity, with isointense precontrast T1 signal intensity. This area blooms and is obscured on gradient echo sequence. No significant surrounding edema is seen within this area, although evaluation somewhat limited given the lack of a FLAIR sequence. Finding remains somewhat indeterminate by MRI, although is favored to reflect the late subacute to chronic residua of prior bleed seen at this location. No other abnormal foci of restricted diffusion to suggest acute or subacute ischemia. Gray-white matter differentiation otherwise maintained. No other areas of acute intracranial hemorrhage. Again seen are innumerable scattered chronic micro hemorrhages clustered about the brainstem and deep gray nuclei, most likely related chronic poorly controlled hypertension. No mass lesion, midline shift or mass effect. Mild ventricular prominence related to global parenchymal volume loss without hydrocephalus. No extra-axial fluid collection. Partially empty sella noted. Vascular: Major intracranial vascular flow voids are maintained. Skull and upper cervical spine: Craniocervical junction within normal limits. Bone marrow signal intensity normal. No scalp soft tissue  abnormality. Sinuses/Orbits: Globes and orbital soft tissues within normal limits. Paranasal sinuses are largely clear. No significant mastoid effusion. Other: None. IMPRESSION: 1. Technically limited exam due to motion artifact and the patient's inability to tolerate the full length of the study. 2. Previously noted subcentimeter hyperdense focus at the left ventral pons remains indeterminate by MRI, although is favored to reflect the late subacute to chronic residua of prior bleed seen at this location. Given that the exact age determination remains somewhat difficult, a short interval follow-up CT in 12-24 hours for direct comparison purposes would likely be helpful. 3. No  other acute intracranial abnormality. 4. Moderately advanced cerebral and cerebellar atrophy with chronic microvascular ischemic disease. 5. Innumerable scattered chronic micro hemorrhages clustered about the brainstem and deep gray nuclei, most consistent with chronic poorly controlled hypertension. Electronically Signed   By: Rise Mu M.D.   On: 08/15/2020 22:55   DG Chest Port 1 View  Result Date: 08/15/2020 CLINICAL DATA:  Dyspnea EXAM: PORTABLE CHEST 1 VIEW COMPARISON:  March 21, 2020 FINDINGS: The heart size and mediastinal contours are within normal limits. Both lungs are clear. The visualized skeletal structures are unremarkable. IMPRESSION: No active disease. Electronically Signed   By: Sherian Rein M.D.   On: 08/15/2020 16:06   DG Abd Portable 1 View  Result Date: 08/15/2020 CLINICAL DATA:  Pre MRI evaluation EXAM: PORTABLE ABDOMEN - 1 VIEW COMPARISON:  None. FINDINGS: Scattered large and small bowel gas is noted. No radiopaque foreign body is seen. No implanted device is seen. Degenerative changes of lumbar spine are noted. IMPRESSION: No acute abnormality noted.  No metal devices are seen. Electronically Signed   By: Alcide Clever M.D.   On: 08/15/2020 20:17    EKG: Independently reviewed. Sinus rhythm.    Assessment/Plan   1. Dysphagia; decreased LOC; indeterminate left pons lesion  - Patient with hx of CVAs and chronic dysphagia presents with decreased LOC and worsened dysphagia and is found to have left pons lesion at site of prior hemorrhage that could reflect calcification or new hemorrhage  - Radiology and neurology recommend repeat CT head in 12-24 hours, will also consult SLP, follow-up pending UA, check TSH, control BP    2. Schizophrenia; dementia  - Calm and cooperative in ED  - Continue fluphenazine and Depakote, delirium precautions   3. Type II DM  - Check CBGs and use a low-intensity SSI for now  4. Hypertension  - Use IV labetalol as needed pending SLP eval    5. Anemia  - Hgb is 8.6, down from 11.4 in November without overt bleeding  - Check anemia panel, trend    6. Hypokalemia  - Serum potassium is 3.0 in ED  - Replace, repeat chem panel   7. OSA; asthma  - No cough or wheezing on admission  - Continue CPAP qHS, ICS, as needed albuterol     DVT prophylaxis: SCDs  Code Status: Full  Level of Care: Level of care: Telemetry Family Communication: Daughter updated from ED  Disposition Plan:  Patient is from: SNF  Anticipated d/c is to: SNF  Anticipated d/c date is: 4/17 or 08/17/20 Patient currently: Pending repeat CT head, SLP eval  Consults called: None Admission status: Observation     Briscoe Deutscher, MD [facility]  08/16/2020, 12:09 AM

## 2020-08-16 NOTE — Evaluation (Signed)
Clinical/Bedside Swallow Evaluation Patient Details  Name: Brenda Riggs MRN: [id number] Date of Birth: [birth year]  Today's Date: 08/16/2020 Time: SLP Start Time (ACUTE ONLY): 1400 SLP Stop Time (ACUTE ONLY): 1418 SLP Time Calculation (min) (ACUTE ONLY): 18 min  Past Medical History:  Past Medical History:  Diagnosis Date  . Allergy   . Asthma   . Blood transfusion without reported diagnosis   . Chronic kidney disease   . COPD (chronic obstructive pulmonary disease) (HCC)   . Depression   . Diabetes mellitus without complication (HCC)   . Hyperlipidemia   . Hypertension   . Stroke [facility])    Past Surgical History:  Past Surgical History:  Procedure Laterality Date  . ABDOMINAL HYSTERECTOMY    . APPENDECTOMY    . CESAREAN SECTION    . TUBAL LIGATION     HPI:  Brenda Riggs is a 65 y.o. 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  CXR was unremarkable. Pt was last seen for a BSE on 03/22/21 with recommendations for Dysphagia 1 (puree) solids and thin liquids.   Assessment / Plan / Recommendation Clinical Impression  Pt was seen for a bedside swallow evaluation and she presents with a cognitive-based dysphagia.  Daughter was present at bedside and reported that pt is on a Dysphagia 1 (puree) and thin liquid diet at baseline at skilled nursing facility.  She additionally stated that staff at the facility informed her that they had tried thickening the pt's liquids prior to her admission.  Oral mechanism examination was remarkable for generalized oral weakness, and L lingual deviation upon protrusion.  Pt consumed trials of thin liquid via straw and puree.  She exhibited good labial closure around the straw and spoon, and she was able to  independently mobilize liquid to her oral cavity via straw.  Pt exhibited prolonged oral transit with all PO trials (puree > liquid) and suspect delayed swallow initiation.  Audible swallows were intermittently observed and multiple swallows were noted in 1/3 trials of puree; however, no overt s/sx of aspiration were observed.  Pt benefited from verbal cueing and use of a dry spoon to help initiate a swallow when oral holding occurred with puree.  Recommend diet initiation of Dysphagia 1 (puree) solids and thin liquids with medication administered crushed in puree and full supervision/assistance during meals.  SLP will briefly f/u to monitor diet tolerance.  SLP Visit Diagnosis: Dysphagia, unspecified (R13.10)    Aspiration Risk  Mild aspiration risk    Diet Recommendation Dysphagia 1 (Puree);Thin liquid   Liquid Administration via: Cup;Straw Medication Administration: Crushed with puree Supervision: Staff to assist with self feeding;Full supervision/cueing for compensatory strategies Compensations: Minimize environmental distractions;Slow rate;Small sips/bites Postural Changes: Seated upright at 90 degrees    Other  Recommendations Oral Care Recommendations: Oral care BID;Staff/trained caregiver to provide oral care   Follow up Recommendations Skilled Nursing facility      Frequency and Duration min 2x/week  2 weeks       Prognosis Prognosis for Safe Diet Advancement: Guarded Barriers to Reach Goals: Cognitive deficits      Swallow Study   General HPI: Brenda Riggs is a 65 y.o. female with medical history significant for schizophrenia, dementia, COPD, OSA, type 2 diabetes mellitus, history of CVA, and left pontine hemorrhage in November  2021, now presenting to the emergency department for evaluation of decreased level of consciousness and worsening dysphagia.  CXR was unremarkable. Pt was last seen for a BSE on 03/22/21 with recommendations for Dysphagia 1 (puree) solids and thin  liquids. Type of Study: Bedside Swallow Evaluation Previous Swallow Assessment: See HPI Diet Prior to this Study: NPO Temperature Spikes Noted: Yes Respiratory Status: Room air History of Recent Intubation: No Behavior/Cognition: Alert;Cooperative;Impulsive;Confused;Pleasant mood Oral Cavity Assessment: Within Functional Limits Oral Care Completed by SLP: No Oral Cavity - Dentition: Adequate natural dentition Vision: Functional for self-feeding Self-Feeding Abilities: Needs assist Patient Positioning: Upright in bed Baseline Vocal Quality: Normal Volitional Cough: Cognitively unable to elicit Volitional Swallow: Unable to elicit    Oral/Motor/Sensory Function Overall Oral Motor/Sensory Function: Generalized oral weakness Facial ROM: Within Functional Limits Facial Symmetry: Within Functional Limits Lingual ROM: Reduced right;Reduced left Lingual Symmetry: Abnormal symmetry left Lingual Strength: Reduced   Ice Chips Ice chips: Not tested   Thin Liquid Thin Liquid: Impaired Oral Phase Functional Implications: Prolonged oral transit Pharyngeal  Phase Impairments: Suspected delayed Swallow    Nectar Thick Nectar Thick Liquid: Not tested   Honey Thick Honey Thick Liquid: Not tested   Puree Puree: Impaired Presentation: Spoon Oral Phase Functional Implications: Prolonged oral transit;Oral holding Pharyngeal Phase Impairments: Suspected delayed Swallow;Multiple swallows   Solid     Solid: Not tested     Villa Herb M.S., CCC-SLP Acute Rehabilitation Services Office: [phone]  Shanon Rosser Ismerai Bin 08/16/2020,2:29 PM

## 2020-08-16 NOTE — Progress Notes (Signed)
Patient does not want to wear CPAP for tonight. RT asked RN if anything changes with her breathing to let this RT know

## 2020-08-16 NOTE — Progress Notes (Signed)
   08/16/20 0319  Assess: MEWS Score  Temp 97.9 F (36.6 C)  BP (!) 144/65  Pulse Rate 90  Resp 17  Level of Consciousness Alert  SpO2 99 %  O2 Device Room Air  Assess: MEWS Score  MEWS Temp 0  MEWS Systolic 0  MEWS Pulse 0  MEWS RR 0  MEWS LOC 0  MEWS Score 0  MEWS Score Color Green  Assess: if the MEWS score is Yellow or Red  Were vital signs taken at a resting state? Yes  Focused Assessment No change from prior assessment  Early Detection of Sepsis Score *See Row Information* Medium  MEWS guidelines implemented *See Row Information* No, previously yellow, continue vital signs every 4 hours  Treat  MEWS Interventions Other (Comment) (continue monitor)  Pain Scale 0-10  Pain Score Asleep  Take Vital Signs  Increase Vital Sign Frequency  Yellow: Q 2hr X 2 then Q 4hr X 2, if remains yellow, continue Q 4hrs  Document  Patient Outcome Stabilized after interventions  Progress note created (see row info) Yes  Patient is now green MEWS, BP and HR improved after intervention

## 2020-08-16 NOTE — Progress Notes (Signed)
   08/16/20 0106  Assess: MEWS Score  Temp 99.1 F (37.3 C)  BP (!) 173/91  Pulse Rate (!) 110  Level of Consciousness Alert  SpO2 100 %  Assess: MEWS Score  MEWS Temp 0  MEWS Systolic 0  MEWS Pulse 1  MEWS RR 1  MEWS LOC 0  MEWS Score 2  MEWS Score Color Yellow  Assess: if the MEWS score is Yellow or Red  Were vital signs taken at a resting state? Yes  Focused Assessment No change from prior assessment  Early Detection of Sepsis Score *See Row Information* Medium  MEWS guidelines implemented *See Row Information* Yes  Treat  MEWS Interventions Administered scheduled meds/treatments;Administered prn meds/treatments  Pain Scale PAINAD  Take Vital Signs  Increase Vital Sign Frequency  Yellow: Q 2hr X 2 then Q 4hr X 2, if remains yellow, continue Q 4hrs  Escalate  MEWS: Escalate Yellow: discuss with charge nurse/RN and consider discussing with provider and RRT  Notify: Charge Nurse/RN  Name of Charge Nurse/RN Notified Randa Evens  Date Charge Nurse/RN Notified 08/16/20  Time Charge Nurse/RN Notified 0110  Patient was yellow in the ED, gave labetalol IV for elevated BP and HR, patient asymptomatic and resting, will recheck vitals per yellow MEWS

## 2020-08-16 NOTE — Plan of Care (Signed)
  Problem: Safety: Goal: Ability to remain free from injury will improve Outcome: Progressing   

## 2020-08-16 NOTE — Progress Notes (Signed)
PROGRESS NOTE    Brenda Riggs  ZOX:[id number] DOB: 01/19/1956 DOA: 08/15/2020 PCP: Jackie Plumsei-Bonsu, George, MD  Outpatient Specialists:   Brief Narrative:  As per H&P done on admission: "Brenda BannisterGloria J Riggs is a 65 y.o. 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  At her baseline, patient was nonambulatory, has severe dysarthria, dysphagia, has had worsening dysphagia recently as well as decreased level of consciousness.  He is unable to contribute much to the history due to her baseline cognitive deficits and severe dysarthria.  ED Course: Upon arrival to the ED, patient is found to be afebrile, saturating well on room air, and with stable blood pressure.  EKG features sinus rhythm and chest x-ray negative for acute cardiopulmonary disease.  Head CT notable for a 5 mm focus of hyperdensity pons at the site of prior acute parenchymal hemorrhage in November.  Further assessed with MRI brain but the lesion indeterminate lobe possibly calcification or acute hemorrhage.  ED discussed the case with neurology who felt that radiographic findings are less likely to reflect an acute hemorrhage but agreed with repeat head CT in 12 to 24 hours.  ED work-up also notable for potassium 3.0, WBC 12,200, platelets 172,000, and hemoglobin 8.6, down from 11.4 in November".  08/16/2020: Repeat CT head has not shown any acute bleed.  UA has not been visualized.  Patient looks dehydrated.  Input revealed sodium of 143, potassium of 3.5, magnesium of 2.1.  Patient is currently on half-normal saline +20 M EQ of KCl at 100 cc/h.  We will follow UA when available as well as urine culture i.e. if indicated.  Medications reviewed and optimized.  Further management depend on hospital  course.  Assessment & Plan:   Principal Problem:   Decreased level of consciousness Active Problems:   Type II diabetes mellitus (HCC)   Paranoid schizophrenia (HCC)   Essential hypertension, benign   Asthma   OSA (obstructive sleep apnea)   Pressure injury of skin   History of hemorrhagic cerebrovascular accident (CVA) with residual deficit   Hypokalemia   Dysphagia  1. Dysphagia; decreased LOC (acute metabolic encephalopathy); indeterminate left pons lesion  - Patient with hx of CVAs and chronic dysphagia presents with decreased LOC and worsened dysphagia and is found to have left pons lesion at site of prior hemorrhage that could reflect calcification or new hemorrhage  - Repeat CT head is negative for bleed. -TSH is within normal range. -Patient is dehydrated. -Cannot rule out other causes of toxic encephalopathy. -Pursue urinalysis and urine culture if indicated. -Optimize patient's medication, especially mind altering medications.    2. Schizophrenia; dementia  - Calm and cooperative in ED  - Continue fluphenazine and Depakote, delirium precautions  08/16/2020: Hold fluphenazine.  Have a low threshold to consult psychiatry.  3. Type II DM  - Check CBGs and use a low-intensity SSI for now  4. Hypertension  - Use IV labetalol as needed pending SLP eval   08/16/2020: Goal blood pressure should be less than 130/80 mmHg.  5. Anemia  - Hgb is 8.6, down from 11.4 in November without overt bleeding  - Check anemia panel, trend   08/16/2020: Anemia work-up is indicative of anemia of chronic inflammation.  6. Hypokalemia  - Serum potassium is 3.0 in ED  - Replace, repeat chem  panel  08/16/2020: Repeat renal panel.  Continue to replete potassium.   7. OSA; asthma  - No cough or wheezing on admission  - Continue CPAP qHS, ICS, as needed albuterol    8.  Dehydration: -Continue IV fluids. -Continue to monitor volume status and electrolytes.   DVT prophylaxis:  SCD Code Status: Full code Family Communication:  Disposition Plan: This will depend on hospital course.  Likely discharge back to skilled nursing facility.   Consultants:   None.  Procedures:   None  Antimicrobials:   None   Subjective: No significant history from patient.  Objective: Vitals:   08/16/20 0319 08/16/20 0505 08/16/20 0823 08/16/20 0904  BP: (!) 144/65 (!) 152/71  (!) 164/78  Pulse: 90 (!) 101  (!) 103  Resp: 17 20  20   Temp: 97.9 F (36.6 C) 98.2 F (36.8 C)  99.2 F (37.3 C)  TempSrc:  Oral    SpO2: 99% 100% 97% 99%    Intake/Output Summary (Last 24 hours) at 08/16/2020 0931 Last data filed at 08/16/2020 0505 Gross per 24 hour  Intake 339.98 ml  Output --  Net 339.98 ml   There were no vitals filed for this visit.  Examination:  General exam: Patient is not in any distress.  Patient only obeys simple commands.  Contracture of left lower extremity.    Respiratory system: Clear to auscultation.  Cardiovascular system: S1 & S2 heard. Gastrointestinal system: Abdomen is nondistended, soft and nontender. No organomegaly or masses felt. Normal bowel sounds heard. Central nervous system: Awake.  Obeys simple commands.  Moves extremities except a contracted left lower extremity.   Extremities: No leg edema  Data Reviewed: I have personally reviewed following labs and imaging studies  CBC: Recent Labs  Lab 08/15/20 1608 08/16/20 0459  WBC 12.2* 12.8*  NEUTROABS 8.8*  --   HGB 8.6* 8.6*  HCT 27.3* 27.7*  MCV 95.8 95.2  PLT 422* 425*   Basic Metabolic Panel: Recent Labs  Lab 08/15/20 1608 08/16/20 0459  NA 147* 146*  K 3.0* 3.1*  CL 108 106  CO2 31 29  GLUCOSE 101* 84  BUN 20 13  CREATININE 0.44 0.51  CALCIUM 8.3* 8.2*  MG  --  1.6*   GFR: CrCl cannot be calculated (Unknown ideal weight.). Liver Function Tests: Recent Labs  Lab 08/15/20 1608  AST 15  ALT 12  ALKPHOS 51  BILITOT 0.4  PROT 6.6  ALBUMIN 2.5*   No results  for input(s): LIPASE, AMYLASE in the last 168 hours. No results for input(s): AMMONIA in the last 168 hours. Coagulation Profile: Recent Labs  Lab 08/15/20 1608  INR 1.1   Cardiac Enzymes: No results for input(s): CKTOTAL, CKMB, CKMBINDEX, TROPONINI in the last 168 hours. BNP (last 3 results) No results for input(s): PROBNP in the last 8760 hours. HbA1C: No results for input(s): HGBA1C in the last 72 hours. CBG: Recent Labs  Lab 08/16/20 0114 08/16/20 0739  GLUCAP 90 100*   Lipid Profile: No results for input(s): CHOL, HDL, LDLCALC, TRIG, CHOLHDL, LDLDIRECT in the last 72 hours. Thyroid Function Tests: Recent Labs    08/16/20 0459  TSH 1.204   Anemia Panel: Recent Labs    08/16/20 0459  VITAMINB12 1,219*  FOLATE 19.0  FERRITIN 185  TIBC 186*  IRON 16*  RETICCTPCT 1.1   Urine analysis:    Component Value Date/Time   COLORURINE YELLOW 03/22/2020 1006   APPEARANCEUR CLEAR 03/22/2020 1006   LABSPEC 1.010 03/22/2020 1006  PHURINE 7.0 03/22/2020 1006   GLUCOSEU NEGATIVE 03/22/2020 1006   HGBUR NEGATIVE 03/22/2020 1006   HGBUR trace-intact 09/10/2009 1115   BILIRUBINUR NEGATIVE 03/22/2020 1006   KETONESUR NEGATIVE 03/22/2020 1006   PROTEINUR NEGATIVE 03/22/2020 1006   UROBILINOGEN 0.2 10/22/2009 2021   NITRITE NEGATIVE 03/22/2020 1006   LEUKOCYTESUR NEGATIVE 03/22/2020 1006   Sepsis Labs: (procalcitonin:4,lacticidven:4)  ) Recent Results (from the past 240 hour(s))  Culture, blood (routine x 2)     Status: None (Preliminary result)   Collection Time: 08/15/20  4:08 PM   Specimen: BLOOD LEFT HAND  Result Value Ref Range Status   Specimen Description BLOOD LEFT HAND  Final   Special Requests   Final    BOTTLES DRAWN AEROBIC AND ANAEROBIC Blood Culture adequate volume Performed at [facility], [address], [place], [place] [address]    Culture PENDING  Incomplete   Report Status PENDING  Incomplete  Resp Panel by RT-PCR (Flu A&B,  Covid) Nasopharyngeal Swab     Status: None   Collection Time: 08/15/20  4:09 PM   Specimen: Nasopharyngeal Swab; Nasopharyngeal(NP) swabs in vial transport medium  Result Value Ref Range Status   SARS Coronavirus 2 by RT PCR NEGATIVE NEGATIVE Final    Comment: (NOTE) SARS-CoV-2 target nucleic acids are NOT DETECTED.  The SARS-CoV-2 RNA is generally detectable in upper respiratory specimens during the acute phase of infection. The lowest concentration of SARS-CoV-2 viral copies this assay can detect is 138 copies/mL. A negative result does not preclude SARS-Cov-2 infection and should not be used as the sole basis for treatment or other patient management decisions. A negative result may occur with  improper specimen collection/handling, submission of specimen other than nasopharyngeal swab, presence of viral mutation(s) within the areas targeted by this assay, and inadequate number of viral copies(<138 copies/mL). A negative result must be combined with clinical observations, patient history, and epidemiological information. The expected result is Negative.  Fact Sheet for Patients:  BloggerCourse.com  Fact Sheet for Healthcare Providers:  SeriousBroker.it  This test is no t yet approved or cleared by the [place] FDA and  has been authorized for detection and/or diagnosis of SARS-CoV-2 by FDA under an Emergency Use Authorization (EUA). This EUA will remain  in effect (meaning this test can be used) for the duration of the COVID-19 declaration under Section 564(b)(1) of the Act, 21 U.S.C.section 360bbb-3(b)(1), unless the authorization is terminated  or revoked sooner.       Influenza A by PCR NEGATIVE NEGATIVE Final   Influenza B by PCR NEGATIVE NEGATIVE Final    Comment: (NOTE) The Xpert Xpress SARS-CoV-2/FLU/RSV plus assay is intended as an aid in the diagnosis of influenza from Nasopharyngeal swab specimens and should  not be used as a sole basis for treatment. Nasal washings and aspirates are unacceptable for Xpert Xpress SARS-CoV-2/FLU/RSV testing.  Fact Sheet for Patients: BloggerCourse.com  Fact Sheet for Healthcare Providers: SeriousBroker.it  This test is not yet approved or cleared by the [place] FDA and has been authorized for detection and/or diagnosis of SARS-CoV-2 by FDA under an Emergency Use Authorization (EUA). This EUA will remain in effect (meaning this test can be used) for the duration of the COVID-19 declaration under Section 564(b)(1) of the Act, 21 U.S.C. section 360bbb-3(b)(1), unless the authorization is terminated or revoked.  Performed at [facility], [address], [place], [place] [address]   Culture, blood (routine x 2)     Status: None (  Preliminary result)   Collection Time: 08/15/20  4:10 PM   Specimen: BLOOD RIGHT ARM  Result Value Ref Range Status   Specimen Description   Final    BLOOD RIGHT ARM Performed at [facility], [address] [place], [place] [address]    Special Requests   Final    BOTTLES DRAWN AEROBIC AND ANAEROBIC Blood Culture results may not be optimal due to an inadequate volume of blood received in culture bottles Performed at [facility], [address] [place], [place] [address]    Culture PENDING  Incomplete   Report Status PENDING  Incomplete         Radiology Studies: CT Head Wo Contrast  Result Date: 08/15/2020 CLINICAL DATA:  Delirium.  History of stroke.  Poor historian. EXAM: CT HEAD WITHOUT CONTRAST TECHNIQUE: Contiguous axial images were obtained from the base of the skull through the vertex without intravenous contrast. COMPARISON:  Prior brain MRI 03/21/2020. FINDINGS: Brain: Mild cerebral and cerebellar atrophy. There is a 5 mm focus of hyperdensity within the left pons. An acute parenchymal hemorrhage was present at  this site on the prior head CT and brain MRI examinations of 03/21/2020 (measuring 10 mm at that time). Known chronic lacunar infarcts within the bilateral hemispheric cerebral white matter, basal ganglia, thalami and within the pons, some of which were better appreciated on the prior brain MRI of 03/21/2020. Background advanced patchy and ill-defined hypoattenuation within the cerebral white matter is nonspecific, but compatible with chronic small vessel ischemic disease. No demarcated cortical infarct. No extra-axial fluid collection. No evidence of intracranial mass. No midline shift. Partially empty sella turcica. Vascular: No hyperdense vessel.  Atherosclerotic calcifications. Skull: Normal. Negative for fracture or focal lesion. Sinuses/Orbits: Visualized orbits show no acute finding. No significant paranasal sinus disease. Other: Trace bilateral mastoid effusions. These results were called by telephone at the time of interpretation on 08/15/2020 at 6:40 pm to provider [facility] , who verbally acknowledged these results. IMPRESSION: 5 mm focus of hyperdensity within the left pons. A 10 mm acute parenchymal hemorrhage was present at this site on the prior head CT and brain MRI examinations of 03/21/2020. Thus, the finding at this site on today's examination may reflect an interval acute hemorrhage or dystrophic calcification from the prior hemorrhage. A brain MRI may be helpful for further characterization. Known chronic lacunar infarcts within the bilateral hemispheric cerebral white matter, basal ganglia, thalami and within the pons. Stable background severe cerebral white matter chronic small vessel ischemic disease. Generalized parenchymal atrophy. Trace bilateral mastoid effusions. Electronically Signed   By: Jackey Loge DO   On: 08/15/2020 18:48   MR BRAIN WO CONTRAST  Result Date: 08/15/2020 CLINICAL DATA:  Follow-up examination for acute delirium, possible hemorrhage on prior CT. EXAM: MRI HEAD  WITHOUT CONTRAST TECHNIQUE: Multiplanar, multiecho pulse sequences of the brain and surrounding structures were obtained without intravenous contrast. COMPARISON:  Prior CT from earlier the same day as well as earlier studies. FINDINGS: Brain: Examination technically limited by motion artifact and the patient's inability to tolerate the full length of the study. Moderately advanced cerebral and cerebellar atrophy. Patchy and confluent T2/signal abnormality involving the periventricular and deep white matter both cerebral hemispheres as well as the brainstem, consistent with chronic microvascular ischemic disease, fairly advanced in nature. Multiple scattered superimposed remote lacunar infarcts noted about the hemispheric cerebral white matter, basal ganglia, thalami, and pons. Previously noted subcentimeter hyperdense focus at the left ventral pons demonstrates  strongly hypointense T2 signal intensity, with isointense precontrast T1 signal intensity. This area blooms and is obscured on gradient echo sequence. No significant surrounding edema is seen within this area, although evaluation somewhat limited given the lack of a FLAIR sequence. Finding remains somewhat indeterminate by MRI, although is favored to reflect the late subacute to chronic residua of prior bleed seen at this location. No other abnormal foci of restricted diffusion to suggest acute or subacute ischemia. Gray-white matter differentiation otherwise maintained. No other areas of acute intracranial hemorrhage. Again seen are innumerable scattered chronic micro hemorrhages clustered about the brainstem and deep gray nuclei, most likely related chronic poorly controlled hypertension. No mass lesion, midline shift or mass effect. Mild ventricular prominence related to global parenchymal volume loss without hydrocephalus. No extra-axial fluid collection. Partially empty sella noted. Vascular: Major intracranial vascular flow voids are maintained. Skull  and upper cervical spine: Craniocervical junction within normal limits. Bone marrow signal intensity normal. No scalp soft tissue abnormality. Sinuses/Orbits: Globes and orbital soft tissues within normal limits. Paranasal sinuses are largely clear. No significant mastoid effusion. Other: None. IMPRESSION: 1. Technically limited exam due to motion artifact and the patient's inability to tolerate the full length of the study. 2. Previously noted subcentimeter hyperdense focus at the left ventral pons remains indeterminate by MRI, although is favored to reflect the late subacute to chronic residua of prior bleed seen at this location. Given that the exact age determination remains somewhat difficult, a short interval follow-up CT in 12-24 hours for direct comparison purposes would likely be helpful. 3. No other acute intracranial abnormality. 4. Moderately advanced cerebral and cerebellar atrophy with chronic microvascular ischemic disease. 5. Innumerable scattered chronic micro hemorrhages clustered about the brainstem and deep gray nuclei, most consistent with chronic poorly controlled hypertension. Electronically Signed   By: Rise Mu M.D.   On: 08/15/2020 22:55   DG Chest Port 1 View  Result Date: 08/15/2020 CLINICAL DATA:  Dyspnea EXAM: PORTABLE CHEST 1 VIEW COMPARISON:  March 21, 2020 FINDINGS: The heart size and mediastinal contours are within normal limits. Both lungs are clear. The visualized skeletal structures are unremarkable. IMPRESSION: No active disease. Electronically Signed   By: Sherian Rein M.D.   On: 08/15/2020 16:06   DG Abd Portable 1 View  Result Date: 08/15/2020 CLINICAL DATA:  Pre MRI evaluation EXAM: PORTABLE ABDOMEN - 1 VIEW COMPARISON:  None. FINDINGS: Scattered large and small bowel gas is noted. No radiopaque foreign body is seen. No implanted device is seen. Degenerative changes of lumbar spine are noted. IMPRESSION: No acute abnormality noted.  No metal devices  are seen. Electronically Signed   By: Alcide Clever M.D.   On: 08/15/2020 20:17        Scheduled Meds: . amLODipine  5 mg Oral Daily  . atorvastatin  40 mg Oral q1800  . budesonide (PULMICORT) nebulizer solution  0.25 mg Nebulization BID  . divalproex  250 mg Oral QHS  . fluPHENAZine  5 mg Oral QHS  . insulin aspart  0-9 Units Subcutaneous TID WC  . metoprolol tartrate  50 mg Oral BID   Continuous Infusions: . 0.9 % NaCl with KCl 20 mEq / L 75 mL/hr at 08/16/20 0135     LOS: 0 days    Time spent: 35 minutes    Berton Mount, MD  [facility] Pager #: [phone] 7PM-7AM contact night coverage as above

## 2020-08-16 NOTE — Progress Notes (Signed)
Daughter requesting to restart patient's gabapentin for pt's athritis.  MD Ogbata notified and aware. Not to give d/t AMS at this time per MD . Will give tylenol PRN.

## 2020-08-16 NOTE — Progress Notes (Signed)
MD Ogbata notified for alternative pain medicine. Pain is 7/10 in pts legs. Awaiting orders.

## 2020-08-17 ENCOUNTER — Other Ambulatory Visit (HOSPITAL_COMMUNITY): Payer: Self-pay

## 2020-08-17 ENCOUNTER — Other Ambulatory Visit: Payer: Self-pay

## 2020-08-17 DIAGNOSIS — D638 Anemia in other chronic diseases classified elsewhere: Secondary | ICD-10-CM | POA: Diagnosis present

## 2020-08-17 DIAGNOSIS — E876 Hypokalemia: Secondary | ICD-10-CM | POA: Diagnosis present

## 2020-08-17 DIAGNOSIS — Z20822 Contact with and (suspected) exposure to covid-19: Secondary | ICD-10-CM | POA: Diagnosis present

## 2020-08-17 DIAGNOSIS — G4733 Obstructive sleep apnea (adult) (pediatric): Secondary | ICD-10-CM | POA: Diagnosis present

## 2020-08-17 DIAGNOSIS — Z880 Allergy status to penicillin: Secondary | ICD-10-CM | POA: Diagnosis not present

## 2020-08-17 DIAGNOSIS — E119 Type 2 diabetes mellitus without complications: Secondary | ICD-10-CM | POA: Diagnosis present

## 2020-08-17 DIAGNOSIS — R1310 Dysphagia, unspecified: Secondary | ICD-10-CM

## 2020-08-17 DIAGNOSIS — R4189 Other symptoms and signs involving cognitive functions and awareness: Secondary | ICD-10-CM | POA: Diagnosis present

## 2020-08-17 DIAGNOSIS — G9340 Encephalopathy, unspecified: Secondary | ICD-10-CM | POA: Diagnosis present

## 2020-08-17 DIAGNOSIS — E860 Dehydration: Secondary | ICD-10-CM | POA: Diagnosis present

## 2020-08-17 DIAGNOSIS — L89224 Pressure ulcer of left hip, stage 4: Secondary | ICD-10-CM | POA: Diagnosis present

## 2020-08-17 DIAGNOSIS — J449 Chronic obstructive pulmonary disease, unspecified: Secondary | ICD-10-CM | POA: Diagnosis present

## 2020-08-17 DIAGNOSIS — F200 Paranoid schizophrenia: Secondary | ICD-10-CM | POA: Diagnosis present

## 2020-08-17 DIAGNOSIS — F0390 Unspecified dementia without behavioral disturbance: Secondary | ICD-10-CM | POA: Diagnosis present

## 2020-08-17 DIAGNOSIS — D72829 Elevated white blood cell count, unspecified: Secondary | ICD-10-CM | POA: Diagnosis present

## 2020-08-17 DIAGNOSIS — I10 Essential (primary) hypertension: Secondary | ICD-10-CM | POA: Diagnosis present

## 2020-08-17 DIAGNOSIS — G9341 Metabolic encephalopathy: Secondary | ICD-10-CM | POA: Diagnosis present

## 2020-08-17 DIAGNOSIS — Z7984 Long term (current) use of oral hypoglycemic drugs: Secondary | ICD-10-CM | POA: Diagnosis not present

## 2020-08-17 DIAGNOSIS — E785 Hyperlipidemia, unspecified: Secondary | ICD-10-CM | POA: Diagnosis present

## 2020-08-17 DIAGNOSIS — I69922 Dysarthria following unspecified cerebrovascular disease: Secondary | ICD-10-CM | POA: Diagnosis not present

## 2020-08-17 DIAGNOSIS — Z888 Allergy status to other drugs, medicaments and biological substances status: Secondary | ICD-10-CM | POA: Diagnosis not present

## 2020-08-17 DIAGNOSIS — Z79899 Other long term (current) drug therapy: Secondary | ICD-10-CM | POA: Diagnosis not present

## 2020-08-17 DIAGNOSIS — Z8249 Family history of ischemic heart disease and other diseases of the circulatory system: Secondary | ICD-10-CM | POA: Diagnosis not present

## 2020-08-17 LAB — CBC WITH DIFFERENTIAL/PLATELET
Abs Immature Granulocytes: 0.08 10*3/uL — ABNORMAL HIGH (ref 0.00–0.07)
Basophils Absolute: 0 10*3/uL (ref 0.0–0.1)
Basophils Relative: 0 %
Eosinophils Absolute: 0.1 10*3/uL (ref 0.0–0.5)
Eosinophils Relative: 1 %
HCT: 27.4 % — ABNORMAL LOW (ref 36.0–46.0)
Hemoglobin: 8.6 g/dL — ABNORMAL LOW (ref 12.0–15.0)
Immature Granulocytes: 1 %
Lymphocytes Relative: 10 %
Lymphs Abs: 1.6 10*3/uL (ref 0.7–4.0)
MCH: 29.6 pg (ref 26.0–34.0)
MCHC: 31.4 g/dL (ref 30.0–36.0)
MCV: 94.2 fL (ref 80.0–100.0)
Monocytes Absolute: 1 10*3/uL (ref 0.1–1.0)
Monocytes Relative: 7 %
Neutro Abs: 12.4 10*3/uL — ABNORMAL HIGH (ref 1.7–7.7)
Neutrophils Relative %: 81 %
Platelets: 454 10*3/uL — ABNORMAL HIGH (ref 150–400)
RBC: 2.91 MIL/uL — ABNORMAL LOW (ref 3.87–5.11)
RDW: 13.2 % (ref 11.5–15.5)
WBC: 15.1 10*3/uL — ABNORMAL HIGH (ref 4.0–10.5)
nRBC: 0 % (ref 0.0–0.2)

## 2020-08-17 LAB — RENAL FUNCTION PANEL
Albumin: 2.3 g/dL — ABNORMAL LOW (ref 3.5–5.0)
Anion gap: 7 (ref 5–15)
BUN: 12 mg/dL (ref 8–23)
CO2: 28 mmol/L (ref 22–32)
Calcium: 8.3 mg/dL — ABNORMAL LOW (ref 8.9–10.3)
Chloride: 103 mmol/L (ref 98–111)
Creatinine, Ser: 0.45 mg/dL (ref 0.44–1.00)
GFR, Estimated: >60 mL/min (ref >60)
Glucose, Bld: 160 mg/dL — ABNORMAL HIGH (ref 70–99)
Phosphorus: 2.3 mg/dL — ABNORMAL LOW (ref 2.5–4.6)
Potassium: 3.6 mmol/L (ref 3.5–5.1)
Sodium: 138 mmol/L (ref 135–145)

## 2020-08-17 LAB — HEMOGLOBIN A1C
Hgb A1c MFr Bld: 6.4 % — ABNORMAL HIGH (ref 4.8–5.6)
Mean Plasma Glucose: 137 mg/dL

## 2020-08-17 LAB — GLUCOSE, CAPILLARY
Glucose-Capillary: 85 mg/dL (ref 70–99)
Glucose-Capillary: 159 mg/dL — ABNORMAL HIGH (ref 70–99)
Glucose-Capillary: 91 mg/dL (ref 70–99)
Glucose-Capillary: 175 mg/dL — ABNORMAL HIGH (ref 70–99)

## 2020-08-17 LAB — MAGNESIUM: Magnesium: 1.8 mg/dL (ref 1.7–2.4)

## 2020-08-17 LAB — MRSA PCR SCREENING: MRSA by PCR: NEGATIVE

## 2020-08-17 MED ORDER — COLLAGENASE 250 UNIT/GM EX OINT
TOPICAL_OINTMENT | Freq: Every day | CUTANEOUS | Status: DC
  Filled 2020-08-17: qty 30

## 2020-08-17 MED ORDER — POTASSIUM CHLORIDE 10 MEQ/100ML IV SOLN: 10.0000 meq | INTRAVENOUS | Status: DC

## 2020-08-17 MED ORDER — POTASSIUM PHOSPHATES 15 MMOLE/5ML IV SOLN
20.0000 mmol | Freq: Once | INTRAVENOUS | Status: AC
  Administered 2020-08-17: 20 mmol via INTRAVENOUS
  Filled 2020-08-17: qty 6.67

## 2020-08-17 NOTE — Progress Notes (Signed)
Patient does not want to wear cpap for the night. Sp02=96% on Room air

## 2020-08-17 NOTE — Consult Note (Signed)
WOC Nurse Consult Note: Reason for Consult: sacral pressure injury Wound type: Unstageable pressure injury; left ischium (not sacral) Pressure Injury POA: Yes Measurement: 6cm x7cm x 0.5cm  Wound bed:80% yellow/grey  20% pale pink Drainage (amount, consistency, odor) moderate, serosanguinous  Periwound: intact  Dressing procedure/placement/frequency: Clean wound with saline, apply 1/4" thick layer of Santyl to the wound bed, fill with saline moist gauze, top with dry dressing, secure with tape. Change daily.   Mattress replacement for moisture management and pressure redistribution.  Maximize nutrition if possible  Discussed POC with patient and bedside nurse.  Re consult if needed, will not follow at this time. Thanks  Yolande Skoda [employer], RN,CWOCN, CNS, CWON-AP [phone])

## 2020-08-17 NOTE — Progress Notes (Signed)
PROGRESS NOTE    Brenda BannisterGloria J Riggs  ZOX:[id number] DOB: 02/16/1956 DOA: 08/15/2020 PCP: Jackie Plumsei-Bonsu, George, MD  Outpatient Specialists:   Brief Narrative:  As per H&P done on admission: "Brenda Riggs is a 65 y.o. 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  At her baseline, patient was nonambulatory, has severe dysarthria, dysphagia, has had worsening dysphagia recently as well as decreased level of consciousness.  He is unable to contribute much to the history due to her baseline cognitive deficits and severe dysarthria.  ED Course: Upon arrival to the ED, patient is found to be afebrile, saturating well on room air, and with stable blood pressure.  EKG features sinus rhythm and chest x-ray negative for acute cardiopulmonary disease.  Head CT notable for a 5 mm focus of hyperdensity pons at the site of prior acute parenchymal hemorrhage in November.  Further assessed with MRI brain but the lesion indeterminate lobe possibly calcification or acute hemorrhage.  ED discussed the case with neurology who felt that radiographic findings are less likely to reflect an acute hemorrhage but agreed with repeat head CT in 12 to 24 hours.  ED work-up also notable for potassium 3.0, WBC 12,200, platelets 172,000, and hemoglobin 8.6, down from 11.4 in November".  08/16/2020: Repeat CT head has not shown any acute bleed.  UA has not been visualized.  Patient looks dehydrated.  Input revealed sodium of 143, potassium of 3.5, magnesium of 2.1.  Patient is currently on half-normal saline +20 M EQ of KCl at 100 cc/h.  We will follow UA when available as well as urine culture i.e. if indicated.  Medications reviewed and optimized.  Further management depend on hospital  course.  08/17/2020: Patient seen.  Patient is more communicative today.  We will continue IV fluids.  We will continue to monitor and replete electrolytes.  Urinalysis when possible, as well as, urine culture if indicated.  Continue to minimize mind altering medications.  Assessment & Plan:   Principal Problem:   Decreased level of consciousness Active Problems:   Type II diabetes mellitus (HCC)   Paranoid schizophrenia (HCC)   Essential hypertension, benign   Asthma   OSA (obstructive sleep apnea)   Pressure injury of skin   History of hemorrhagic cerebrovascular accident (CVA) with residual deficit   Hypokalemia   Dysphagia   Acute encephalopathy  1. Dysphagia; decreased LOC (acute metabolic encephalopathy); indeterminate left pons lesion  - Patient with hx of CVAs and chronic dysphagia presents with decreased LOC and worsened dysphagia and is found to have left pons lesion at site of prior hemorrhage that could reflect calcification or new hemorrhage  - Repeat CT head is negative for bleed. -TSH is within normal range. -Patient is dehydrated. -Cannot rule out other causes of toxic encephalopathy. -Pursue urinalysis and urine culture if indicated. -Optimize patient's medication, especially mind altering medications.    2. Schizophrenia; dementia  - Calm and cooperative in ED  - Continue fluphenazine and Depakote, delirium precautions  08/16/2020: Hold fluphenazine.  Have a low threshold to consult psychiatry. 08/17/2020: No behavioral challenges.  No psychosis.  Continue to monitor closely.  3. Type II DM  - Check CBGs and use a low-intensity SSI for now  4. Hypertension  - Use IV labetalol as needed pending SLP eval   08/16/2020:  Goal blood pressure should be less than 130/80 mmHg.  5. Anemia  - Hgb is 8.6, down from 11.4 in November without overt bleeding  - Check anemia panel, trend   08/16/2020: Anemia work-up is indicative of anemia of chronic inflammation.  6.  Hypokalemia  - Serum potassium is 3.0 in ED  - Replace, repeat chem panel  08/16/2020: Repeat renal panel.  Continue to replete potassium. 08/17/2020: Potassium is 3.6 today.  Phosphorus is 2.3.  Will give IV potassium phosphate 20 mmol x 1 dose.  Continue IV fluids with added potassium chloride.  7. OSA; asthma  - No cough or wheezing on admission  - Continue CPAP qHS, ICS, as needed albuterol    8.  Dehydration: -Continue IV fluids. -Continue to monitor volume status and electrolytes.   DVT prophylaxis: SCD Code Status: Full code Family Communication:  Disposition Plan: This will depend on hospital course.  Likely discharge back to skilled nursing facility.   Consultants:   None.  Procedures:   None  Antimicrobials:   None   Subjective: -Patient cannot give coherent history.    Objective: Vitals:   08/16/20 2009 08/17/20 0559 08/17/20 0745 08/17/20 1333  BP: 135/64 139/61  134/72  Pulse: 84 84  84  Resp:  20  17  Temp: 99.9 F (37.7 C) 98.5 F (36.9 C)  98.6 F (37 C)  TempSrc: Oral Oral  Oral  SpO2: 98% 98% 97% 100%    Intake/Output Summary (Last 24 hours) at 08/17/2020 1621 Last data filed at 08/17/2020 1538 Gross per 24 hour  Intake 2022.85 ml  Output --  Net 2022.85 ml   There were no vitals filed for this visit.  Examination:  General exam: Patient is not in any distress.  Patient only obeys simple commands.  Contracture of left lower extremity.    Respiratory system: Clear to auscultation.  Cardiovascular system: S1 & S2 heard. Gastrointestinal system: Abdomen is nondistended, soft and nontender. No organomegaly or masses felt. Normal bowel sounds heard. Central nervous system: Awake.  Obeys simple commands.  Moves extremities except a contracted left lower extremity.   Extremities: No leg edema  Data Reviewed: I have personally reviewed following labs and imaging studies  CBC: Recent Labs  Lab 08/15/20 1608 08/16/20 0459 08/17/20 1159   WBC 12.2* 12.8* 15.1*  NEUTROABS 8.8*  --  12.4*  HGB 8.6* 8.6* 8.6*  HCT 27.3* 27.7* 27.4*  MCV 95.8 95.2 94.2  PLT 422* 425* 454*   Basic Metabolic Panel: Recent Labs  Lab 08/15/20 1608 08/16/20 0459 08/16/20 1448 08/17/20 1159  NA 147* 146* 143 138  K 3.0* 3.1* 3.5 3.6  CL 108 106 104 103  CO2 GLUCOSE 101* 84 102* 160*  BUN CREATININE 0.44 0.51 0.44 0.45  CALCIUM 8.3* 8.2* 8.4* 8.3*  MG  --  1.6* 2.1 1.8  PHOS  --   --  2.7 2.3*   GFR: CrCl cannot be calculated (Unknown ideal weight.). Liver Function Tests: Recent Labs  Lab 08/15/20 1608 08/16/20 1448 08/17/20 1159  AST 15  --   --   ALT 12  --   --   ALKPHOS 51  --   --   BILITOT 0.4  --   --   PROT 6.6  --   --   ALBUMIN 2.5* 2.4* 2.3*   No results for input(s): LIPASE, AMYLASE in the last 168 hours. No results for input(s): AMMONIA in  the last 168 hours. Coagulation Profile: Recent Labs  Lab 08/15/20 1608  INR 1.1   Cardiac Enzymes: No results for input(s): CKTOTAL, CKMB, CKMBINDEX, TROPONINI in the last 168 hours. BNP (last 3 results) No results for input(s): PROBNP in the last 8760 hours. HbA1C: Recent Labs    08/16/20 0459  HGBA1C 6.4*   CBG: Recent Labs  Lab 08/16/20 1541 08/16/20 2006 08/17/20 0749 08/17/20 1147 08/17/20 1606  GLUCAP 105* 119* 85 159* 91   Lipid Profile: No results for input(s): CHOL, HDL, LDLCALC, TRIG, CHOLHDL, LDLDIRECT in the last 72 hours. Thyroid Function Tests: Recent Labs    08/16/20 0459  TSH 1.204   Anemia Panel: Recent Labs    08/16/20 0459  VITAMINB12 1,219*  FOLATE 19.0  FERRITIN 185  TIBC 186*  IRON 16*  RETICCTPCT 1.1   Urine analysis:    Component Value Date/Time   COLORURINE YELLOW 03/22/2020 1006   APPEARANCEUR CLEAR 03/22/2020 1006   LABSPEC 1.010 03/22/2020 1006   PHURINE 7.0 03/22/2020 1006   GLUCOSEU NEGATIVE 03/22/2020 1006   HGBUR NEGATIVE 03/22/2020 1006   HGBUR trace-intact 09/10/2009 1115    BILIRUBINUR NEGATIVE 03/22/2020 1006   KETONESUR NEGATIVE 03/22/2020 1006   PROTEINUR NEGATIVE 03/22/2020 1006   UROBILINOGEN 0.2 10/22/2009 2021   NITRITE NEGATIVE 03/22/2020 1006   LEUKOCYTESUR NEGATIVE 03/22/2020 1006   Sepsis Labs: @LABRCNTIP (procalcitonin:4,lacticidven:4)  ) Recent Results (from the past 240 hour(s))  Culture, blood (routine x 2)     Status: None (Preliminary result)   Collection Time: 08/15/20  4:08 PM   Specimen: BLOOD LEFT HAND  Result Value Ref Range Status   Specimen Description BLOOD LEFT HAND  Final   Special Requests   Final    BOTTLES DRAWN AEROBIC AND ANAEROBIC Blood Culture adequate volume   Culture   Final    NO GROWTH 2 DAYS Performed at [facility], [address], [place], [place]    Report Status PENDING  Incomplete  Resp Panel by RT-PCR (Flu A&B, Covid) Nasopharyngeal Swab     Status: None   Collection Time: 08/15/20  4:09 PM   Specimen: Nasopharyngeal Swab; Nasopharyngeal(NP) swabs in vial transport medium  Result Value Ref Range Status   SARS Coronavirus 2 by RT PCR NEGATIVE NEGATIVE Final    Comment: (NOTE) SARS-CoV-2 target nucleic acids are NOT DETECTED.  The SARS-CoV-2 RNA is generally detectable in upper respiratory specimens during the acute phase of infection. The lowest concentration of SARS-CoV-2 viral copies this assay can detect is 138 copies/mL. A negative result does not preclude SARS-Cov-2 infection and should not be used as the sole basis for treatment or other patient management decisions. A negative result may occur with  improper specimen collection/handling, submission of specimen other than nasopharyngeal swab, presence of viral mutation(s) within the areas targeted by this assay, and inadequate number of viral copies(<138 copies/mL). A negative result must be combined with clinical observations, patient history, and epidemiological information. The expected result is Negative.  Fact Sheet  for Patients:  08/17/20  Fact Sheet for Healthcare Providers:  BloggerCourse.com  This test is no t yet approved or cleared by the [employer] FDA and  has been authorized for detection and/or diagnosis of SARS-CoV-2 by FDA under an Emergency Use Authorization (EUA). This EUA will remain  in effect (meaning this test can be used) for the duration of the COVID-19 declaration under Section 564(b)(1) of the Act, 21 U.S.C.section 360bbb-3(b)(1), unless the authorization is terminated  or  revoked sooner.       Influenza A by PCR NEGATIVE NEGATIVE Final   Influenza B by PCR NEGATIVE NEGATIVE Final    Comment: (NOTE) The Xpert Xpress SARS-CoV-2/FLU/RSV plus assay is intended as an aid in the diagnosis of influenza from Nasopharyngeal swab specimens and should not be used as a sole basis for treatment. Nasal washings and aspirates are unacceptable for Xpert Xpress SARS-CoV-2/FLU/RSV testing.  Fact Sheet for Patients: BloggerCourse.com  Fact Sheet for Healthcare Providers: SeriousBroker.it  This test is not yet approved or cleared by the [place] FDA and has been authorized for detection and/or diagnosis of SARS-CoV-2 by FDA under an Emergency Use Authorization (EUA). This EUA will remain in effect (meaning this test can be used) for the duration of the COVID-19 declaration under Section 564(b)(1) of the Act, 21 U.S.C. section 360bbb-3(b)(1), unless the authorization is terminated or revoked.  Performed at [facility], [address], [place], [place] [address]   Culture, blood (routine x 2)     Status: None (Preliminary result)   Collection Time: 08/15/20  4:10 PM   Specimen: BLOOD RIGHT ARM  Result Value Ref Range Status   Specimen Description   Final    BLOOD RIGHT ARM Performed at [facility], [address], [place], [place]  [address]    Special Requests   Final    BOTTLES DRAWN AEROBIC AND ANAEROBIC Blood Culture results may not be optimal due to an inadequate volume of blood received in culture bottles Performed at [facility], [address] [place], [place] [address]    Culture   Final    NO GROWTH 2 DAYS Performed at [facility], [address], [place], [place] [address]    Report Status PENDING  Incomplete  MRSA PCR Screening     Status: None   Collection Time: 08/17/20 10:55 AM   Specimen: Nasal Mucosa; Nasopharyngeal  Result Value Ref Range Status   MRSA by PCR NEGATIVE NEGATIVE Final    Comment:        The GeneXpert MRSA Assay (FDA approved for NASAL specimens only), is one component of a comprehensive MRSA colonization surveillance program. It is not intended to diagnose MRSA infection nor to guide or monitor treatment for MRSA infections. Performed at [facility], [address], [place], [place] [address]          Radiology Studies: CT HEAD WO CONTRAST  Result Date: 08/16/2020 CLINICAL DATA:  Mental status change with unknown cause. EXAM: CT HEAD WITHOUT CONTRAST TECHNIQUE: Contiguous axial images were obtained from the base of the skull through the vertex without intravenous contrast. COMPARISON:  Brain MRI and CT from yesterday FINDINGS: Brain: Subcentimeter high-density at the left pons without detected brain swelling or interval increase. Larger high-density area in this location on 03/21/2020 head CT. Severe chronic small vessel ischemia with brain atrophy, confluent gliosis, and multiple remote micro hemorrhages by brain MRI. No evidence of acute gray matter infarct. No obstructive hydrocephalus Vascular: No hyperdense vessel or unexpected calcification. Skull: Normal. Negative for fracture or focal lesion. Sinuses/Orbits: Negative IMPRESSION: 1. Stable subcentimeter high-density in the left pons, favor mineralization related to the acute  hemorrhage seen in this location November 2021. 2. Severe chronic small vessel disease. Electronically Signed   By: Marnee Spring M.D.   On: 08/16/2020 10:23   CT Head Wo Contrast  Result Date: 08/15/2020 CLINICAL DATA:  Delirium.  History of stroke.  Poor  historian. EXAM: CT HEAD WITHOUT CONTRAST TECHNIQUE: Contiguous axial images were obtained from the base of the skull through the vertex without intravenous contrast. COMPARISON:  Prior brain MRI 03/21/2020. FINDINGS: Brain: Mild cerebral and cerebellar atrophy. There is a 5 mm focus of hyperdensity within the left pons. An acute parenchymal hemorrhage was present at this site on the prior head CT and brain MRI examinations of 03/21/2020 (measuring 10 mm at that time). Known chronic lacunar infarcts within the bilateral hemispheric cerebral white matter, basal ganglia, thalami and within the pons, some of which were better appreciated on the prior brain MRI of 03/21/2020. Background advanced patchy and ill-defined hypoattenuation within the cerebral white matter is nonspecific, but compatible with chronic small vessel ischemic disease. No demarcated cortical infarct. No extra-axial fluid collection. No evidence of intracranial mass. No midline shift. Partially empty sella turcica. Vascular: No hyperdense vessel.  Atherosclerotic calcifications. Skull: Normal. Negative for fracture or focal lesion. Sinuses/Orbits: Visualized orbits show no acute finding. No significant paranasal sinus disease. Other: Trace bilateral mastoid effusions. These results were called by telephone at the time of interpretation on 08/15/2020 at 6:40 pm to provider [facility] , who verbally acknowledged these results. IMPRESSION: 5 mm focus of hyperdensity within the left pons. A 10 mm acute parenchymal hemorrhage was present at this site on the prior head CT and brain MRI examinations of 03/21/2020. Thus, the finding at this site on today's examination may reflect an interval acute  hemorrhage or dystrophic calcification from the prior hemorrhage. A brain MRI may be helpful for further characterization. Known chronic lacunar infarcts within the bilateral hemispheric cerebral white matter, basal ganglia, thalami and within the pons. Stable background severe cerebral white matter chronic small vessel ischemic disease. Generalized parenchymal atrophy. Trace bilateral mastoid effusions. Electronically Signed   By: Jackey Loge DO   On: 08/15/2020 18:48   MR BRAIN WO CONTRAST  Result Date: 08/15/2020 CLINICAL DATA:  Follow-up examination for acute delirium, possible hemorrhage on prior CT. EXAM: MRI HEAD WITHOUT CONTRAST TECHNIQUE: Multiplanar, multiecho pulse sequences of the brain and surrounding structures were obtained without intravenous contrast. COMPARISON:  Prior CT from earlier the same day as well as earlier studies. FINDINGS: Brain: Examination technically limited by motion artifact and the patient's inability to tolerate the full length of the study. Moderately advanced cerebral and cerebellar atrophy. Patchy and confluent T2/signal abnormality involving the periventricular and deep white matter both cerebral hemispheres as well as the brainstem, consistent with chronic microvascular ischemic disease, fairly advanced in nature. Multiple scattered superimposed remote lacunar infarcts noted about the hemispheric cerebral white matter, basal ganglia, thalami, and pons. Previously noted subcentimeter hyperdense focus at the left ventral pons demonstrates strongly hypointense T2 signal intensity, with isointense precontrast T1 signal intensity. This area blooms and is obscured on gradient echo sequence. No significant surrounding edema is seen within this area, although evaluation somewhat limited given the lack of a FLAIR sequence. Finding remains somewhat indeterminate by MRI, although is favored to reflect the late subacute to chronic residua of prior bleed seen at this location. No  other abnormal foci of restricted diffusion to suggest acute or subacute ischemia. Gray-white matter differentiation otherwise maintained. No other areas of acute intracranial hemorrhage. Again seen are innumerable scattered chronic micro hemorrhages clustered about the brainstem and deep gray nuclei, most likely related chronic poorly controlled hypertension. No mass lesion, midline shift or mass effect. Mild ventricular prominence related to global parenchymal volume loss without hydrocephalus. No extra-axial fluid collection. Partially  empty sella noted. Vascular: Major intracranial vascular flow voids are maintained. Skull and upper cervical spine: Craniocervical junction within normal limits. Bone marrow signal intensity normal. No scalp soft tissue abnormality. Sinuses/Orbits: Globes and orbital soft tissues within normal limits. Paranasal sinuses are largely clear. No significant mastoid effusion. Other: None. IMPRESSION: 1. Technically limited exam due to motion artifact and the patient's inability to tolerate the full length of the study. 2. Previously noted subcentimeter hyperdense focus at the left ventral pons remains indeterminate by MRI, although is favored to reflect the late subacute to chronic residua of prior bleed seen at this location. Given that the exact age determination remains somewhat difficult, a short interval follow-up CT in 12-24 hours for direct comparison purposes would likely be helpful. 3. No other acute intracranial abnormality. 4. Moderately advanced cerebral and cerebellar atrophy with chronic microvascular ischemic disease. 5. Innumerable scattered chronic micro hemorrhages clustered about the brainstem and deep gray nuclei, most consistent with chronic poorly controlled hypertension. Electronically Signed   By: Rise Mu M.D.   On: 08/15/2020 22:55   DG Abd Portable 1 View  Result Date: 08/15/2020 CLINICAL DATA:  Pre MRI evaluation EXAM: PORTABLE ABDOMEN - 1 VIEW  COMPARISON:  None. FINDINGS: Scattered large and small bowel gas is noted. No radiopaque foreign body is seen. No implanted device is seen. Degenerative changes of lumbar spine are noted. IMPRESSION: No acute abnormality noted.  No metal devices are seen. Electronically Signed   By: Alcide Clever M.D.   On: 08/15/2020 20:17        Scheduled Meds: . amLODipine  5 mg Oral Daily  . atorvastatin  40 mg Oral q1800  . budesonide (PULMICORT) nebulizer solution  0.25 mg Nebulization BID  . collagenase   Topical Daily  . divalproex  250 mg Oral QHS  . insulin aspart  0-9 Units Subcutaneous TID WC  . metoprolol tartrate  50 mg Oral BID   Continuous Infusions: . 0.45 % NaCl with KCl 20 mEq / L 100 mL/hr at 08/17/20 1005     LOS: 0 days    Time spent: 35 minutes    Berton Mount, MD  [facility] Pager #: [phone] 7PM-7AM contact night coverage as above

## 2020-08-17 NOTE — Consult Note (Signed)
I have placed a request via Secure Chat to Dr.Ogbata requesting photos of the wound areas of concern to be placed in the EMR.  Jvon Meroney MSN,RN,CWOCN, CNS, CWON-AP [phone]   

## 2020-08-17 NOTE — Progress Notes (Signed)
  Speech Language Pathology Treatment: Dysphagia  Patient Details Name: Brenda Riggs MRN: [id number] DOB: [birth year] Today's Date: 08/17/2020 Time: 6789-3810 SLP Time Calculation (min) (ACUTE ONLY): 25 min  Assessment / Plan / Recommendation Clinical Impression  Patient seen to address dysphagia goals. RN reports that patient was able to feed herself some at breakfast this morning when she was in a good upright position in bed. During today's ST session, patient was awake and alert and agreeable to eating lunch (lunch tray in room). SLP helped with raising HOB and slight repositioning of patient, however she appears very contracted with her legs and was unable to straighten out left leg which was bent at knee. Patient able to feed self with straw sips of liquids and bites of puree with fork/spoon but did require supervision and assistance intermittently to do so. Patient exhibited mild delayed throat clearing during PO intake but voice remained clear. No oral residuals observed post swallows. SLP plans to f/u with patient at least one more time to determine if continues to tolerate diet or needing objective swallow study.    HPI HPI: Brenda Riggs is a 65 y.o. 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  CXR was unremarkable. Pt was last seen for a BSE on 03/22/21 with recommendations for Dysphagia 1 (puree) solids and thin liquids.      SLP Plan  Continue with current plan of care       Recommendations  Diet recommendations: Dysphagia 1 (puree);Thin liquid Liquids provided via: Straw;Cup Medication Administration: Crushed with puree Supervision: Full supervision/cueing for compensatory strategies;Staff to assist with self feeding Compensations:  Minimize environmental distractions;Slow rate;Small sips/bites Postural Changes and/or Swallow Maneuvers: Seated upright 90 degrees                Oral Care Recommendations: Oral care BID;Staff/trained caregiver to provide oral care Follow up Recommendations: Skilled Nursing facility SLP Visit Diagnosis: Dysphagia, unspecified (R13.10) Plan: Continue with current plan of care       GO              Angela Nevin, MA, CCC-SLP 08/17/20 2:42 PM

## 2020-08-18 DIAGNOSIS — G9340 Encephalopathy, unspecified: Secondary | ICD-10-CM

## 2020-08-18 DIAGNOSIS — R4189 Other symptoms and signs involving cognitive functions and awareness: Secondary | ICD-10-CM | POA: Diagnosis not present

## 2020-08-18 LAB — GLUCOSE, CAPILLARY
Glucose-Capillary: 93 mg/dL (ref 70–99)
Glucose-Capillary: 187 mg/dL — ABNORMAL HIGH (ref 70–99)
Glucose-Capillary: 196 mg/dL — ABNORMAL HIGH (ref 70–99)
Glucose-Capillary: 117 mg/dL — ABNORMAL HIGH (ref 70–99)

## 2020-08-18 LAB — RENAL FUNCTION PANEL
Albumin: 2.2 g/dL — ABNORMAL LOW (ref 3.5–5.0)
Anion gap: 7 (ref 5–15)
BUN: 7 mg/dL — ABNORMAL LOW (ref 8–23)
CO2: 28 mmol/L (ref 22–32)
Calcium: 8.1 mg/dL — ABNORMAL LOW (ref 8.9–10.3)
Chloride: 102 mmol/L (ref 98–111)
Creatinine, Ser: 0.47 mg/dL (ref 0.44–1.00)
GFR, Estimated: >60 mL/min (ref >60)
Glucose, Bld: 91 mg/dL (ref 70–99)
Phosphorus: 3.2 mg/dL (ref 2.5–4.6)
Potassium: 4.2 mmol/L (ref 3.5–5.1)
Sodium: 137 mmol/L (ref 135–145)

## 2020-08-18 LAB — MAGNESIUM: Magnesium: 1.7 mg/dL (ref 1.7–2.4)

## 2020-08-18 MED ORDER — ADULT MULTIVITAMIN W/MINERALS CH
1.0000 | ORAL_TABLET | Freq: Every day | ORAL | Status: DC
  Administered 2020-08-18 – 2020-08-20 (×3): 1 via ORAL
  Filled 2020-08-18 (×3): qty 1

## 2020-08-18 MED ORDER — MAGNESIUM SULFATE 2 GM/50ML IV SOLN
2.0000 g | Freq: Once | INTRAVENOUS | Status: AC
  Administered 2020-08-18: 2 g via INTRAVENOUS
  Filled 2020-08-18: qty 50

## 2020-08-18 MED ORDER — ENSURE ENLIVE PO LIQD
237.0000 mL | Freq: Two times a day (BID) | ORAL | Status: DC
  Administered 2020-08-18 – 2020-08-20 (×5): 237 mL via ORAL

## 2020-08-18 MED ORDER — PROSOURCE PLUS PO LIQD
30.0000 mL | Freq: Every day | ORAL | Status: DC
  Administered 2020-08-18 – 2020-08-20 (×3): 30 mL via ORAL

## 2020-08-18 MED ORDER — JUVEN PO PACK
1.0000 | PACK | Freq: Two times a day (BID) | ORAL | Status: DC
  Administered 2020-08-18 – 2020-08-20 (×4): 1 via ORAL
  Filled 2020-08-18 (×5): qty 1

## 2020-08-18 NOTE — TOC Progression Note (Signed)
Transition of Care [facility]) - Progression Note    Patient Details  Name: Brenda Riggs MRN: [id number] Date of Birth: Jan 06, 1956  Transition of Care [facility]) CM/SW Contact  Masayoshi Couzens, Olegario Messier, RN Phone Number: 08/18/2020, 12:55 PM  Clinical Narrative:confirmed from [facility] LTC d/c plan to return.No PT cons needed.total care. Will need covid       Expected Discharge Plan: Long Term Nursing Home Barriers to Discharge: Continued Medical Work up  Expected Discharge Plan and Services Expected Discharge Plan: Long Term Nursing Home                                               Social Determinants of Health (SDOH) Interventions    Readmission Risk Interventions No flowsheet data found.

## 2020-08-18 NOTE — Plan of Care (Signed)
  Problem: Clinical Measurements: Goal: Will remain free from infection Outcome: Progressing   Problem: Nutrition: Goal: Adequate nutrition will be maintained Outcome: Progressing   Problem: Safety: Goal: Ability to remain free from injury will improve Outcome: Progressing   

## 2020-08-18 NOTE — Progress Notes (Signed)
  Speech Language Pathology Treatment: Dysphagia  Patient Details Name: Brenda Riggs MRN: [id number] DOB: 11/20/1955 Today's Date: 08/18/2020 Time: 1710-1735 SLP Time Calculation (min) (ACUTE ONLY): 25 min  Assessment / Plan / Recommendation Clinical Impression  Per notes in chart, pt with intake of 75% and 90% today - SLP follow up to assure po tolerance, assess for indication for instrumental swallow evaluation.  Per order in chart, pt is to have an OP MBS later April 2022 - recommend complete prior to dc hospital. Spoke to [facility] on the phone and she was agreeable to [facility] prior to dc.     Pt willing to consume intake of water and ice cream with this SLP.  Placed bed in reverse trendeleburg position for optimal po safety.  SLP had her scoop and bring spoon to her mouth to help with habitation of swallowing - with effectiveness.  Consumption of thin water via straw noted to result in audible swallow - ? CP dysfunction- and delayed throat clearing.  Using moderate verbal cues, pt able to follow directions for precautions.    Also noted pt to have snoring respirations at times and had retained secretions likely in pharynx that were propelled into oral cavity with respirations.  Thus reviewed with pt *and family in room* importance of oral care/hygeine to aid in secretion cleanliness for airway protection.  Dental brushing am, pm and oral suctioning after po advised.  Pt is severely dysarthric and demonstrates dense hypoglossal nerve deficits with lingual deviation to left upon protrusion.  Given level of dysphagia, concern is present for pt to be able to obtain adequate nutrition and hydration - note dietician following! Thanks.  SLP recommends to maximize liquid nutrition - and plan MBS during this hospital stay.  Swallow precaution signs updated and information shared with pt and family present, NT, RNs x2.    HPI HPI: Brenda Riggs is a 65 y.o. 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  CXR was unremarkable. Pt was last seen for a BSE on 03/22/21 with recommendations for Dysphagia 1 (puree) solids and thin liquids.  Spoke to daugher Brenda Riggs on the phone and she reports pt's intake has been poor.  Pt is scheduled for OP MBS 08/27/2020 per order in chart.      SLP Plan  Continue with current plan of care       Recommendations  Diet recommendations: Dysphagia 1 (puree);Thin liquid Liquids provided via: Straw;Cup Medication Administration: Crushed with puree Supervision: Full supervision/cueing for compensatory strategies;Staff to assist with self feeding Compensations: Minimize environmental distractions;Slow rate;Small sips/bites (reverse trendelenburg) Postural Changes and/or Swallow Maneuvers: Seated upright 90 degrees (delayed swallow, assure pt swallows before giving more, oral suction after po)                Oral Care Recommendations: Staff/trained caregiver to provide oral care (oral suction after po, brush teeth am/pm) Follow up Recommendations: Skilled Nursing facility SLP Visit Diagnosis: Dysphagia, unspecified (R13.10) Plan: Continue with current plan of care       GO               Brenda Infante, MS [facility] SLP Acute Rehab Services Office [phone] Pager [phone]   Brenda Riggs 08/18/2020, 8:02 PM

## 2020-08-18 NOTE — TOC Progression Note (Signed)
Transition of Care [facility]) - Progression Note    Patient Details  Name: Brenda Riggs MRN: [id number] Date of Birth: 05/08/1955  Transition of Care [facility]) CM/SW Contact  Yazaira Speas, Olegario Messier, RN Phone Number: 08/18/2020, 9:37 AM  Clinical Narrative:  Confirmed patient from [facility]-total care. D/c plan to return. No PT skill.          Expected Discharge Plan and Services                                                 Social Determinants of Health (SDOH) Interventions    Readmission Risk Interventions No flowsheet data found.

## 2020-08-18 NOTE — Progress Notes (Signed)
Initial Nutrition Assessment  DOCUMENTATION CODES:   Not applicable  INTERVENTION:  - will order Ensure Enlive BID, each supplement provides 350 kcal and 20 grams of protein. - will order 30 ml Prosource Plus once/day, each supplement provides 100 kcal and 15 grams protein.  - will order 1 tablet multivitamin with minerals/day. - will order Juven BID, each packet provides 95 calories, 2.5 grams of protein (collagen), and 9.8 grams of carbohydrate (3 grams sugar); also contains 7 grams of L-arginine and L-glutamine, 300 mg vitamin C, 15 mg vitamin E, 1.2 mcg vitamin B-12, 9.5 mg zinc, 200 mg calcium, and 1.5 g  Calcium Beta-hydroxy-Beta-methylbutyrate to support wound healing  - weigh patient today as she has not been weighed since 03/28/17.   NUTRITION DIAGNOSIS:   Increased nutrient needs related to acute illness,wound healing as evidenced by estimated needs.  GOAL:   Patient will meet greater than or equal to 90% of their needs  MONITOR:   PO intake,Supplement acceptance,Labs,Weight trends  REASON FOR ASSESSMENT:   Consult Wound healing  ASSESSMENT:   65 y.o. female with medical history of schizophrenia, dementia, COPD, OSA, type 2 DM, CVA, and L pontine hemorrhage in 03/2020. She presented to the ED due to decreased level of consciousness and worsening dysphagia. At baseline, patient is non-ambulatory, has severe dysarthria, dysphagia, has had worsening dysphagia recently.  Patient laying in bed with no family or visitors present at the time of RD visit this AM. Patient recalled eating breakfast but was unable to provide any other details. She was unable to provide any other nutrition-related information.  Able to talk with RN who reports patient ate most of breakfast and staff needed to feed her. Flow sheet documentation indicates patient ate 90% of breakfast this AM.   SLP saw patient yesterday and noted patient was able to self-feed for a few bites but did need assistance  and cueing.   She has not been weighed since 03/28/17. Estimated needs based on that weight (61.2 kg) but may need to be re-estimated once a more accurate weight is obtained.   Per notes: - dysphagia  - L pons lesion at site of prior hemorrhage - hx of schizophrenia and dementia - dehydration on admission   Labs reviewed; CBGs: 93 and 187 mg/dl, BUN: 7 mg/dl, Ca: 8.1 mg/dl. Medications reviewed; sliding scale novolog, 20 mmol IV KPhos x1 run 4/18.    NUTRITION - FOCUSED PHYSICAL EXAM:  Flowsheet Row Most Recent Value  Orbital Region No depletion  Upper Arm Region Mild depletion  Thoracic and Lumbar Region Unable to assess  Buccal Region No depletion  Temple Region No depletion  Clavicle Bone Region Mild depletion  Clavicle and Acromion Bone Region No depletion  Scapular Bone Region Unable to assess  Dorsal Hand No depletion  Patellar Region Mild depletion  Anterior Thigh Region Mild depletion  Posterior Calf Region Unable to assess  Edema (RD Assessment) Mild  [BLE]  Hair Reviewed  Eyes Reviewed  Mouth Reviewed  Skin Reviewed  Nails Reviewed       Diet Order:   Diet Order            DIET - DYS 1 Room service appropriate? 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

## 2020-08-18 NOTE — Progress Notes (Signed)
PROGRESS NOTE    Brenda Riggs    Code Status: Full Code  KGM:[id number] DOB: 07-07-1955 DOA: 08/15/2020 LOS: 1 days  PCP: [name], MD CC:  Chief Complaint  Patient presents with  . 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  At baseline, she is nonambulatory with severe dysarthria and dysphagia however this was worsened prompting her to come to the ED.  ED course: Afebrile, tolerating room air with stable BP.  Head CT with 5 mm focus of hyperdensity at the pons at the site of prior acute parenchymal hemorrhage in November which was further assessed by brain MRI but the lesion was thought to be either a calcification or acute hemorrhage.  The ED discussed with neurology who felt the radiographic findings were less likely to affect an acute hemorrhage and agreed with a repeat CT in 12 to 24 hours.  Labs positive for K3.0, WBC 12.2, platelets 172, Hb 8.6 down from 11.4 November.  UA was ordered but not obtained also, her fluphenazine was held.    A & P   Principal Problem:   Decreased level of consciousness Active Problems:   Type II diabetes mellitus (HCC)   Paranoid schizophrenia (HCC)   Essential hypertension, benign   Asthma   OSA (obstructive sleep apnea)   Pressure injury of skin   History of hemorrhagic cerebrovascular accident (CVA) with residual deficit   Hypokalemia   Dysphagia   Acute encephalopathy   1. Altered mental status, suspect acute metabolic encephalopathy unknown etiology a. Seems at/near baseline currently b. Initial head CT with possible hyperdensity of the pons and MRI was indeterminate c. Repeat head CT was negative for bleed d. She received IV fluids since admit e. Holding gabapentin f. Seems improved  after holding fluphenazine, will continue to hold g. UA ordered but not obtained  2. Acute on chronic anemia secondary to anemia of chronic disease a. Continue to monitor for now without any blood transfusion b. Transfuse if Hb less than 7.0 or symptomatic  3. Schizophrenia  dementia, at baseline a. Continue holding fluphenazine as this may have been contributing to AMS  4. Dysphagia a. Likely at her baseline from prior stroke b. Dysphagia 1 per SLP  5. Hypertension a. Continue amlodipine  6. Hyperlipidemia a. Continue statin  7. OSA a. Patient does not want to wear her CPAP  8. Leukocytosis of unclear etiology a. Follow-up UA when it is obtained, hold off antibiotics for now  9. Dehydration, more likely volume depletion, improved  DVT prophylaxis: SCDs Start: 08/15/20 2345   Family Communication: Patient's daughter has been updated   Disposition Plan:  Status is: Inpatient  Remains inpatient appropriate because:Unsafe d/c plan and Inpatient level of care appropriate due to severity of illness   Dispo: The patient is from: SNF              Anticipated d/c is to: SNF              Patient currently is not medically stable to d/c.   Difficult to place patient No           Pressure injury documentation   Pressure Injury 03/22/20 Coccyx Stage 2 -  Partial thickness loss of dermis presenting as a  shallow open injury with a red, pink wound bed without slough. (Active)  03/22/20 1200  Location: Coccyx  Location Orientation:   Staging: Stage 2 -  Partial thickness loss of dermis presenting as a shallow open injury with a red, pink wound bed without slough.  Wound Description (Comments):   Present on Admission: Yes     Pressure Injury 08/16/20 Ischial tuberosity Left Stage 4 - Full thickness tissue loss with exposed bone, tendon or muscle. foul odor round stage 4 with yellow slough (Active)  08/16/20 0100  Location: Ischial tuberosity  Location Orientation:  Left  Staging: Stage 4 - Full thickness tissue loss with exposed bone, tendon or muscle.  Wound Description (Comments): foul odor round stage 4 with yellow slough  Present on Admission: Yes     Consultants  None  Procedures  None  Antibiotics   Anti-infectives (From admission, onward)   None        Subjective   Patient seen and examined at bedside no acute distress resting comfortably.  She is a poor historian given her past medical history though she does not have any complaints at this time when asked.  No overnight events. Objective   Vitals:   08/17/20 2012 08/18/20 0430 08/18/20 0754 08/18/20 1356  BP: 126/62 136/65  (!) 141/69  Pulse: 90 84  88  Resp: 18   18  Temp: 98.7 F (37.1 C) 98.8 F (37.1 C)  98.9 F (37.2 C)  TempSrc: Oral Axillary  Oral  SpO2: 100% 94% 95% 100%    Intake/Output Summary (Last 24 hours) at 08/18/2020 1655 Last data filed at 08/18/2020 1637 Gross per 24 hour  Intake 1746.28 ml  Output 0 ml  Net 1746.28 ml   There were no vitals filed for this visit.  Examination:  Physical Exam Vitals and nursing note reviewed.  Constitutional:      General: She is not in acute distress.    Appearance: Normal appearance.  HENT:     Head: Normocephalic and atraumatic.  Eyes:     Conjunctiva/sclera: Conjunctivae normal.  Cardiovascular:     Rate and Rhythm: Normal rate and regular rhythm.  Pulmonary:     Effort: Pulmonary effort is normal.     Breath sounds: Normal breath sounds.  Abdominal:     General: Abdomen is flat.     Palpations: Abdomen is soft.  Musculoskeletal:        General: No swelling or tenderness.  Skin:    Coloration: Skin is not jaundiced or pale.  Neurological:     Mental Status: She is alert. Mental status is at baseline.  Psychiatric:        Mood and Affect: Mood normal.        Behavior: Behavior normal.     Data Reviewed: I have personally reviewed following labs and imaging studies  CBC: Recent Labs   Lab 08/15/20 1608 08/16/20 0459 08/17/20 1159  WBC 12.2* 12.8* 15.1*  NEUTROABS 8.8*  --  12.4*  HGB 8.6* 8.6* 8.6*  HCT 27.3* 27.7* 27.4*  MCV 95.8 95.2 94.2  PLT 422* 425* 454*   Basic Metabolic Panel: Recent Labs  Lab 08/15/20 1608 08/16/20 0459 08/16/20 1448 08/17/20 1159 08/18/20 0518  NA 147* 146* 143 138 137  K 3.0* 3.1* 3.5 3.6 4.2  CL 108 106 104 103 102  CO2 GLUCOSE 101* 84 102* 160* 91  BUN 7*  CREATININE 0.44 0.51 0.44 0.45  0.47  CALCIUM 8.3* 8.2* 8.4* 8.3* 8.1*  MG  --  1.6* 2.1 1.8 1.7  PHOS  --   --  2.7 2.3* 3.2   GFR: CrCl cannot be calculated (Unknown ideal weight.). Liver Function Tests: Recent Labs  Lab 08/15/20 1608 08/16/20 1448 08/17/20 1159 08/18/20 0518  AST 15  --   --   --   ALT 12  --   --   --   ALKPHOS 51  --   --   --   BILITOT 0.4  --   --   --   PROT 6.6  --   --   --   ALBUMIN 2.5* 2.4* 2.3* 2.2*   No results for input(s): LIPASE, AMYLASE in the last 168 hours. No results for input(s): AMMONIA in the last 168 hours. Coagulation Profile: Recent Labs  Lab 08/15/20 1608  INR 1.1   Cardiac Enzymes: No results for input(s): CKTOTAL, CKMB, CKMBINDEX, TROPONINI in the last 168 hours. BNP (last 3 results) No results for input(s): PROBNP in the last 8760 hours. HbA1C: Recent Labs    08/16/20 0459  HGBA1C 6.4*   CBG: Recent Labs  Lab 08/17/20 1606 08/17/20 2141 08/18/20 0739 08/18/20 1145 08/18/20 1611  GLUCAP 91 175* 93 187* 196*   Lipid Profile: No results for input(s): CHOL, HDL, LDLCALC, TRIG, CHOLHDL, LDLDIRECT in the last 72 hours. Thyroid Function Tests: Recent Labs    08/16/20 0459  TSH 1.204   Anemia Panel: Recent Labs    08/16/20 0459  VITAMINB12 1,219*  FOLATE 19.0  FERRITIN 185  TIBC 186*  IRON 16*  RETICCTPCT 1.1   Sepsis Labs: Recent Labs  Lab 08/15/20 1608 08/15/20 1758  LATICACIDVEN 1.3 0.9    Recent Results (from the past 240 hour(s))  Culture,  blood (routine x 2)     Status: None (Preliminary result)   Collection Time: 08/15/20  4:08 PM   Specimen: BLOOD LEFT HAND  Result Value Ref Range Status   Specimen Description BLOOD LEFT HAND  Final   Special Requests   Final    BOTTLES DRAWN AEROBIC AND ANAEROBIC Blood Culture adequate volume   Culture   Final    NO GROWTH 3 DAYS Performed at [facility], [address] [place], [place] [address]    Report Status PENDING  Incomplete  Resp Panel by RT-PCR (Flu A&B, Covid) Nasopharyngeal Swab     Status: None   Collection Time: 08/15/20  4:09 PM   Specimen: Nasopharyngeal Swab; Nasopharyngeal(NP) swabs in vial transport medium  Result Value Ref Range Status   SARS Coronavirus 2 by RT PCR NEGATIVE NEGATIVE Final    Comment: (NOTE) SARS-CoV-2 target nucleic acids are NOT DETECTED.  The SARS-CoV-2 RNA is generally detectable in upper respiratory specimens during the acute phase of infection. The lowest concentration of SARS-CoV-2 viral copies this assay can detect is 138 copies/mL. A negative result does not preclude SARS-Cov-2 infection and should not be used as the sole basis for treatment or other patient management decisions. A negative result may occur with  improper specimen collection/handling, submission of specimen other than nasopharyngeal swab, presence of viral mutation(s) within the areas targeted by this assay, and inadequate number of viral copies(<138 copies/mL). A negative result must be combined with clinical observations, patient history, and epidemiological information. The expected result is Negative.  Fact Sheet for Patients:  BloggerCourse.comhttps://www.fda.gov/media/152166/download  Fact Sheet for Healthcare Providers:  SeriousBroker.ithttps://www.fda.gov/media/152162/download  This test is no t yet approved or  cleared by the [place] and  has been authorized for detection and/or diagnosis of SARS-CoV-2 by FDA under an Emergency Use Authorization (EUA). This EUA will  remain  in effect (meaning this test can be used) for the duration of the COVID-19 declaration under Section 564(b)(1) of the Act, 21 U.S.C.section 360bbb-3(b)(1), unless the authorization is terminated  or revoked sooner.       Influenza A by PCR NEGATIVE NEGATIVE Final   Influenza B by PCR NEGATIVE NEGATIVE Final    Comment: (NOTE) The Xpert Xpress SARS-CoV-2/FLU/RSV plus assay is intended as an aid in the diagnosis of influenza from Nasopharyngeal swab specimens and should not be used as a sole basis for treatment. Nasal washings and aspirates are unacceptable for Xpert Xpress SARS-CoV-2/FLU/RSV testing.  Fact Sheet for Patients: BloggerCourse.com  Fact Sheet for Healthcare Providers: SeriousBroker.it  This test is not yet approved or cleared by the [place] FDA and has been authorized for detection and/or diagnosis of SARS-CoV-2 by FDA under an Emergency Use Authorization (EUA). This EUA will remain in effect (meaning this test can be used) for the duration of the COVID-19 declaration under Section 564(b)(1) of the Act, 21 U.S.C. section 360bbb-3(b)(1), unless the authorization is terminated or revoked.  Performed at [facility], [address] [place], [place] [address]   Culture, blood (routine x 2)     Status: None (Preliminary result)   Collection Time: 08/15/20  4:10 PM   Specimen: BLOOD RIGHT ARM  Result Value Ref Range Status   Specimen Description   Final    BLOOD RIGHT ARM Performed at [facility], [address], [place], [place] [address]    Special Requests   Final    BOTTLES DRAWN AEROBIC AND ANAEROBIC Blood Culture results may not be optimal due to an inadequate volume of blood received in culture bottles Performed at [facility], [address] [place], [place] [address]    Culture   Final    NO GROWTH 3 DAYS Performed at [facility],  [address], [place], [place] [address]    Report Status PENDING  Incomplete  MRSA PCR Screening     Status: None   Collection Time: 08/17/20 10:55 AM   Specimen: Nasal Mucosa; Nasopharyngeal  Result Value Ref Range Status   MRSA by PCR NEGATIVE NEGATIVE Final    Comment:        The GeneXpert MRSA Assay (FDA approved for NASAL specimens only), is one component of a comprehensive MRSA colonization surveillance program. It is not intended to diagnose MRSA infection nor to guide or monitor treatment for MRSA infections. Performed at [facility], [address], [place], [place] [address]          Radiology Studies: No results found.      Scheduled Meds: . (feeding supplement) PROSource Plus  30 mL Oral Daily  . amLODipine  5 mg Oral Daily  . atorvastatin  40 mg Oral q1800  . budesonide (PULMICORT) nebulizer solution  0.25 mg Nebulization BID  . collagenase   Topical Daily  . divalproex  250 mg Oral QHS  . feeding supplement  237 mL Oral BID BM  . insulin aspart  0-9 Units Subcutaneous TID WC  . metoprolol tartrate  50 mg Oral BID  . multivitamin with minerals  1 tablet Oral Daily  . nutrition supplement (JUVEN)  1 packet Oral BID BM   Continuous Infusions:   Time  spent: 27 minutes with over 50% of the time coordinating the patient's care    Jae Dire, DO [facility]   Call night coverage person covering after 7pm

## 2020-08-19 DIAGNOSIS — R1310 Dysphagia, unspecified: Secondary | ICD-10-CM | POA: Diagnosis not present

## 2020-08-19 DIAGNOSIS — R4189 Other symptoms and signs involving cognitive functions and awareness: Secondary | ICD-10-CM | POA: Diagnosis not present

## 2020-08-19 LAB — GLUCOSE, CAPILLARY
Glucose-Capillary: 92 mg/dL (ref 70–99)
Glucose-Capillary: 160 mg/dL — ABNORMAL HIGH (ref 70–99)
Glucose-Capillary: 111 mg/dL — ABNORMAL HIGH (ref 70–99)
Glucose-Capillary: 138 mg/dL — ABNORMAL HIGH (ref 70–99)

## 2020-08-19 LAB — CBC
HCT: 25 % — ABNORMAL LOW (ref 36.0–46.0)
Hemoglobin: 8.1 g/dL — ABNORMAL LOW (ref 12.0–15.0)
MCH: 30 pg (ref 26.0–34.0)
MCHC: 32.4 g/dL (ref 30.0–36.0)
MCV: 92.6 fL (ref 80.0–100.0)
Platelets: 420 10*3/uL — ABNORMAL HIGH (ref 150–400)
RBC: 2.7 MIL/uL — ABNORMAL LOW (ref 3.87–5.11)
RDW: 13.2 % (ref 11.5–15.5)
WBC: 12.6 10*3/uL — ABNORMAL HIGH (ref 4.0–10.5)
nRBC: 0 % (ref 0.0–0.2)

## 2020-08-19 LAB — BASIC METABOLIC PANEL
Anion gap: 8 (ref 5–15)
BUN: 10 mg/dL (ref 8–23)
CO2: 28 mmol/L (ref 22–32)
Calcium: 8.2 mg/dL — ABNORMAL LOW (ref 8.9–10.3)
Chloride: 99 mmol/L (ref 98–111)
Creatinine, Ser: 0.41 mg/dL — ABNORMAL LOW (ref 0.44–1.00)
GFR, Estimated: >60 mL/min (ref >60)
Glucose, Bld: 113 mg/dL — ABNORMAL HIGH (ref 70–99)
Potassium: 3.7 mmol/L (ref 3.5–5.1)
Sodium: 135 mmol/L (ref 135–145)

## 2020-08-19 LAB — URINALYSIS, ROUTINE W REFLEX MICROSCOPIC
Bilirubin Urine: NEGATIVE
Glucose, UA: NEGATIVE mg/dL
Hgb urine dipstick: NEGATIVE
Ketones, ur: NEGATIVE mg/dL
Leukocytes,Ua: NEGATIVE
Nitrite: NEGATIVE
Protein, ur: NEGATIVE mg/dL
Specific Gravity, Urine: 1.014 (ref 1.005–1.030)
pH: 6 (ref 5.0–8.0)

## 2020-08-19 MED ORDER — NAPHAZOLINE-GLYCERIN 0.012-0.2 % OP SOLN
1.0000 [drp] | Freq: Four times a day (QID) | OPHTHALMIC | Status: DC | PRN
  Filled 2020-08-19: qty 15

## 2020-08-19 NOTE — Progress Notes (Signed)
PROGRESS NOTE    Brenda Riggs    Code Status: Full Code  ZOX:[id number] DOB: 01/14/1956 DOA: 08/15/2020 LOS: 2 days  PCP: Jackie Plumsei-Bonsu, George, MD CC:  Chief Complaint  Patient presents with  . 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  At baseline, she is nonambulatory with severe dysarthria and dysphagia however this was worsened prompting her to come to the ED.  ED course: Afebrile, tolerating room air with stable BP.  Head CT with 5 mm focus of hyperdensity at the pons at the site of prior acute parenchymal hemorrhage in November which was further assessed by brain MRI but the lesion was thought to be either a calcification or acute hemorrhage.  The ED discussed with neurology who felt the radiographic findings were less likely to affect an acute hemorrhage and agreed with a repeat CT in 12 to 24 hours.  Labs positive for K3.0, WBC 12.2, platelets 172, Hb 8.6 down from 11.4 November.  UA was ordered but not obtained also, her fluphenazine was held.    A & P   Principal Problem:   Decreased level of consciousness Active Problems:   Type II diabetes mellitus (HCC)   Paranoid schizophrenia (HCC)   Essential hypertension, benign   Asthma   OSA (obstructive sleep apnea)   Pressure injury of skin   History of hemorrhagic cerebrovascular accident (CVA) with residual deficit   Hypokalemia   Dysphagia   Acute encephalopathy   1. Altered mental status, suspect acute metabolic encephalopathy unknown etiology, stable a. Seems at/near baseline currently b. Was on gabapentin and Norco prior to arrival which are on hold and likely contributed to her symptoms c. Initial head CT with possible hyperdensity of the pons and MRI was  indeterminate d. Repeat head CT was negative for bleed e. She received IV fluids since admit f. holding fluphenazine g. UA ordered but not obtained  2. Acute on chronic anemia secondary to anemia of chronic disease, stable a. Continue to monitor for now without any blood transfusion b. Transfuse if Hb less than 7.0 or symptomatic  3. Schizophrenia  dementia, at baseline a. Continue holding fluphenazine as this may have been contributing to AMS  4. Dysphagia a. Likely at her baseline from prior stroke b. SLP recommending MBS this hospitalization, unable to be done today and will try again tomorrow  5. Hypertension, stable a. Continue amlodipine  6. Hyperlipidemia a. Continue statin  7. OSA a. Patient does not want to wear her CPAP  8. Leukocytosis of unclear etiology a. Follow-up UA when it is obtained, hold off antibiotics for now  9. Dehydration, more likely volume depletion, improved  DVT prophylaxis: SCDs Start: 08/15/20 2345   Family Communication: Patient's daughter has been updated yesterday  Disposition Plan: pending MBS and SLP recommendations. Likely DC in 24 - 48 hours if all else stable Status is: Inpatient  Remains inpatient appropriate because:Unsafe d/c plan and Inpatient level of care appropriate due to severity of illness   Dispo: The patient is from: SNF              Anticipated d/c is to: SNF              Patient currently is not medically stable to d/c.  Difficult to place patient No           Pressure injury documentation   Pressure Injury 03/22/20 Coccyx Stage 2 -  Partial thickness loss of dermis presenting as a shallow open injury with a red, pink wound bed without slough. (Active)  03/22/20 1200  Location: Coccyx  Location Orientation:   Staging: Stage 2 -  Partial thickness loss of dermis presenting as a shallow open injury with a red, pink wound bed without slough.  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 foul odor round stage 4 with yellow slough (Active)  08/16/20 0100  Location: Ischial tuberosity  Location Orientation: Left  Staging: Stage 4 - Full thickness tissue loss with exposed bone, tendon or muscle.  Wound Description (Comments): foul odor round stage 4 with yellow slough  Present on Admission: Yes     Consultants  None  Procedures  None  Antibiotics   Anti-infectives (From admission, onward)   None        Subjective   Patient seen and examined at bedside in no acute distress and resting comfortably. No complaints and no overnight events.  Objective   Vitals:   08/18/20 2008 08/18/20 2300 08/19/20 0444 08/19/20 0807  BP: (!) 152/80  (!) 121/59   Pulse: 96  90   Resp: 18 16 16    Temp: 99.5 F (37.5 C)  98.3 F (36.8 C)   TempSrc: Oral  Oral   SpO2: 100%  100% 98%  Weight:        Intake/Output Summary (Last 24 hours) at 08/19/2020 1043 Last data filed at 08/19/2020 0900 Gross per 24 hour  Intake 240 ml  Output --  Net 240 ml   Filed Weights   08/18/20 1736  Weight: 54 kg    Examination:  Physical Exam Vitals and nursing note reviewed.  Constitutional:      Appearance: Normal appearance.  HENT:     Head: Normocephalic and atraumatic.  Eyes:     Conjunctiva/sclera: Conjunctivae normal.  Cardiovascular:     Rate and Rhythm: Normal rate and regular rhythm.  Pulmonary:     Effort: Pulmonary effort is normal.     Breath sounds: Normal breath sounds.  Abdominal:     General: Abdomen is flat.     Palpations: Abdomen is soft.  Musculoskeletal:        General: No swelling or tenderness.  Skin:    Coloration: Skin is not jaundiced or pale.  Neurological:     Mental Status: She is alert and oriented to person, place, and time. Mental status is at baseline.     Comments: Slowed speech with some word finding difficulty   Psychiatric:        Mood and Affect: Mood normal.        Behavior: Behavior normal.     Data Reviewed: I have personally reviewed following labs and imaging studies  CBC: Recent Labs  Lab 08/15/20 1608 08/16/20 0459 08/17/20 1159 08/19/20 0436  WBC 12.2* 12.8* 15.1* 12.6*  NEUTROABS 8.8*  --  12.4*  --   HGB 8.6* 8.6* 8.6* 8.1*  HCT 27.3* 27.7* 27.4* 25.0*  MCV 95.8 95.2 94.2 92.6  PLT 422* 425* 454* 420*   Basic Metabolic Panel: Recent Labs  Lab 08/16/20 0459 08/16/20 1448 08/17/20 1159 08/18/20 0518 08/19/20 0436  NA 146* 143  138 137 135  K 3.1* 3.5 3.6 4.2 3.7  CL 106 104 103 102 99  CO2 29 29 28 28 28   GLUCOSE 84 102* 160* 91 113*  BUN 13 10 12  7* 10  CREATININE 0.51 0.44 0.45 0.47 0.41*  CALCIUM 8.2* 8.4* 8.3* 8.1* 8.2*  MG 1.6* 2.1 1.8 1.7  --   PHOS  --  2.7 2.3* 3.2  --    GFR: CrCl cannot be calculated (Unknown ideal weight.). Liver Function Tests: Recent Labs  Lab 08/15/20 1608 08/16/20 1448 08/17/20 1159 08/18/20 0518  AST 15  --   --   --   ALT 12  --   --   --   ALKPHOS 51  --   --   --   BILITOT 0.4  --   --   --   PROT 6.6  --   --   --   ALBUMIN 2.5* 2.4* 2.3* 2.2*   No results for input(s): LIPASE, AMYLASE in the last 168 hours. No results for input(s): AMMONIA in the last 168 hours. Coagulation Profile: Recent Labs  Lab 08/15/20 1608  INR 1.1   Cardiac Enzymes: No results for input(s): CKTOTAL, CKMB, CKMBINDEX, TROPONINI in the last 168 hours. BNP (last 3 results) No results for input(s): PROBNP in the last 8760 hours. HbA1C: No results for input(s): HGBA1C in the last 72 hours. CBG: Recent Labs  Lab 08/18/20 0739 08/18/20 1145 08/18/20 1611 08/18/20 2006 08/19/20 0742  GLUCAP 93 187* 196* 117* 92   Lipid Profile: No results for input(s): CHOL, HDL, LDLCALC, TRIG, CHOLHDL, LDLDIRECT in the last 72 hours. Thyroid Function Tests: No results for input(s): TSH, T4TOTAL, FREET4, T3FREE, THYROIDAB in the last 72  hours. Anemia Panel: No results for input(s): VITAMINB12, FOLATE, FERRITIN, TIBC, IRON, RETICCTPCT in the last 72 hours. Sepsis Labs: Recent Labs  Lab 08/15/20 1608 08/15/20 1758  LATICACIDVEN 1.3 0.9    Recent Results (from the past 240 hour(s))  Culture, blood (routine x 2)     Status: None (Preliminary result)   Collection Time: 08/15/20  4:08 PM   Specimen: BLOOD LEFT HAND  Result Value Ref Range Status   Specimen Description BLOOD LEFT HAND  Final   Special Requests   Final    BOTTLES DRAWN AEROBIC AND ANAEROBIC Blood Culture adequate volume   Culture   Final    NO GROWTH 4 DAYS Performed at [facility], [address], [place], [address] [place]    Report Status PENDING  Incomplete  Resp Panel by RT-PCR (Flu A&B, Covid) Nasopharyngeal Swab     Status: None   Collection Time: 08/15/20  4:09 PM   Specimen: Nasopharyngeal Swab; Nasopharyngeal(NP) swabs in vial transport medium  Result Value Ref Range Status   SARS Coronavirus 2 by RT PCR NEGATIVE NEGATIVE Final    Comment: (NOTE) SARS-CoV-2 target nucleic acids are NOT DETECTED.  The SARS-CoV-2 RNA is generally detectable in upper respiratory specimens during the acute phase of infection. The lowest concentration of SARS-CoV-2 viral copies this assay can detect is 138 copies/mL. A negative result does not preclude SARS-Cov-2 infection and should not be used as the sole basis for treatment or other patient management decisions. A negative result may occur with  improper specimen collection/handling, submission of specimen other than nasopharyngeal swab, presence of viral mutation(s) within the areas targeted by this assay, and inadequate number of viral copies(<138 copies/mL). A negative result must be combined with clinical observations, patient history,  and epidemiological information. The expected result is Negative.  Fact Sheet for Patients:  BloggerCourse.com  Fact Sheet for  Healthcare Providers:  SeriousBroker.it  This test is no t yet approved or cleared by the [place] FDA and  has been authorized for detection and/or diagnosis of SARS-CoV-2 by FDA under an Emergency Use Authorization (EUA). This EUA will remain  in effect (meaning this test can be used) for the duration of the COVID-19 declaration under Section 564(b)(1) of the Act, 21 U.S.C.section 360bbb-3(b)(1), unless the authorization is terminated  or revoked sooner.       Influenza A by PCR NEGATIVE NEGATIVE Final   Influenza B by PCR NEGATIVE NEGATIVE Final    Comment: (NOTE) The Xpert Xpress SARS-CoV-2/FLU/RSV plus assay is intended as an aid in the diagnosis of influenza from Nasopharyngeal swab specimens and should not be used as a sole basis for treatment. Nasal washings and aspirates are unacceptable for Xpert Xpress SARS-CoV-2/FLU/RSV testing.  Fact Sheet for Patients: BloggerCourse.com  Fact Sheet for Healthcare Providers: SeriousBroker.it  This test is not yet approved or cleared by the [place] FDA and has been authorized for detection and/or diagnosis of SARS-CoV-2 by FDA under an Emergency Use Authorization (EUA). This EUA will remain in effect (meaning this test can be used) for the duration of the COVID-19 declaration under Section 564(b)(1) of the Act, 21 U.S.C. section 360bbb-3(b)(1), unless the authorization is terminated or revoked.  Performed at [facility], [address], [place], [place] [address]   Culture, blood (routine x 2)     Status: None (Preliminary result)   Collection Time: 08/15/20  4:10 PM   Specimen: BLOOD RIGHT ARM  Result Value Ref Range Status   Specimen Description   Final    BLOOD RIGHT ARM Performed at [facility], [address], [place], [place] [address]    Special Requests   Final    BOTTLES DRAWN AEROBIC AND ANAEROBIC Blood  Culture results may not be optimal due to an inadequate volume of blood received in culture bottles Performed at [facility], [address], [place], [place] [address]    Culture   Final    NO GROWTH 4 DAYS Performed at [facility], [address], [place], [place] [address]    Report Status PENDING  Incomplete  MRSA PCR Screening     Status: None   Collection Time: 08/17/20 10:55 AM   Specimen: Nasal Mucosa; Nasopharyngeal  Result Value Ref Range Status   MRSA by PCR NEGATIVE NEGATIVE Final    Comment:        The GeneXpert MRSA Assay (FDA approved for NASAL specimens only), is one component of a comprehensive MRSA colonization surveillance program. It is not intended to diagnose MRSA infection nor to guide or monitor treatment for MRSA infections. Performed at [facility], [address] [place], [place] [address]          Radiology Studies: No results found.      Scheduled Meds: . (feeding supplement) PROSource Plus  30 mL Oral Daily  . amLODipine  5 mg Oral Daily  . atorvastatin  40 mg Oral q1800  . budesonide (PULMICORT) nebulizer solution  0.25 mg Nebulization BID  . collagenase   Topical Daily  . divalproex  250 mg Oral QHS  . feeding supplement  237 mL Oral BID BM  . insulin aspart  0-9 Units Subcutaneous TID WC  . metoprolol tartrate  50 mg Oral BID  . multivitamin with minerals  1 tablet Oral Daily  . nutrition supplement (JUVEN)  1 packet Oral BID BM   Continuous Infusions:   Time spent: 25 minutes with over 50% of the time coordinating the patient's care    Jae Dire, DO [facility]   Call night coverage person covering after 7pm

## 2020-08-19 NOTE — NC FL2 (Signed)
[place] MEDICAID FL2 LEVEL OF CARE SCREENING TOOL     IDENTIFICATION  Patient Name: Brenda Riggs Birthdate: Sep 23, 1955 Sex: female Admission Date (Current Location): 08/15/2020  [facility] and [place] Number:  Producer, television/film/video and Address:  [facility],  [address] [place], [place] [address]      Provider Number: [id number]  Attending Physician Name and Address:  Jae Dire, MD  Relative Name and Phone Number:  Junious Dresser dtr [phone]    Current Level of Care: Hospital Recommended Level of Care: Nursing Facility Prior Approval Number:    Date Approved/Denied:   PASRR Number:    Discharge Plan: Other (Comment) (LTC)    Current Diagnoses: Patient Active Problem List   Diagnosis Date Noted  . Acute encephalopathy 08/17/2020  . Dysphagia 08/16/2020  . Decreased level of consciousness 08/15/2020  . History of hemorrhagic cerebrovascular accident (CVA) with residual deficit 08/15/2020  . Hypokalemia 08/15/2020  . Thyroid enlargement 03/25/2020  . Laceration of forehead 03/25/2020  . Pressure injury of skin 03/23/2020  . 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 OSA (obstructive sleep apnea) 07/01/2014  . Cigarette smoker 06/28/2014  . Wheezing 06/27/2014  . MENOPAUSE, SURGICAL 09/09/2009  . INSECT BITE 12/19/2008  . Essential hypertension, benign 09/26/2008  . Type II diabetes mellitus (HCC) 11/02/2006  . Paranoid schizophrenia (HCC) 11/02/2006  . TOBACCO USER 11/02/2006  . MENTAL RETARDATION, MILD 11/02/2006  . Asthma 11/02/2006  . HIDRADENITIS SUPPURATIVA 11/02/2006  . Hyperlipidemia 12/21/2004  . 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 STROKE 10/06/2004  . ATAXIA 08/21/2004  . SYMPTOM, HYPERSOMNIA W/SLEEP APNEA NOS 11/09/1999  . BREAST MASS, LEFT 08/18/1998    Orientation RESPIRATION BLADDER Height & Weight     Self  Normal Incontinent Weight: 54 kg (per bed scale) Height:     BEHAVIORAL  SYMPTOMS/MOOD NEUROLOGICAL BOWEL NUTRITION STATUS      Incontinent Diet (dysphagia)  AMBULATORY STATUS COMMUNICATION OF NEEDS Skin   Total Care Verbally PU Stage and Appropriate Care (Sacral wound)       PU Stage 4 Dressing: Daily (NS,clean dry,santyl ointment,dry dsg daily.)               Personal Care Assistance Level of Assistance  Bathing,Feeding,Dressing,Total care Bathing Assistance: Maximum assistance Feeding assistance: Maximum assistance Dressing Assistance: Maximum assistance Total Care Assistance: Maximum assistance   Functional Limitations Info  Sight,Hearing,Speech Sight Info: Adequate Hearing Info: Adequate Speech Info: Impaired (dysphagia 1)    SPECIAL CARE FACTORS FREQUENCY                       Contractures Contractures Info: Not present    Additional Factors Info  Code Status,Allergies,Insulin Sliding Scale Code Status Info:  (full) Allergies Info:  (Pencillins;Thioridazine Hcl)   Insulin Sliding Scale Info:  (SSI)       Current Medications (08/19/2020):  This is the current hospital active medication list Current Facility-Administered Medications  Medication Dose Route Frequency Provider Last Rate Last Admin  . (feeding supplement) PROSource Plus liquid 30 mL  30 mL Oral Daily Jae Dire, MD   30 mL at 08/18/20 1415  . acetaminophen (TYLENOL) tablet 650 mg  650 mg Oral Q6H PRN Opyd, Lavone Neri, MD   650 mg at 08/17/20 1727   Or  . acetaminophen (TYLENOL) suppository 650 mg  650 mg Rectal Q6H PRN Opyd, Lavone Neri, MD      . albuterol (VENTOLIN HFA) 108 (90  Base) MCG/ACT inhaler 2 puff  2 puff Inhalation QID PRN Opyd, Lavone Neri, MD      . alum & mag hydroxide-simeth (MAALOX/MYLANTA) 200-200-20 MG/5ML suspension 30 mL  30 mL Oral Once PRN Opyd, Lavone Neri, MD      . amLODipine (NORVASC) tablet 5 mg  5 mg Oral Daily Opyd, Lavone Neri, MD   5 mg at 08/19/20 0917  . atorvastatin (LIPITOR) tablet 40 mg  40 mg Oral q1800 Opyd, Lavone Neri, MD   40 mg at  08/18/20 1714  . bisacodyl (DULCOLAX) EC tablet 5 mg  5 mg Oral Daily PRN Opyd, Lavone Neri, MD      . budesonide (PULMICORT) nebulizer solution 0.25 mg  0.25 mg Nebulization BID Opyd, Lavone Neri, MD   0.25 mg at 08/19/20 0806  . collagenase (SANTYL) ointment   Topical Daily Barnetta Chapel, MD   Given at 08/19/20 [phone]  . divalproex (DEPAKOTE ER) 24 hr tablet 250 mg  250 mg Oral QHS Opyd, Lavone Neri, MD   250 mg at 08/18/20 2109  . feeding supplement (ENSURE ENLIVE / ENSURE PLUS) liquid 237 mL  237 mL Oral BID BM Jae Dire, MD   237 mL at 08/19/20 0820  . guaiFENesin (ROBITUSSIN) 100 MG/5ML solution 200 mg  200 mg Oral Q6H PRN Opyd, Lavone Neri, MD      . insulin aspart (novoLOG) injection 0-9 Units  0-9 Units Subcutaneous TID WC Opyd, Lavone Neri, MD   2 Units at 08/18/20 1716  . metoprolol tartrate (LOPRESSOR) tablet 50 mg  50 mg Oral BID Opyd, Lavone Neri, MD   50 mg at 08/19/20 5449  . multivitamin with minerals tablet 1 tablet  1 tablet Oral Daily Jae Dire, MD   1 tablet at 08/19/20 2010  . naphazoline-glycerin (CLEAR EYES REDNESS) ophth solution 1-2 drop  1-2 drop Left Eye QID PRN Jae Dire, MD      . nutrition supplement [employer]) (JUVEN) powder packet 1 packet  1 packet Oral BID BM Jae Dire, MD   1 packet at 08/18/20 2109  . ondansetron (ZOFRAN) tablet 4 mg  4 mg Oral Q6H PRN Opyd, Lavone Neri, MD       Or  . ondansetron (ZOFRAN) injection 4 mg  4 mg Intravenous Q6H PRN Opyd, Lavone Neri, MD      . polyethylene glycol (MIRALAX / GLYCOLAX) packet 17 g  17 g Oral Daily PRN Opyd, Lavone Neri, MD         Discharge Medications: Please see discharge summary for a list of discharge medications.  Relevant Imaging Results:  Relevant Lab Results:   Additional Information SS#: [id number]  Willadene Mounsey, [place]

## 2020-08-19 NOTE — Progress Notes (Signed)
Patient continues to decline nocturnal CPAP. Equipment remains at bedside.

## 2020-08-19 NOTE — Progress Notes (Signed)
SLP Cancellation Note  Patient Details Name: Brenda Riggs MRN: [id number] DOB: 1955-12-22   Cancelled treatment:       Reason Eval/Treat Not Completed: Other (comment);Fatigue/lethargy limiting ability to participate (attempted for MBS this am but pt is not adequately alert, will reattempt next date am.)  Brenda Infante, MS [facility] SLP Acute Rehab Services Office [phone] Pager [phone]   Chales Abrahams 08/19/2020, 8:45 AM

## 2020-08-20 ENCOUNTER — Inpatient Hospital Stay (HOSPITAL_COMMUNITY): Payer: Medicaid Other

## 2020-08-20 DIAGNOSIS — R4189 Other symptoms and signs involving cognitive functions and awareness: Secondary | ICD-10-CM | POA: Diagnosis not present

## 2020-08-20 LAB — GLUCOSE, CAPILLARY
Glucose-Capillary: 98 mg/dL (ref 70–99)
Glucose-Capillary: 117 mg/dL — ABNORMAL HIGH (ref 70–99)
Glucose-Capillary: 102 mg/dL — ABNORMAL HIGH (ref 70–99)

## 2020-08-20 LAB — CULTURE, BLOOD (ROUTINE X 2)
Culture: NO GROWTH
Culture: NO GROWTH
Special Requests: ADEQUATE

## 2020-08-20 LAB — RESP PANEL BY RT-PCR (FLU A&B, COVID) ARPGX2
Influenza A by PCR: NEGATIVE
Influenza B by PCR: NEGATIVE
SARS Coronavirus 2 by RT PCR: NEGATIVE

## 2020-08-20 NOTE — Progress Notes (Addendum)
PTAR arrived to pick up patient to take to [facility]. Patient was alert and stable when patient was discharged. Nurse called patient's daughter/POA to let her know that PTAR had arrived to pick patient up so she can meet patient at [facility] when she gets there. Report was given to nurse at [facility] by day shift nurse, and discharge paperwork was completed.

## 2020-08-20 NOTE — Progress Notes (Signed)
Pt's daughter notified by H.Evans, RN that PTAR here to get patient. Brenda Riggs

## 2020-08-20 NOTE — TOC Transition Note (Addendum)
Transition of Care [facility]) - CM/SW Discharge Note   Patient Details  Name: Brenda Riggs MRN: [id number] Date of Birth: 1956/01/01  Transition of Care [facility]) CM/SW Contact:  Brenda Clam, RN Phone Number: 08/20/2020, 1:35 PM   Clinical Narrative:  D/c back to [facility] LTC-covid results neg;rm#126,nsg call report tel#[phone]. PTAR called. No further CM needs. 2p-rm#change 211E.    Final next level of care: Long Term Nursing Home Barriers to Discharge: No Barriers Identified   Patient Goals and CMS Choice Patient states their goals for this hospitalization and ongoing recovery are:: return back to LTC [name]      Discharge Placement              Patient chooses bed at: [name] Patient to be transferred to facility by: PTAR Name of family member notifiedDyann Ruddle dtr [phone] Patient and family notified of of transfer: 08/20/20  Discharge Plan and Services                                     Social Determinants of Health (SDOH) Interventions     Readmission Risk Interventions No flowsheet data found.

## 2020-08-20 NOTE — Progress Notes (Signed)
Lanier Clam, RN made RN aware that Patient  Room changed to 211E

## 2020-08-20 NOTE — Progress Notes (Signed)
Report giving to receiving nurse AMY at receiving facility (report tel#[phone]) PTAR has been called by CM/SW Olegario Messier. Patient will be  going to rm [id number] call    Pt currently resting, NAD. Belonging have been packed by NT.   Rn will continue to monitor pt.

## 2020-08-20 NOTE — Progress Notes (Signed)
Modified Barium Swallow Progress Note  Patient Details  Name: Brenda Riggs MRN: [id number] Date of Birth: 25-Aug-1955  Today's Date: 08/20/2020  Modified Barium Swallow completed.  Full report located under Chart Review in the Imaging Section.  Brief recommendations include the following:  Clinical Impression  Patient presents with severe oral and mild pharyngeal dysphagia mostly c/b decreased oral control from significant weakness.  Weakness and motor planning deficits result in delayed oral transiting, secretion retention, anterior loss of liquids, premature spillage of boluses into pharynx and oral residuals.  SLP provided oral suction before, during and after MBS to help with secretions.  Use of straw on right labial region faciliated more efficent pharyngeal reflex.  Pharyngeal swallow is delayed to pyriform sinuses with liquids - pooling for up to 2 seconds prior to swallow trigger. Trace laryngeal penetration of thin mixed with secretions.  No significant aspiration was observed - view was challenging due to limitations with positioning.  Oral residuals were difficult for pt to transit despite verbal cues including counting to help automaticity of swallow.  Once residuals transited into pharynx, pt's swallow reflex was significantly delayed.  Minimal retention of barium and secretions noted in pharynx without pt sensation.  This level of dysphagia likely contributes greatly to pt's poor nutrition. Recommend maximize liquid nutrition, follow up with [facility] at [facility], oral suction set up for use before, during and after po at facility.   Recommend contiue creamy puree foods for institutionalized feeding and work with SlP with solids - placing solids on right oral cavity for mastication efficiency.  Upon esophgeal sweep, pt appeared largely clear with appearance of minimal amount of residual.  Positioning for safe po will be necessary given her dysphagia.   Swallow Evaluation Recommendations       SLP  Diet Recommendations: Dysphagia 1 (Puree) solids;Thin liquid (maximize liquid nutrition)   Liquid Administration via: Straw   Medication Administration: Whole meds with puree   Supervision: Patient able to self feed   Compensations: Minimize environmental distractions;Slow rate;Small sips/bites (oral suction before, during and after po)       Oral Care Recommendations: Oral care before and after PO      Rolena Infante, MS [facility] SLP Acute Rehab Services Office [phone] Pager [phone]    Chales Abrahams 08/20/2020,9:39 AM

## 2020-08-20 NOTE — Discharge Summary (Signed)
Physician Discharge Summary  Brenda Riggs WYO:[id number] DOB: 01/09/1956   PCP: [name], MD  Admit date: 08/15/2020 Discharge date: 08/20/2020 Length of Stay: 3 days   Code Status: Full Code  Admitted From: SNF Discharged to:  SNF Discharge Condition: Stable  Recommendations for Outpatient Follow-up   1. Hold sedating medications as able and outlined below 2. Recommend maximize liquid nutrition, follow up with SlP at SNF, oral suction set up for use before, during and after po at facility. Recommend contiue creamy puree foods for institutionalized feeding and work with SlP with solids - placing solids on right oral cavity for mastication efficiency.    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  At baseline, she is nonambulatory with severe dysarthria and dysphagia however this was worsened prompting her to come to the ED.  ED course: Afebrile, tolerating room air with stable BP.  Head CT with 5 mm focus of hyperdensity at the pons at the site of prior acute parenchymal hemorrhage in November which was further assessed by brain MRI but the lesion was thought to be either a calcification or acute hemorrhage.  The ED discussed with neurology who felt the radiographic findings were less likely to affect an acute hemorrhage and agreed with a repeat CT in 12 to 24 hours.  Labs positive for K3.0, WBC 12.2, platelets 172, Hb 8.6 down from 11.4 November.  UA was ordered but not obtained also, her fluphenazine, gabapentin and Norco were held.  Repeat head CT was negative for bleed. She had significant improvement in her symptoms after holding sedating medications and with IV hydration and mentation seemed to return to baseline. She also underwent a modified barium  swallow study while inpatient at the advice of SLP.   The MBS showed: Pharyngeal swallow is delayed to pyriform sinuses with liquids - pooling for up to 2 seconds prior to swallow trigger. Trace laryngeal penetration of thin mixed with secretions.  No significant aspiration was observed - view was challenging due to limitations with positioning.  Oral residuals were difficult for pt to transit despite verbal cues including counting to help automaticity of swallow.  Once residuals transited into pharynx, pt's swallow reflex was significantly delayed.  Minimal retention of barium and secretions noted in pharynx without pt sensation.  This level of dysphagia likely contributes greatly to pt's poor nutrition. Recommend maximize liquid nutrition, follow up with SlP at SNF, oral suction set up for use before, during and after po at facility.   Recommend contiue creamy puree foods for institutionalized feeding and work with SlP with solids - placing solids on right oral cavity for mastication efficiency.    A & P   Principal Problem:   Decreased level of consciousness Active Problems:   Type II diabetes mellitus (HCC)   Paranoid schizophrenia (HCC)   Essential hypertension, benign   Asthma   OSA (obstructive sleep apnea)   Pressure injury of skin   History of hemorrhagic cerebrovascular accident (CVA) with residual deficit   Hypokalemia   Dysphagia   Acute encephalopathy     1. Altered mental status, suspect acute metabolic encephalopathy unknown etiology, resolved a. holding fluphenazine, gabapentin and Norco at Dc  2. Acute on chronic anemia secondary to anemia of chronic disease, stable a. Hb 11.4 in November  and now 8.6 during hospitalization without signs of bleeding b. Outpatient follow up  3. Schizophrenia  dementia, at baseline a. Continue holding fluphenazine as this may have been contributing to AMS b. Continue depakote   4. Dysphagia a. Likely at her baseline from prior  stroke b. MBS findings as above  5. Hypertension, stable a. Continue amlodipine  6. Hyperlipidemia a. Continue statin  7. OSA  8. Leukocytosis of unclear etiology a. Held off antibiotics for now as she did not have any signs of infection  9. Dehydration, more likely volume depletion, resolved     Consultants  . none  Procedures  . MBS  Antibiotics   Anti-infectives (From admission, onward)   None       Subjective  Patient seen and examined at bedside no acute distress and resting comfortably.  No events overnight.  Tolerating diet and eating breakfast during eval. In good spirits and anticipating discharge.   Denies any chest pain, shortness of breath. Otherwise ROS negative    Objective   Discharge Exam: Vitals:   08/20/20 0753 08/20/20 1255  BP:  122/63  Pulse:  88  Resp:  20  Temp:  100 F (37.8 C)  SpO2: 100% 100%   Vitals:   08/19/20 2102 08/20/20 0607 08/20/20 0753 08/20/20 1255  BP: 139/64 134/64  122/63  Pulse: (!) 108 90  88  Resp: 20 20  20   Temp: 100.1 F (37.8 C) 98.2 F (36.8 C)  100 F (37.8 C)  TempSrc:  Oral  Oral  SpO2: 94% 100% 100% 100%  Weight:        Physical Exam Vitals and nursing note reviewed. Exam conducted with a chaperone present.  Constitutional:      General: She is not in acute distress. HENT:     Head: Normocephalic.     Mouth/Throat:     Mouth: Mucous membranes are moist.  Eyes:     Conjunctiva/sclera: Conjunctivae normal.  Cardiovascular:     Rate and Rhythm: Normal rate and regular rhythm.  Pulmonary:     Effort: Pulmonary effort is normal.     Breath sounds: Normal breath sounds.  Abdominal:     General: Abdomen is flat. There is no distension.  Musculoskeletal:        General: No swelling or tenderness.  Neurological:     Mental Status: She is alert. Mental status is at baseline.       The results of significant diagnostics from this hospitalization (including imaging, microbiology,  ancillary and laboratory) are listed below for reference.     Microbiology: Recent Results (from the past 240 hour(s))  Culture, blood (routine x 2)     Status: None   Collection Time: 08/15/20  4:08 PM   Specimen: BLOOD LEFT HAND  Result Value Ref Range Status   Specimen Description BLOOD LEFT HAND  Final   Special Requests   Final    BOTTLES DRAWN AEROBIC AND ANAEROBIC Blood Culture adequate volume   Culture   Final    NO GROWTH 5 DAYS Performed at [facility], [address], [place], [place]    Report Status 08/20/2020 FINAL  Final  Resp Panel by RT-PCR (Flu A&B, Covid) Nasopharyngeal Swab     Status: None   Collection Time: 08/15/20  4:09 PM   Specimen: Nasopharyngeal Swab; Nasopharyngeal(NP) swabs in vial transport medium  Result Value Ref Range Status   SARS Coronavirus 2 by RT PCR NEGATIVE NEGATIVE Final  Comment: (NOTE) SARS-CoV-2 target nucleic acids are NOT DETECTED.  The SARS-CoV-2 RNA is generally detectable in upper respiratory specimens during the acute phase of infection. The lowest concentration of SARS-CoV-2 viral copies this assay can detect is 138 copies/mL. A negative result does not preclude SARS-Cov-2 infection and should not be used as the sole basis for treatment or other patient management decisions. A negative result may occur with  improper specimen collection/handling, submission of specimen other than nasopharyngeal swab, presence of viral mutation(s) within the areas targeted by this assay, and inadequate number of viral copies(<138 copies/mL). A negative result must be combined with clinical observations, patient history, and epidemiological information. The expected result is Negative.  Fact Sheet for Patients:  BloggerCourse.comhttps://www.fda.gov/media/152166/download  Fact Sheet for Healthcare Providers:  SeriousBroker.ithttps://www.fda.gov/media/152162/download  This test is no t yet approved or cleared by the [place] FDA and  has been  authorized for detection and/or diagnosis of SARS-CoV-2 by FDA under an Emergency Use Authorization (EUA). This EUA will remain  in effect (meaning this test can be used) for the duration of the COVID-19 declaration under Section 564(b)(1) of the Act, 21 U.S.C.section 360bbb-3(b)(1), unless the authorization is terminated  or revoked sooner.       Influenza A by PCR NEGATIVE NEGATIVE Final   Influenza B by PCR NEGATIVE NEGATIVE Final    Comment: (NOTE) The Xpert Xpress SARS-CoV-2/FLU/RSV plus assay is intended as an aid in the diagnosis of influenza from Nasopharyngeal swab specimens and should not be used as a sole basis for treatment. Nasal washings and aspirates are unacceptable for Xpert Xpress SARS-CoV-2/FLU/RSV testing.  Fact Sheet for Patients: BloggerCourse.comhttps://www.fda.gov/media/152166/download  Fact Sheet for Healthcare Providers: SeriousBroker.ithttps://www.fda.gov/media/152162/download  This test is not yet approved or cleared by the [place] FDA and has been authorized for detection and/or diagnosis of SARS-CoV-2 by FDA under an Emergency Use Authorization (EUA). This EUA will remain in effect (meaning this test can be used) for the duration of the COVID-19 declaration under Section 564(b)(1) of the Act, 21 U.S.C. section 360bbb-3(b)(1), unless the authorization is terminated or revoked.  Performed at [facility], [address] [place], [place] [address]   Culture, blood (routine x 2)     Status: None   Collection Time: 08/15/20  4:10 PM   Specimen: BLOOD RIGHT ARM  Result Value Ref Range Status   Specimen Description   Final    BLOOD RIGHT ARM Performed at [facility], [address] [place], [place] [address]    Special Requests   Final    BOTTLES DRAWN AEROBIC AND ANAEROBIC Blood Culture results may not be optimal due to an inadequate volume of blood received in culture bottles Performed at [facility], [address]  [place], [place] [address]    Culture   Final    NO GROWTH 5 DAYS Performed at [facility], [address] [place], [place] [address]    Report Status 08/20/2020 FINAL  Final  MRSA PCR Screening     Status: None   Collection Time: 08/17/20 10:55 AM   Specimen: Nasal Mucosa; Nasopharyngeal  Result Value Ref Range Status   MRSA by PCR NEGATIVE NEGATIVE Final    Comment:        The GeneXpert MRSA Assay (FDA approved for NASAL specimens only), is one component of a comprehensive MRSA colonization surveillance program. It is not intended to diagnose MRSA infection nor to guide or monitor treatment for MRSA infections. Performed at  [facility] [place], [address], [place], [place] [address]      Labs: BNP (last 3 results) No results for input(s): BNP in the last 8760 hours. Basic Metabolic Panel: Recent Labs  Lab 08/16/20 0459 08/16/20 1448 08/17/20 1159 08/18/20 0518 08/19/20 0436  NA 146* 143 138 137 135  K 3.1* 3.5 3.6 4.2 3.7  CL 106 104 103 102 99  CO2 GLUCOSE 84 102* 160* 91 113*  BUN 7* 10  CREATININE 0.51 0.44 0.45 0.47 0.41*  CALCIUM 8.2* 8.4* 8.3* 8.1* 8.2*  MG 1.6* 2.1 1.8 1.7  --   PHOS  --  2.7 2.3* 3.2  --    Liver Function Tests: Recent Labs  Lab 08/15/20 1608 08/16/20 1448 08/17/20 1159 08/18/20 0518  AST 15  --   --   --   ALT 12  --   --   --   ALKPHOS 51  --   --   --   BILITOT 0.4  --   --   --   PROT 6.6  --   --   --   ALBUMIN 2.5* 2.4* 2.3* 2.2*   No results for input(s): LIPASE, AMYLASE in the last 168 hours. No results for input(s): AMMONIA in the last 168 hours. CBC: Recent Labs  Lab 08/15/20 1608 08/16/20 0459 08/17/20 1159 08/19/20 0436  WBC 12.2* 12.8* 15.1* 12.6*  NEUTROABS 8.8*  --  12.4*  --   HGB 8.6* 8.6* 8.6* 8.1*  HCT 27.3* 27.7* 27.4* 25.0*  MCV 95.8 95.2 94.2 92.6  PLT 422* 425* 454* 420*   Cardiac Enzymes: No results for input(s): CKTOTAL, CKMB, CKMBINDEX,  TROPONINI in the last 168 hours. BNP: Invalid input(s): POCBNP CBG: Recent Labs  Lab 08/19/20 1148 08/19/20 1623 08/19/20 2058 08/20/20 0729 08/20/20 1109  GLUCAP 160* 111* 138* 98 117*   D-Dimer No results for input(s): DDIMER in the last 72 hours. Hgb A1c No results for input(s): HGBA1C in the last 72 hours. Lipid Profile No results for input(s): CHOL, HDL, LDLCALC, TRIG, CHOLHDL, LDLDIRECT in the last 72 hours. Thyroid function studies No results for input(s): TSH, T4TOTAL, T3FREE, THYROIDAB in the last 72 hours.  Invalid input(s): FREET3 Anemia work up No results for input(s): VITAMINB12, FOLATE, FERRITIN, TIBC, IRON, RETICCTPCT in the last 72 hours. Urinalysis    Component Value Date/Time   COLORURINE YELLOW 08/15/2020 1546   APPEARANCEUR CLOUDY (A) 08/15/2020 1546   LABSPEC 1.014 08/15/2020 1546   PHURINE 6.0 08/15/2020 1546   GLUCOSEU NEGATIVE 08/15/2020 1546   HGBUR NEGATIVE 08/15/2020 1546   HGBUR trace-intact 09/10/2009 1115   BILIRUBINUR NEGATIVE 08/15/2020 1546   KETONESUR NEGATIVE 08/15/2020 1546   PROTEINUR NEGATIVE 08/15/2020 1546   UROBILINOGEN 0.2 10/22/2009 2021   NITRITE NEGATIVE 08/15/2020 1546   LEUKOCYTESUR NEGATIVE 08/15/2020 1546   Sepsis Labs Invalid input(s): PROCALCITONIN,  WBC,  LACTICIDVEN Microbiology Recent Results (from the past 240 hour(s))  Culture, blood (routine x 2)     Status: None   Collection Time: 08/15/20  4:08 PM   Specimen: BLOOD LEFT HAND  Result Value Ref Range Status   Specimen Description BLOOD LEFT HAND  Final   Special Requests   Final    BOTTLES DRAWN AEROBIC AND ANAEROBIC Blood Culture adequate volume   Culture   Final    NO GROWTH 5 DAYS Performed at [facility], [address] [place], [place] [address]  Report Status 08/20/2020 FINAL  Final  Resp Panel by RT-PCR (Flu A&B, Covid) Nasopharyngeal Swab     Status: None   Collection Time: 08/15/20  4:09 PM   Specimen: Nasopharyngeal Swab;  Nasopharyngeal(NP) swabs in vial transport medium  Result Value Ref Range Status   SARS Coronavirus 2 by RT PCR NEGATIVE NEGATIVE Final    Comment: (NOTE) SARS-CoV-2 target nucleic acids are NOT DETECTED.  The SARS-CoV-2 RNA is generally detectable in upper respiratory specimens during the acute phase of infection. The lowest concentration of SARS-CoV-2 viral copies this assay can detect is 138 copies/mL. A negative result does not preclude SARS-Cov-2 infection and should not be used as the sole basis for treatment or other patient management decisions. A negative result may occur with  improper specimen collection/handling, submission of specimen other than nasopharyngeal swab, presence of viral mutation(s) within the areas targeted by this assay, and inadequate number of viral copies(<138 copies/mL). A negative result must be combined with clinical observations, patient history, and epidemiological information. The expected result is Negative.  Fact Sheet for Patients:  BloggerCourse.com  Fact Sheet for Healthcare Providers:  SeriousBroker.it  This test is no t yet approved or cleared by the [place] FDA and  has been authorized for detection and/or diagnosis of SARS-CoV-2 by FDA under an Emergency Use Authorization (EUA). This EUA will remain  in effect (meaning this test can be used) for the duration of the COVID-19 declaration under Section 564(b)(1) of the Act, 21 U.S.C.section 360bbb-3(b)(1), unless the authorization is terminated  or revoked sooner.       Influenza A by PCR NEGATIVE NEGATIVE Final   Influenza B by PCR NEGATIVE NEGATIVE Final    Comment: (NOTE) The Xpert Xpress SARS-CoV-2/FLU/RSV plus assay is intended as an aid in the diagnosis of influenza from Nasopharyngeal swab specimens and should not be used as a sole basis for treatment. Nasal washings and aspirates are unacceptable for Xpert Xpress  SARS-CoV-2/FLU/RSV testing.  Fact Sheet for Patients: BloggerCourse.com  Fact Sheet for Healthcare Providers: SeriousBroker.it  This test is not yet approved or cleared by the [place] FDA and has been authorized for detection and/or diagnosis of SARS-CoV-2 by FDA under an Emergency Use Authorization (EUA). This EUA will remain in effect (meaning this test can be used) for the duration of the COVID-19 declaration under Section 564(b)(1) of the Act, 21 U.S.C. section 360bbb-3(b)(1), unless the authorization is terminated or revoked.  Performed at [facility], [address] [place], [place] [address]   Culture, blood (routine x 2)     Status: None   Collection Time: 08/15/20  4:10 PM   Specimen: BLOOD RIGHT ARM  Result Value Ref Range Status   Specimen Description   Final    BLOOD RIGHT ARM Performed at [facility], [address], [place], [place] [address]    Special Requests   Final    BOTTLES DRAWN AEROBIC AND ANAEROBIC Blood Culture results may not be optimal due to an inadequate volume of blood received in culture bottles Performed at [facility], [address], [place], [place] [address]    Culture   Final    NO GROWTH 5 DAYS Performed at [facility], [address] [place], [place] [address]    Report Status 08/20/2020 FINAL  Final  MRSA PCR Screening     Status: None   Collection Time: 08/17/20 10:55 AM   Specimen: Nasal Mucosa; Nasopharyngeal  Result Value  Ref Range Status   MRSA by PCR NEGATIVE NEGATIVE Final    Comment:        The GeneXpert MRSA Assay (FDA approved for NASAL specimens only), is one component of a comprehensive MRSA colonization surveillance program. It is not intended to diagnose MRSA infection nor to guide or monitor treatment for MRSA infections. Performed at [facility], [address] [place],  [place] [address]     Discharge Instructions     Discharge Instructions    Discharge instructions   Complete by: As directed    It is thought that Ms Lymon's altered mental status was medication induced and likely exacerbated by volume depletion. And resolved with IV fluids and holding Fluphenazine, Gabapentin and Norco and were discontinued at discharge. She also had a modified barium swallow study which showed dyshpagia and moderate aspiration risk.   Upon Discharge: - Recommend maximize liquid nutrition, follow up with SlP at SNF, oral suction set up for use before, during and after po at facility.  Recommend contiue creamy puree foods for institutionalized feeding and work with SlP with solids - placing solids on right oral cavity for mastication efficiency.   - Continue holding gabapentin, norco and fluphenazine and any other sedating medications as necessary  Do not hesitate to return to the hospital if any significant change or worsening of symptoms.   Discharge wound care:   Complete by: As directed    1. Clean ischial wound with saline, pat dry. 2. Apply 1/4" thick layer of Santyl to the wound bed,  3. Fill with saline moist 4x4 gauze 4. Top with dry dressing and secure with tape.   Increase activity slowly   Complete by: As directed      Allergies as of 08/20/2020      Reactions   Penicillins Other (See Comments)   Per caregiver. Unknown reaction Did it involve swelling of the face/tongue/throat, SOB, or low BP? Unknown Did it involve sudden or severe rash/hives, skin peeling, or any reaction on the inside of your mouth or nose? Unknown Did you need to seek medical attention at a hospital or doctor's office? Unknown When did it last happen?unknown If all above answers are "NO", may proceed with cephalosporin use.   Thioridazine Hcl Other (See Comments)   Present in Epic; not noted on MAR      Medication List    STOP taking these medications   dimenhyDRINATE 50 MG  tablet Commonly known as: DRAMAMINE   fluPHENAZine 5 MG tablet Commonly known as: PROLIXIN   gabapentin 100 MG capsule Commonly known as: NEURONTIN   HYDROcodone-acetaminophen 5-325 MG tablet Commonly known as: NORCO/VICODIN     TAKE these medications   acetaminophen 325 MG tablet Commonly known as: TYLENOL Take 650 mg by mouth every 6 (six) hours as needed for headache (minor discomfort/fever up to 100 degrees orally).   albuterol 108 (90 Base) MCG/ACT inhaler Commonly known as: VENTOLIN HFA Inhale 2 puffs into the lungs 4 (four) times daily as needed for wheezing or shortness of breath.   alum & mag hydroxide-simeth 200-200-20 MG/5ML suspension Commonly known as: MAALOX/MYLANTA Take 30 mLs by mouth once as needed for indigestion or heartburn.   amLODipine 5 MG tablet Commonly known as: NORVASC Take 1 tablet (5 mg total) by mouth daily.   atorvastatin 40 MG tablet Commonly known as: LIPITOR Take 1 tablet (40 mg total) by mouth daily at 6 PM.   cholecalciferol 25 MCG (1000 UNIT) tablet Commonly known  as: VITAMIN D3 Take 1,000 Units by mouth daily.   divalproex 250 MG 24 hr tablet Commonly known as: DEPAKOTE ER Take 250 mg by mouth at bedtime.   FLEET ENEMA RE Place 1 enema rectally daily as needed (constipation not relieved by MOM and prune juice).   fluticasone 50 MCG/ACT nasal spray Commonly known as: FLONASE Place 1 spray into both nostrils daily.   guaifenesin 100 MG/5ML syrup Commonly known as: ROBITUSSIN Take 200 mg by mouth every 6 (six) hours as needed for cough.   loperamide 2 MG tablet Commonly known as: IMODIUM A-D Take 2 mg by mouth 4 (four) times daily as needed for diarrhea or loose stools.   metFORMIN 500 MG tablet Commonly known as: GLUCOPHAGE Take 500 mg by mouth every 12 (twelve) hours.   metoprolol tartrate 50 MG tablet Commonly known as: LOPRESSOR Take 1 tablet (50 mg total) by mouth 2 (two) times daily.   Milk of Magnesia 400  MG/5ML suspension Generic drug: magnesium hydroxide Take 22.5 mLs by mouth every 8 (eight) hours as needed (constipation (if no relief in 8 hours give with 6 oz prune juice)).   NUTRITIONAL SUPPLEMENT PO Take 1 each by mouth 3 (three) times daily with meals.   PROSTAT PO Take 30 Units by mouth in the morning and at bedtime.   Qvar RediHaler 40 MCG/ACT inhaler Generic drug: beclomethasone Inhale 2 puffs into the lungs 2 (two) times daily.   vitamin C 500 MG tablet Commonly known as: ASCORBIC ACID Take 500 mg by mouth daily.            Discharge Care Instructions  (From admission, onward)         Start     Ordered   08/20/20 0000  Discharge wound care:       Comments: 1. Clean ischial wound with saline, pat dry. 2. Apply 1/4" thick layer of Santyl to the wound bed,  3. Fill with saline moist 4x4 gauze 4. Top with dry dressing and secure with tape.   08/20/20 1305          Allergies  Allergen Reactions  . Penicillins Other (See Comments)    Per caregiver. Unknown reaction Did it involve swelling of the face/tongue/throat, SOB, or low BP? Unknown Did it involve sudden or severe rash/hives, skin peeling, or any reaction on the inside of your mouth or nose? Unknown Did you need to seek medical attention at a hospital or doctor's office? Unknown When did it last happen?unknown If all above answers are "NO", may proceed with cephalosporin use.  . Thioridazine Hcl Other (See Comments)    Present in Epic; not noted on MAR     Dispo: The patient is from: SNF              Anticipated d/c is to: SNF              Patient currently is medically stable to d/c.        Time coordinating discharge: Over 30 minutes   SIGNED:   Jae Dire, D.O. [facility] Pager: [phone]  08/20/2020, 1:05 PM

## 2020-08-20 NOTE — Plan of Care (Signed)

## 2020-08-21 NOTE — Plan of Care (Signed)

## 2020-08-27 ENCOUNTER — Other Ambulatory Visit: Payer: Self-pay

## 2020-08-27 ENCOUNTER — Ambulatory Visit (HOSPITAL_COMMUNITY): Admit: 2020-08-27 | Payer: Medicaid Other

## 2020-08-27 ENCOUNTER — Encounter (HOSPITAL_COMMUNITY): Payer: Self-pay

## 2022-09-28 ENCOUNTER — Emergency Department (HOSPITAL_COMMUNITY): Payer: Medicare Other

## 2022-09-28 ENCOUNTER — Inpatient Hospital Stay (HOSPITAL_COMMUNITY)
Admission: EM | Admit: 2022-09-28 | Discharge: 2022-10-11 | DRG: 309 | Disposition: A | Discharge status: Skilled Nursing Facility | Payer: Medicare Other | Attending: Internal Medicine | Admitting: Internal Medicine

## 2022-09-28 DIAGNOSIS — R1311 Dysphagia, oral phase: Secondary | ICD-10-CM | POA: Diagnosis present

## 2022-09-28 DIAGNOSIS — Z8673 Personal history of transient ischemic attack (TIA), and cerebral infarction without residual deficits: Secondary | ICD-10-CM

## 2022-09-28 DIAGNOSIS — D72829 Elevated white blood cell count, unspecified: Secondary | ICD-10-CM | POA: Diagnosis present

## 2022-09-28 DIAGNOSIS — Z7401 Bed confinement status: Secondary | ICD-10-CM

## 2022-09-28 DIAGNOSIS — I10 Essential (primary) hypertension: Secondary | ICD-10-CM | POA: Diagnosis not present

## 2022-09-28 DIAGNOSIS — R4701 Aphasia: Secondary | ICD-10-CM | POA: Diagnosis not present

## 2022-09-28 DIAGNOSIS — E440 Moderate protein-calorie malnutrition: Secondary | ICD-10-CM | POA: Diagnosis not present

## 2022-09-28 DIAGNOSIS — F17210 Nicotine dependence, cigarettes, uncomplicated: Secondary | ICD-10-CM | POA: Diagnosis not present

## 2022-09-28 DIAGNOSIS — K567 Ileus, unspecified: Secondary | ICD-10-CM | POA: Diagnosis not present

## 2022-09-28 DIAGNOSIS — I4891 Unspecified atrial fibrillation: Secondary | ICD-10-CM | POA: Diagnosis not present

## 2022-09-28 DIAGNOSIS — Z880 Allergy status to penicillin: Secondary | ICD-10-CM

## 2022-09-28 DIAGNOSIS — F209 Schizophrenia, unspecified: Secondary | ICD-10-CM | POA: Diagnosis not present

## 2022-09-28 DIAGNOSIS — I69391 Dysphagia following cerebral infarction: Secondary | ICD-10-CM

## 2022-09-28 DIAGNOSIS — F0390 Unspecified dementia without behavioral disturbance: Secondary | ICD-10-CM | POA: Diagnosis not present

## 2022-09-28 DIAGNOSIS — Z6823 Body mass index (BMI) 23.0-23.9, adult: Secondary | ICD-10-CM | POA: Diagnosis not present

## 2022-09-28 DIAGNOSIS — E876 Hypokalemia: Secondary | ICD-10-CM | POA: Diagnosis not present

## 2022-09-28 DIAGNOSIS — Z7984 Long term (current) use of oral hypoglycemic drugs: Secondary | ICD-10-CM | POA: Diagnosis not present

## 2022-09-28 DIAGNOSIS — R2990 Unspecified symptoms and signs involving the nervous system: Secondary | ICD-10-CM

## 2022-09-28 DIAGNOSIS — I480 Paroxysmal atrial fibrillation: Secondary | ICD-10-CM | POA: Diagnosis not present

## 2022-09-28 DIAGNOSIS — J4489 Other specified chronic obstructive pulmonary disease: Secondary | ICD-10-CM | POA: Diagnosis present

## 2022-09-28 DIAGNOSIS — I69354 Hemiplegia and hemiparesis following cerebral infarction affecting left non-dominant side: Secondary | ICD-10-CM

## 2022-09-28 DIAGNOSIS — D509 Iron deficiency anemia, unspecified: Secondary | ICD-10-CM | POA: Diagnosis not present

## 2022-09-28 DIAGNOSIS — I69322 Dysarthria following cerebral infarction: Secondary | ICD-10-CM

## 2022-09-28 DIAGNOSIS — N179 Acute kidney failure, unspecified: Secondary | ICD-10-CM | POA: Diagnosis not present

## 2022-09-28 DIAGNOSIS — Z66 Do not resuscitate: Secondary | ICD-10-CM | POA: Diagnosis not present

## 2022-09-28 DIAGNOSIS — E119 Type 2 diabetes mellitus without complications: Secondary | ICD-10-CM | POA: Diagnosis present

## 2022-09-28 DIAGNOSIS — G4733 Obstructive sleep apnea (adult) (pediatric): Secondary | ICD-10-CM | POA: Diagnosis not present

## 2022-09-28 DIAGNOSIS — Z8249 Family history of ischemic heart disease and other diseases of the circulatory system: Secondary | ICD-10-CM | POA: Diagnosis not present

## 2022-09-28 DIAGNOSIS — E785 Hyperlipidemia, unspecified: Secondary | ICD-10-CM | POA: Diagnosis not present

## 2022-09-28 DIAGNOSIS — Z888 Allergy status to other drugs, medicaments and biological substances status: Secondary | ICD-10-CM

## 2022-09-28 DIAGNOSIS — R627 Adult failure to thrive: Secondary | ICD-10-CM | POA: Diagnosis not present

## 2022-09-28 DIAGNOSIS — J45909 Unspecified asthma, uncomplicated: Secondary | ICD-10-CM | POA: Diagnosis present

## 2022-09-28 DIAGNOSIS — Z79899 Other long term (current) drug therapy: Secondary | ICD-10-CM

## 2022-09-28 DIAGNOSIS — K56609 Unspecified intestinal obstruction, unspecified as to partial versus complete obstruction: Secondary | ICD-10-CM

## 2022-09-28 LAB — CBG MONITORING, ED: Glucose-Capillary: 154 mg/dL — ABNORMAL HIGH (ref 70–99)

## 2022-09-28 LAB — APTT: aPTT: 26 seconds (ref 24–36)

## 2022-09-28 LAB — I-STAT CHEM 8, ED
BUN: 16 mg/dL (ref 8–23)
Calcium, Ion: 1.12 mmol/L — ABNORMAL LOW (ref 1.15–1.40)
Chloride: 98 mmol/L (ref 98–111)
Creatinine, Ser: 0.4 mg/dL — ABNORMAL LOW (ref 0.44–1.00)
Glucose, Bld: 166 mg/dL — ABNORMAL HIGH (ref 70–99)
HCT: 40 % (ref 36.0–46.0)
Hemoglobin: 13.6 g/dL (ref 12.0–15.0)
Potassium: 2.8 mmol/L — ABNORMAL LOW (ref 3.5–5.1)
Sodium: 141 mmol/L (ref 135–145)
TCO2: 32 mmol/L (ref 22–32)

## 2022-09-28 LAB — PROTIME-INR
INR: 1 (ref 0.8–1.2)
Prothrombin Time: 12.9 seconds (ref 11.4–15.2)

## 2022-09-28 MED ORDER — IOHEXOL 350 MG/ML SOLN
75.0000 mL | Freq: Once | INTRAVENOUS | Status: AC | PRN
  Administered 2022-09-28: 75 mL via INTRAVENOUS

## 2022-09-29 ENCOUNTER — Encounter (HOSPITAL_COMMUNITY): Payer: Self-pay

## 2022-09-29 ENCOUNTER — Emergency Department (HOSPITAL_COMMUNITY): Payer: Medicare Other

## 2022-09-29 ENCOUNTER — Other Ambulatory Visit: Payer: Self-pay

## 2022-09-29 ENCOUNTER — Observation Stay (HOSPITAL_BASED_OUTPATIENT_CLINIC_OR_DEPARTMENT_OTHER): Payer: Medicare Other

## 2022-09-29 ENCOUNTER — Other Ambulatory Visit (HOSPITAL_COMMUNITY): Payer: Medicare Other

## 2022-09-29 DIAGNOSIS — Z6823 Body mass index (BMI) 23.0-23.9, adult: Secondary | ICD-10-CM | POA: Diagnosis not present

## 2022-09-29 DIAGNOSIS — Z8673 Personal history of transient ischemic attack (TIA), and cerebral infarction without residual deficits: Secondary | ICD-10-CM

## 2022-09-29 DIAGNOSIS — R4701 Aphasia: Secondary | ICD-10-CM | POA: Diagnosis not present

## 2022-09-29 DIAGNOSIS — Z7984 Long term (current) use of oral hypoglycemic drugs: Secondary | ICD-10-CM | POA: Diagnosis not present

## 2022-09-29 DIAGNOSIS — Z8249 Family history of ischemic heart disease and other diseases of the circulatory system: Secondary | ICD-10-CM | POA: Diagnosis not present

## 2022-09-29 DIAGNOSIS — I4891 Unspecified atrial fibrillation: Secondary | ICD-10-CM

## 2022-09-29 DIAGNOSIS — K567 Ileus, unspecified: Secondary | ICD-10-CM | POA: Diagnosis not present

## 2022-09-29 DIAGNOSIS — Z7401 Bed confinement status: Secondary | ICD-10-CM | POA: Diagnosis not present

## 2022-09-29 DIAGNOSIS — E785 Hyperlipidemia, unspecified: Secondary | ICD-10-CM | POA: Diagnosis present

## 2022-09-29 DIAGNOSIS — F0390 Unspecified dementia without behavioral disturbance: Secondary | ICD-10-CM | POA: Diagnosis present

## 2022-09-29 DIAGNOSIS — I10 Essential (primary) hypertension: Secondary | ICD-10-CM | POA: Diagnosis present

## 2022-09-29 DIAGNOSIS — J45909 Unspecified asthma, uncomplicated: Secondary | ICD-10-CM | POA: Diagnosis not present

## 2022-09-29 DIAGNOSIS — E876 Hypokalemia: Secondary | ICD-10-CM | POA: Diagnosis not present

## 2022-09-29 DIAGNOSIS — J4540 Moderate persistent asthma, uncomplicated: Secondary | ICD-10-CM | POA: Diagnosis not present

## 2022-09-29 DIAGNOSIS — I69354 Hemiplegia and hemiparesis following cerebral infarction affecting left non-dominant side: Secondary | ICD-10-CM | POA: Diagnosis not present

## 2022-09-29 DIAGNOSIS — R627 Adult failure to thrive: Secondary | ICD-10-CM | POA: Diagnosis present

## 2022-09-29 DIAGNOSIS — G4733 Obstructive sleep apnea (adult) (pediatric): Secondary | ICD-10-CM | POA: Diagnosis present

## 2022-09-29 DIAGNOSIS — J4489 Other specified chronic obstructive pulmonary disease: Secondary | ICD-10-CM | POA: Diagnosis present

## 2022-09-29 DIAGNOSIS — Z66 Do not resuscitate: Secondary | ICD-10-CM | POA: Diagnosis not present

## 2022-09-29 DIAGNOSIS — R1319 Other dysphagia: Secondary | ICD-10-CM | POA: Diagnosis not present

## 2022-09-29 DIAGNOSIS — E440 Moderate protein-calorie malnutrition: Secondary | ICD-10-CM | POA: Diagnosis present

## 2022-09-29 DIAGNOSIS — D509 Iron deficiency anemia, unspecified: Secondary | ICD-10-CM | POA: Diagnosis not present

## 2022-09-29 DIAGNOSIS — I480 Paroxysmal atrial fibrillation: Secondary | ICD-10-CM | POA: Diagnosis present

## 2022-09-29 DIAGNOSIS — E119 Type 2 diabetes mellitus without complications: Secondary | ICD-10-CM | POA: Diagnosis present

## 2022-09-29 DIAGNOSIS — F209 Schizophrenia, unspecified: Secondary | ICD-10-CM | POA: Diagnosis present

## 2022-09-29 DIAGNOSIS — N179 Acute kidney failure, unspecified: Secondary | ICD-10-CM | POA: Diagnosis present

## 2022-09-29 DIAGNOSIS — E1165 Type 2 diabetes mellitus with hyperglycemia: Secondary | ICD-10-CM

## 2022-09-29 DIAGNOSIS — R1311 Dysphagia, oral phase: Secondary | ICD-10-CM | POA: Diagnosis present

## 2022-09-29 DIAGNOSIS — R1310 Dysphagia, unspecified: Secondary | ICD-10-CM | POA: Diagnosis not present

## 2022-09-29 DIAGNOSIS — F17210 Nicotine dependence, cigarettes, uncomplicated: Secondary | ICD-10-CM | POA: Diagnosis present

## 2022-09-29 DIAGNOSIS — Z7189 Other specified counseling: Secondary | ICD-10-CM | POA: Diagnosis not present

## 2022-09-29 HISTORY — DX: Unspecified atrial fibrillation: I48.91

## 2022-09-29 LAB — CBC
HCT: 38.4 % (ref 36.0–46.0)
HCT: 38.9 % (ref 36.0–46.0)
Hemoglobin: 12.5 g/dL (ref 12.0–15.0)
Hemoglobin: 13 g/dL (ref 12.0–15.0)
MCH: 30.1 pg (ref 26.0–34.0)
MCH: 31.5 pg (ref 26.0–34.0)
MCHC: 32.6 g/dL (ref 30.0–36.0)
MCHC: 33.4 g/dL (ref 30.0–36.0)
MCV: 90 fL (ref 80.0–100.0)
MCV: 96.7 fL (ref 80.0–100.0)
Platelets: 259 10*3/uL (ref 150–400)
Platelets: 357 10*3/uL (ref 150–400)
RBC: 3.97 MIL/uL (ref 3.87–5.11)
RBC: 4.32 MIL/uL (ref 3.87–5.11)
RDW: 13.7 % (ref 11.5–15.5)
RDW: 13.9 % (ref 11.5–15.5)
WBC: 13.8 10*3/uL — ABNORMAL HIGH (ref 4.0–10.5)
WBC: 18.3 10*3/uL — ABNORMAL HIGH (ref 4.0–10.5)
nRBC: 0 % (ref 0.0–0.2)
nRBC: 0 % (ref 0.0–0.2)

## 2022-09-29 LAB — COMPREHENSIVE METABOLIC PANEL
ALT: 23 U/L (ref 0–44)
AST: 28 U/L (ref 15–41)
Albumin: 3.8 g/dL (ref 3.5–5.0)
Alkaline Phosphatase: 71 U/L (ref 38–126)
Anion gap: 15 (ref 5–15)
BUN: 15 mg/dL (ref 8–23)
CO2: 28 mmol/L (ref 22–32)
Calcium: 9.9 mg/dL (ref 8.9–10.3)
Chloride: 98 mmol/L (ref 98–111)
Creatinine, Ser: 0.56 mg/dL (ref 0.44–1.00)
GFR, Estimated: >60 mL/min (ref >60)
Glucose, Bld: 165 mg/dL — ABNORMAL HIGH (ref 70–99)
Potassium: 2.8 mmol/L — ABNORMAL LOW (ref 3.5–5.1)
Sodium: 141 mmol/L (ref 135–145)
Total Bilirubin: 0.6 mg/dL (ref 0.3–1.2)
Total Protein: 7.7 g/dL (ref 6.5–8.1)

## 2022-09-29 LAB — BASIC METABOLIC PANEL
Anion gap: 13 (ref 5–15)
BUN: 12 mg/dL (ref 8–23)
CO2: 27 mmol/L (ref 22–32)
Calcium: 9.7 mg/dL (ref 8.9–10.3)
Chloride: 96 mmol/L — ABNORMAL LOW (ref 98–111)
Creatinine, Ser: 0.62 mg/dL (ref 0.44–1.00)
GFR, Estimated: >60 mL/min (ref >60)
Glucose, Bld: 155 mg/dL — ABNORMAL HIGH (ref 70–99)
Potassium: 4.2 mmol/L (ref 3.5–5.1)
Sodium: 136 mmol/L (ref 135–145)

## 2022-09-29 LAB — DIFFERENTIAL
Abs Immature Granulocytes: 0.05 10*3/uL (ref 0.00–0.07)
Basophils Absolute: 0 10*3/uL (ref 0.0–0.1)
Basophils Relative: 0 %
Eosinophils Absolute: 0 10*3/uL (ref 0.0–0.5)
Eosinophils Relative: 0 %
Immature Granulocytes: 0 %
Lymphocytes Relative: 11 %
Lymphs Abs: 2 10*3/uL (ref 0.7–4.0)
Monocytes Absolute: 0.9 10*3/uL (ref 0.1–1.0)
Monocytes Relative: 5 %
Neutro Abs: 15.3 10*3/uL — ABNORMAL HIGH (ref 1.7–7.7)
Neutrophils Relative %: 84 %

## 2022-09-29 LAB — MAGNESIUM: Magnesium: 1.6 mg/dL — ABNORMAL LOW (ref 1.7–2.4)

## 2022-09-29 LAB — ECHOCARDIOGRAM COMPLETE
AR max vel: 1.95 cm2
AV Peak grad: 7.8 mmHg
Ao pk vel: 1.4 m/s
Area-P 1/2: 3.65 cm2
Height: 57 in
S' Lateral: 2.25 cm
Weight: 1767.21 oz

## 2022-09-29 LAB — MRSA NEXT GEN BY PCR, NASAL: MRSA by PCR Next Gen: NOT DETECTED

## 2022-09-29 LAB — TSH: TSH: 1.044 u[IU]/mL (ref 0.350–4.500)

## 2022-09-29 LAB — GLUCOSE, CAPILLARY
Glucose-Capillary: 166 mg/dL — ABNORMAL HIGH (ref 70–99)
Glucose-Capillary: 139 mg/dL — ABNORMAL HIGH (ref 70–99)
Glucose-Capillary: 126 mg/dL — ABNORMAL HIGH (ref 70–99)
Glucose-Capillary: 128 mg/dL — ABNORMAL HIGH (ref 70–99)
Glucose-Capillary: 134 mg/dL — ABNORMAL HIGH (ref 70–99)

## 2022-09-29 LAB — HIV ANTIBODY (ROUTINE TESTING W REFLEX): HIV Screen 4th Generation wRfx: NONREACTIVE

## 2022-09-29 LAB — ETHANOL: Alcohol, Ethyl (B): <10 mg/dL (ref <10)

## 2022-09-29 MED ORDER — PANTOPRAZOLE SODIUM 40 MG IV SOLR
40.0000 mg | INTRAVENOUS | Status: DC
  Administered 2022-09-29 – 2022-10-10 (×12): 40 mg via INTRAVENOUS
  Filled 2022-09-29 (×13): qty 10

## 2022-09-29 MED ORDER — ACETAMINOPHEN 325 MG PO TABS
650.0000 mg | ORAL_TABLET | Freq: Four times a day (QID) | ORAL | Status: DC | PRN
  Administered 2022-10-06 – 2022-10-08 (×3): 650 mg via ORAL
  Filled 2022-09-29 (×5): qty 2

## 2022-09-29 MED ORDER — SODIUM CHLORIDE 0.9% FLUSH
3.0000 mL | Freq: Two times a day (BID) | INTRAVENOUS | Status: DC
  Administered 2022-09-29 – 2022-10-11 (×18): 3 mL via INTRAVENOUS

## 2022-09-29 MED ORDER — POTASSIUM CHLORIDE 10 MEQ/100ML IV SOLN
10.0000 meq | INTRAVENOUS | Status: AC
  Administered 2022-09-29 (×4): 10 meq via INTRAVENOUS
  Filled 2022-09-29 (×4): qty 100

## 2022-09-29 MED ORDER — PERFLUTREN LIPID MICROSPHERE
1.0000 mL | INTRAVENOUS | Status: AC | PRN
  Administered 2022-09-29: 4 mL via INTRAVENOUS

## 2022-09-29 MED ORDER — ORAL CARE MOUTH RINSE: 15.0000 mL | OROMUCOSAL | Status: DC | PRN

## 2022-09-29 MED ORDER — MAGNESIUM SULFATE 2 GM/50ML IV SOLN
2.0000 g | Freq: Once | INTRAVENOUS | Status: AC
  Administered 2022-09-29: 2 g via INTRAVENOUS
  Filled 2022-09-29: qty 50

## 2022-09-29 MED ORDER — ONDANSETRON HCL 4 MG PO TABS: 4.0000 mg | ORAL_TABLET | Freq: Four times a day (QID) | ORAL | Status: DC | PRN

## 2022-09-29 MED ORDER — INSULIN ASPART 100 UNIT/ML IJ SOLN
0.0000 [IU] | INTRAMUSCULAR | Status: DC
  Administered 2022-09-29 – 2022-10-11 (×5): 1 [IU] via SUBCUTANEOUS

## 2022-09-29 MED ORDER — DILTIAZEM HCL-DEXTROSE 125-5 MG/125ML-% IV SOLN (PREMIX)
5.0000 mg/h | INTRAVENOUS | Status: DC
  Administered 2022-09-29: 15 mg/h via INTRAVENOUS
  Administered 2022-09-29: 5 mg/h via INTRAVENOUS
  Administered 2022-09-29 – 2022-10-01 (×5): 15 mg/h via INTRAVENOUS
  Filled 2022-09-29 (×5): qty 125

## 2022-09-29 MED ORDER — DILTIAZEM LOAD VIA INFUSION
20.0000 mg | Freq: Once | INTRAVENOUS | Status: AC
  Administered 2022-09-29: 20 mg via INTRAVENOUS
  Filled 2022-09-29: qty 20

## 2022-09-29 MED ORDER — ACETAMINOPHEN 650 MG RE SUPP
650.0000 mg | Freq: Four times a day (QID) | RECTAL | Status: DC | PRN
  Administered 2022-10-03: 650 mg via RECTAL
  Filled 2022-09-29: qty 1

## 2022-09-29 MED ORDER — ENOXAPARIN SODIUM 40 MG/0.4ML IJ SOSY
40.0000 mg | PREFILLED_SYRINGE | Freq: Every day | INTRAMUSCULAR | Status: DC
  Administered 2022-09-29 – 2022-10-08 (×10): 40 mg via SUBCUTANEOUS
  Filled 2022-09-29 (×11): qty 0.4

## 2022-09-29 MED ORDER — THIAMINE HCL 100 MG/ML IJ SOLN
100.0000 mg | Freq: Every day | INTRAMUSCULAR | Status: AC
  Administered 2022-09-29 – 2022-10-03 (×5): 100 mg via INTRAVENOUS
  Filled 2022-09-29 (×5): qty 2

## 2022-09-29 MED ORDER — ONDANSETRON HCL 4 MG/2ML IJ SOLN
4.0000 mg | Freq: Four times a day (QID) | INTRAMUSCULAR | Status: DC | PRN
  Administered 2022-09-29: 4 mg via INTRAVENOUS
  Filled 2022-09-29: qty 2

## 2022-09-29 NOTE — Code Documentation (Addendum)
Responded to Code Stroke called at [id number] for slurred speech and aphasia, [id number]. Pt arrived at 2331, CBG-154, NIH-12, CT head-Possible small focus of hyperdensity at the left aspect of the pons, which could represent hemorrhage, calcification, or laminar necrosis. CTA-no LVO. MRI-neg for stroke. TNK not given-non-focal exam. Plan metabolic w/o.

## 2022-09-29 NOTE — Progress Notes (Signed)
Subjective: Patient admitted this morning, see detailed H&P by Dr Antionette Char 67 year old female with medical history of asthma, diabetes mellitus type 2, hypertension, hyperlipidemia, ICH with residual dysarthria presented for worsening weakness and worsening speech difficulty.  In the ED she was afebrile, found to have A-fib with RVR with heart rate 151.  MRI brain showed limited study but negative for acute intracranial abnormality.  Neurology was consulted, felt likely recrudescence of old stroke symptoms in setting of underlying systemic infection.  Possible small stroke in setting of A-fib.  No anticoagulation recommended due to extremely high risk of hemorrhage in the brain because of presence of chronic microhemorrhages and prior ICH as per neurology.  Also had dysphagia.  Swallow evaluation obtained.  Patient failed swallow evaluation, currently NPO.  Vitals:   09/29/22 1200 09/29/22 1300  BP: 134/72 133/88  Pulse: 95 92  Resp: 16 20  Temp:    SpO2: 99% 100%      A/P  New onset atrial fibrillation -Started on IV Cardizem gtt. -Heart rate is controlled -No anticoagulation started due to increased risk of intracranial hemorrhage in the brain -Follow echocardiogram -TSH 1.044  Dysphagia -Patient failed swallow evaluation, formal swallow evaluation obtained -Patient made n.p.o. due to high risk of aspiration -Core track feeding tube ordered -Will need discussion for PEG tube placement  History of stroke -Presented with left side weakness; worsening aphasia -Neurology saw patient, felt a recrudescence of old stroke symptoms versus new stroke -MRI brain was unremarkable for acute abnormality  Hypomagnesemia -Replace magnesium and follow mag level in a.m.  Vomiting -Patient had multiple episodes of small amount of emesis -Denies abdominal pain -Start IV Protonix  Diabetes mellitus type 2 -Continue sliding scale insulin with NovoLog  OSA -Continue nightly  CPAP  Leukocytosis -WBC improved from 18.3-13.8 -Not on antibiotics -Continue to monitor   - [name] [facility]

## 2022-09-29 NOTE — H&P (Signed)
History and Physical    Brenda Riggs:[id number] DOB: 04/04/1956 DOA: 09/28/2022  PCP: [name], MD   Patient coming from: SNF   Chief Complaint: Increased left-sided weakness and speech difficulty   HPI: Brenda Riggs is a 67 y.o. female with medical history significant for asthma, type 2 diabetes mellitus, hypertension, hyperlipidemia, and ICH with residual dysarthria who presents for evaluation of worsened weakness and worsened speech difficulty.  Per report of the patient's facility, she was last known to be in her usual state at 10:30 PM last night and shortly after this was noted to have increase in her chronic dysarthria, and possibly in her chronic weakness as well.  Patient's daughter provided additional history by phone, noting that the patient has had worsening in her general debility and speech over the past few months.  She requires assistance with ADLs, is essentially bedbound, and has chronic dysarthria, dysphagia, and weakness.  ED Course: Upon arrival to the ED, patient is found to be afebrile and saturating well on room air with elevated heart rate and stable blood pressure.  EKG demonstrates atrial fibrillation with rate 151 and repolarization abnormality.  No acute findings are noted on chest x-ray.  MRI brain is a limited study but negative for acute intracranial abnormality.  Labs are most notable for potassium 2.8 and WBC 18,300.  Patient was evaluated by neurology in the emergency department and she was treated with 40 mEq IV potassium, 20 mg IV diltiazem, and was started on diltiazem infusion.  Review of Systems:  ROS limited by patient's clinical condition.  Past Medical History:  Diagnosis Date   Allergy    Asthma    Blood transfusion without reported diagnosis    Chronic kidney disease    COPD (chronic obstructive pulmonary disease) (HCC)    Depression    Diabetes mellitus without complication (HCC)    Hyperlipidemia    Hypertension    Stroke  [facility])     Past Surgical History:  Procedure Laterality Date   ABDOMINAL HYSTERECTOMY     APPENDECTOMY     CESAREAN SECTION     TUBAL LIGATION      Social History:   reports that she has been smoking cigarettes. She has a 4.10 pack-year smoking history. She has never used smokeless tobacco. She reports that she does not drink alcohol and does not use drugs.  Allergies  Allergen Reactions   Penicillins Other (See Comments)    Per caregiver. Unknown reaction Did it involve swelling of the face/tongue/throat, SOB, or low BP? Unknown Did it involve sudden or severe rash/hives, skin peeling, or any reaction on the inside of your mouth or nose? Unknown Did you need to seek medical attention at a hospital or doctor's office? Unknown When did it last happen?    unknown   If all above answers are "NO", may proceed with cephalosporin use.   Thioridazine Hcl Other (See Comments)    Present in Epic; not noted on MAR    Family History  Problem Relation Age of Onset   Heart disease Mother      Prior to Admission medications   Medication Sig Start Date End Date Taking? Authorizing Provider  acetaminophen (TYLENOL) 325 MG tablet Take 650 mg by mouth every 6 (six) hours as needed for headache (minor discomfort/fever up to 100 degrees orally).     [provider]  albuterol (PROVENTIL HFA;VENTOLIN HFA) 108 (90 BASE) MCG/ACT inhaler Inhale 2 puffs into the lungs 4 (four) times  daily as needed for wheezing or shortness of breath.     [provider]  alum & mag hydroxide-simeth (MAALOX/MYLANTA) 200-200-20 MG/5ML suspension Take 30 mLs by mouth once as needed for indigestion or heartburn.    [provider]  amLODipine (NORVASC) 5 MG tablet Take 1 tablet (5 mg total) by mouth daily. 03/26/20   Layne Benton, NP  atorvastatin (LIPITOR) 40 MG tablet Take 1 tablet (40 mg total) by mouth daily at 6 PM. 03/25/20   Layne Benton, NP  beclomethasone (QVAR REDIHALER) 40 MCG/ACT  inhaler Inhale 2 puffs into the lungs 2 (two) times daily.    [provider]  cholecalciferol (VITAMIN D3) 25 MCG (1000 UNIT) tablet Take 1,000 Units by mouth daily.    [provider]  divalproex (DEPAKOTE ER) 250 MG 24 hr tablet Take 250 mg by mouth at bedtime.    [provider]  fluticasone (FLONASE) 50 MCG/ACT nasal spray Place 1 spray into both nostrils daily.    [provider]  guaifenesin (ROBITUSSIN) 100 MG/5ML syrup Take 200 mg by mouth every 6 (six) hours as needed for cough.    [provider]  loperamide (IMODIUM A-D) 2 MG tablet Take 2 mg by mouth 4 (four) times daily as needed for diarrhea or loose stools.     [provider]  magnesium hydroxide (MILK OF MAGNESIA) 400 MG/5ML suspension Take 22.5 mLs by mouth every 8 (eight) hours as needed (constipation (if no relief in 8 hours give with 6 oz prune juice)).     [provider]  metFORMIN (GLUCOPHAGE) 500 MG tablet Take 500 mg by mouth every 12 (twelve) hours.     [provider]  metoprolol tartrate (LOPRESSOR) 50 MG tablet Take 1 tablet (50 mg total) by mouth 2 (two) times daily. 03/25/20   Layne Benton, NP  Nutritional Supplements (NUTRITIONAL SUPPLEMENT PO) Take 1 each by mouth 3 (three) times daily with meals.    [provider]  Pollen Extracts (PROSTAT PO) Take 30 Units by mouth in the morning and at bedtime.    [provider]  Sodium Phosphates (FLEET ENEMA RE) Place 1 enema rectally daily as needed (constipation not relieved by MOM and prune juice).    [provider]  vitamin C (ASCORBIC ACID) 500 MG tablet Take 500 mg by mouth daily.    [provider]    Physical Exam: Vitals:   09/28/22 2340 09/28/22 2345 09/29/22 0044 09/29/22 0130  BP: (!) 140/115 (!) 135/90  139/65  Pulse: (!) 137 (!) 155  (!) 128  Resp:    (!) 23  Temp: 97.6 F (36.4 C)     TempSrc: Rectal     SpO2: 100% 99%    Weight:   52 kg    Height:   4\' 9"  (1.448 m)      Constitutional: NAD, no pallor or diaphoresis   Eyes: PERTLA, lids and conjunctivae normal ENMT: Mucous membranes are moist. Posterior pharynx clear of any exudate or lesions.   Neck: supple, no masses  Respiratory: clear to auscultation bilaterally, no wheezing, no crackles. No accessory muscle use.  Cardiovascular: S1 & S2 heard, regular rate and rhythm. No extremity edema. No significant JVD. Abdomen: No distension, no tenderness, soft. Bowel sounds active.  Musculoskeletal: no clubbing / cyanosis. No joint deformity upper and lower extremities.   Skin: no significant rashes, lesions, ulcers. Warm, dry, well-perfused. Neurologic: Dysarthria, expressive aphasia, left facial droop. Strength 4/5 throughout.  Alert but unable to answer orientation questions.  Psychiatric: Calm. Cooperative.    Labs and Imaging on Admission: I have personally reviewed following labs and imaging studies  CBC: Recent Labs  Lab 09/28/22 2337 09/28/22 2342  WBC 18.3*  --   NEUTROABS 15.3*  --   HGB 12.5 13.6  HCT 38.4 40.0  MCV 96.7  --   PLT 259  --    Basic Metabolic Panel: Recent Labs  Lab 09/28/22 2337 09/28/22 2342  NA 141 141  K 2.8* 2.8*  CL 98 98  CO2 28  --   GLUCOSE 165* 166*  BUN 15 16  CREATININE 0.56 0.40*  CALCIUM 9.9  --    GFR: Estimated Creatinine Clearance: 48.7 mL/min (A) (by C-G formula based on SCr of 0.4 mg/dL (L)). Liver Function Tests: Recent Labs  Lab 09/28/22 2337  AST 28  ALT 23  ALKPHOS 71  BILITOT 0.6  PROT 7.7  ALBUMIN 3.8   No results for input(s): "LIPASE", "AMYLASE" in the last 168 hours. No results for input(s): "AMMONIA" in the last 168 hours. Coagulation Profile: Recent Labs  Lab 09/28/22 2337  INR 1.0   Cardiac Enzymes: No results for input(s): "CKTOTAL", "CKMB", "CKMBINDEX", "TROPONINI" in the last 168 hours. BNP (last 3 results) No results for input(s): "PROBNP" in the last 8760 hours. HbA1C: No  results for input(s): "HGBA1C" in the last 72 hours. CBG: Recent Labs  Lab 09/28/22 2335  GLUCAP 154*   Lipid Profile: No results for input(s): "CHOL", "HDL", "LDLCALC", "TRIG", "CHOLHDL", "LDLDIRECT" in the last 72 hours. Thyroid Function Tests: No results for input(s): "TSH", "T4TOTAL", "FREET4", "T3FREE", "THYROIDAB" in the last 72 hours. Anemia Panel: No results for input(s): "VITAMINB12", "FOLATE", "FERRITIN", "TIBC", "IRON", "RETICCTPCT" in the last 72 hours. Urine analysis:    Component Value Date/Time   COLORURINE YELLOW 08/15/2020 1546   APPEARANCEUR CLOUDY (A) 08/15/2020 1546   LABSPEC 1.014 08/15/2020 1546   PHURINE 6.0 08/15/2020 1546   GLUCOSEU NEGATIVE 08/15/2020 1546   HGBUR NEGATIVE 08/15/2020 1546   HGBUR trace-intact 09/10/2009 1115   BILIRUBINUR NEGATIVE 08/15/2020 1546   KETONESUR NEGATIVE 08/15/2020 1546   PROTEINUR NEGATIVE 08/15/2020 1546   UROBILINOGEN 0.2 10/22/2009 2021   NITRITE NEGATIVE 08/15/2020 1546   LEUKOCYTESUR NEGATIVE 08/15/2020 1546   Sepsis Labs: @LABRCNTIP (procalcitonin:4,lacticidven:4) )No results found for this or any previous visit (from the past 240 hour(s)).   Radiological Exams on Admission: MR BRAIN WO CONTRAST  Result Date: 09/29/2022 CLINICAL DATA:  Stroke suspected, slurred speech, left-sided weakness EXAM: MRI HEAD WITHOUT CONTRAST TECHNIQUE: Multiplanar, multiecho pulse sequences of the brain and surrounding structures were obtained without intravenous contrast. COMPARISON:  08/15/2020 MRI head, correlation is also made with 09/28/2022 CT head FINDINGS: Evaluation is somewhat limited by the absence of a dedicated susceptibility weighted sequence, which could not be obtained due to patient noncooperation. Brain: No restricted diffusion to suggest acute or subacute infarct. No definite hemorrhage is seen to correlate with the suspected hyperdense focus on the 09/28/2022 CT, which likely reflects calcifications related to prior  hemorrhage. No mass, mass effect, or midline shift. No hydrocephalus or extra-axial collection. Partial empty sella. Normal craniocervical junction. Numerous remote lacunar infarcts in the pons bilateral thalami, right greater than left basal ganglia, and bilateral corona radiata. Although a SWI or GRE sequence was not obtained, foci of susceptibility compatible with hemosiderin deposition are noted on the T2 weighted sequence in the pons, midbrain, and medulla. Confluent T2 hyperintense signal in the periventricular white  matter, likely the sequela of moderate chronic small vessel ischemic disease. Advanced cerebral volume loss for age. Vascular: Normal arterial flow voids. Skull and upper cervical spine: Normal marrow signal. Sinuses/Orbits: Clear paranasal sinuses. No acute finding in the orbits. Other: The mastoids are well aerated. IMPRESSION: Evaluation is somewhat limited by the absence of a dedicated susceptibility weighted sequence, which could not be obtained due to patient noncooperation. Within this limitation, no acute intracranial process. No evidence acute infarct or hemorrhage. Electronically Signed   By: Wiliam Ke M.D.   On: 09/29/2022 01:31   DG Chest Port 1 View  Result Date: 09/29/2022 CLINICAL DATA:  Encounter for screening. EXAM: PORTABLE CHEST 1 VIEW COMPARISON:  August 15, 2020 FINDINGS: The heart size and mediastinal contours are within normal limits. There is marked severity calcification of the aortic arch. Low lung volumes are noted. There is no evidence of an acute infiltrate, pleural effusion or pneumothorax. Radiopaque contrast is seen within the visualized portions of the renal collecting systems. Multilevel degenerative changes seen throughout the thoracic spine. IMPRESSION: No active disease. Electronically Signed   By: Aram Candela M.D.   On: 09/29/2022 01:30   CT ANGIO HEAD NECK W WO CM (CODE STROKE)  Result Date: 09/29/2022 CLINICAL DATA:  Slurred speech,  worsening left-sided weakness EXAM: CT ANGIOGRAPHY HEAD AND NECK WITH AND WITHOUT CONTRAST TECHNIQUE: Multidetector CT imaging of the head and neck was performed using the standard protocol during bolus administration of intravenous contrast. Multiplanar CT image reconstructions and MIPs were obtained to evaluate the vascular anatomy. Carotid stenosis measurements (when applicable) are obtained utilizing NASCET criteria, using the distal internal carotid diameter as the denominator. RADIATION DOSE REDUCTION: This exam was performed according to the departmental dose-optimization program which includes automated exposure control, adjustment of the mA and/or kV according to patient size and/or use of iterative reconstruction technique. CONTRAST:  75 mL Omnipaque 350 COMPARISON:  No prior CTA available, correlation is made with MRA 03/21/2020 FINDINGS: CT HEAD FINDINGS For noncontrast findings, please see same day CT head. CTA NECK FINDINGS Aortic arch: Standard branching. Imaged portion shows no evidence of aneurysm or dissection. No significant stenosis of the major arch vessel origins. Aortic atherosclerosis. Right carotid system: No evidence of dissection, occlusion, or hemodynamically significant stenosis (greater than 50%). Left carotid system: No evidence of dissection, occlusion, or hemodynamically significant stenosis (greater than 50%). Vertebral arteries: No evidence of dissection, occlusion, or hemodynamically significant stenosis (greater than 50%). Skeleton: No acute osseous abnormality. Degenerative changes in the cervical spine. Other neck: Negative. Upper chest: No focal pulmonary opacity or pleural effusion. Emphysema. Review of the MIP images confirms the above findings CTA HEAD FINDINGS Anterior circulation: Both internal carotid arteries are patent to the termini, without significant stenosis. A1 segments patent. Normal anterior communicating artery. Anterior cerebral arteries are patent to their  distal aspects without significant stenosis. No M1 stenosis or occlusion. MCA branches perfused to their distal aspects without significant stenosis. Posterior circulation: Vertebral arteries patent to the vertebrobasilar junction without significant stenosis. The right PICA is patent proximally. Common origin of the left AICA and PICA. Basilar patent to its distal aspect without significant stenosis. Superior cerebellar arteries patent proximally. Patent P1 segments. PCAs perfused to their distal aspects with mild multifocal narrowing throughout. The bilateral posterior communicating arteries are not visualized. Venous sinuses: Not well opacified due to arterial timing. Anatomic variants: None significant. Review of the MIP images confirms the above findings IMPRESSION: 1. No intracranial large vessel occlusion or significant  stenosis. 2. No hemodynamically significant stenosis in the neck. 3. Aortic atherosclerosis. Aortic Atherosclerosis (ICD10-I70.0). Electronically Signed   By: Wiliam Ke M.D.   On: 09/29/2022 00:03   CT HEAD CODE STROKE WO CONTRAST  Result Date: 09/28/2022 CLINICAL DATA:  Code stroke. Slurred speech, worsening left-sided weakness, prior strokes EXAM: CT HEAD WITHOUT CONTRAST TECHNIQUE: Contiguous axial images were obtained from the base of the skull through the vertex without intravenous contrast. RADIATION DOSE REDUCTION: This exam was performed according to the departmental dose-optimization program which includes automated exposure control, adjustment of the mA and/or kV according to patient size and/or use of iterative reconstruction technique. COMPARISON:  08/16/2020 FINDINGS: Brain: Possible small focus of hyperdensity at the left aspect of the pons (series 2, image 11 and series 6, image 28), which could represent hemorrhage, calcification, or laminar necrosis. No evidence of acute infarction, mass, mass effect, or midline shift. No hydrocephalus or extra-axial collection.  Vascular: No hyperdense vessel. Skull: Negative for fracture or focal lesion. Sinuses/Orbits: No acute finding. Other: The mastoid air cells are well aerated. ASPECTS ([place] Stroke Program Early CT Score) - Ganglionic level infarction (caudate, lentiform nuclei, internal capsule, insula, M1-M3 cortex): 7 - Supraganglionic infarction (M4-M6 cortex): 3 Total score (0-10 with 10 being normal): 10 IMPRESSION: Possible small focus of hyperdensity at the left aspect of the pons, which could represent hemorrhage, calcification, or laminar necrosis. Attention on subsequent CTA and MRI. These findings were discussed on 09/28/2022 at 11:54 pm with provider ARORA. Electronically Signed   By: Wiliam Ke M.D.   On: 09/28/2022 23:54    EKG: Independently reviewed. Rapid atrial fibrillation, rate 151.   Assessment/Plan   1. New-onset atrial fibrillation with RVR  - Started on IV diltiazem in ED   - Correct electrolytes, continue cardiac monitoring, continue diltiazem infusion, check TSH and echocardiogram, discuss risk/benefit of anticoagulation with family when they arrive in AM (pt has hx of ICH)     2. Increased aphasia and left-sided weakness  - No acute findings on limited MRI brain  - Appreciate neurology assessment, recrudescence vs small acute CVA   3. Type II DM  - A1c was 6.4% in April 2022  - Hold metformin, check CBGs, and use low-intensity SSI for now    4. Hypokalemia  - Replacing, repeat chem panel in am   5. Asthma  - Not in exacerbation on admission   - Continue ICS and as-needed SABA    6. OSA  - CPAP qHS   7. Leukocytosis  - WBC is 18,300 on admission without fever or apparent infection  - Monitor off of antibiotics for now, culture if febrile    DVT prophylaxis: Lovenox  Code Status: Full  Level of Care: Level of care: Progressive Family Communication: None present  Disposition Plan:  Patient is from: SNF  Anticipated d/c is to: SNF  Anticipated d/c date is: Possibly  as early as 09/30/22  Patient currently: Pending rate-control  Consults called: Neurology  Admission status: Observation     Briscoe Deutscher, MD [facility]  09/29/2022, 2:37 AM

## 2022-09-29 NOTE — ED Notes (Signed)
Pt in MRI.

## 2022-09-29 NOTE — ED Provider Notes (Signed)
[facility]ENC[facility] DEPT [facility] Emergency Department Provider Note MRN:  [id number]  Arrival date & time: 09/29/22     Chief Complaint   No chief complaint on file.   History of Present Illness   Brenda Riggs is a 67 y.o. year-old female presents to the ED with chief complaint of left-sided weakness and slurred speech.  She was brought in as a code stroke.  At baseline she is nonambulatory with severe dysarthria and dysphagia.  Was reportedly worse today, and this was the reason for transport to the hospital.  Patient mostly unable to provide any meaningful history.  Hx provided by EMS, patient, and nursing.   Review of Systems  Pertinent positive and negative review of systems noted in HPI.    Physical Exam   Vitals:   09/28/22 2345 09/29/22 0130  BP: (!) 135/90 139/65  Pulse: (!) 155 (!) 128  Resp:  (!) 23  Temp:    SpO2: 99%     CONSTITUTIONAL:  non toxic-appearing, NAD NEURO:  Alert and oriented x 3, CN 3-12 grossly intact EYES:  eyes equal and reactive ENT/NECK:  Supple, no stridor  CARDIO:  tachycardic, irregularly irregular rhythm, appears well-perfused  PULM:  No respiratory distress,  GI/GU:  non-distended,  MSK/SPINE:  No gross deformities, no edema, moves all extremities  SKIN:  no rash, atraumatic   *Additional and/or pertinent findings included in MDM below  Diagnostic and Interventional Summary    EKG Interpretation  Date/Time:    Ventricular Rate:    PR Interval:    QRS Duration:   QT Interval:    QTC Calculation:   R Axis:     Text Interpretation:         Labs Reviewed  CBC - Abnormal; Notable for the following components:      Result Value   WBC 18.3 (*)    All other components within normal limits  DIFFERENTIAL - Abnormal; Notable for the following components:   Neutro Abs 15.3 (*)    All other components within normal limits  COMPREHENSIVE METABOLIC PANEL - Abnormal; Notable for the following components:   Potassium 2.8 (*)     Glucose, Bld 165 (*)    All other components within normal limits  I-STAT CHEM 8, ED - Abnormal; Notable for the following components:   Potassium 2.8 (*)    Creatinine, Ser 0.40 (*)    Glucose, Bld 166 (*)    Calcium, Ion 1.12 (*)    All other components within normal limits  CBG MONITORING, ED - Abnormal; Notable for the following components:   Glucose-Capillary 154 (*)    All other components within normal limits  ETHANOL  PROTIME-INR  APTT  RAPID URINE DRUG SCREEN, HOSP PERFORMED  URINALYSIS, ROUTINE W REFLEX MICROSCOPIC    DG Chest Port 1 View  Final Result    MR BRAIN WO CONTRAST  Final Result    CT ANGIO HEAD NECK W WO CM (CODE STROKE)  Final Result    CT HEAD CODE STROKE WO CONTRAST  Final Result      Medications  diltiazem (CARDIZEM) 1 mg/mL load via infusion 20 mg (20 mg Intravenous Bolus from Bag 09/29/22 0115)    And  diltiazem (CARDIZEM) 125 mg in dextrose 5% 125 mL (1 mg/mL) infusion (10 mg/hr Intravenous Rate/Dose Change 09/29/22 0150)  potassium chloride 10 mEq in 100 mL IVPB (10 mEq Intravenous New Bag/Given 09/29/22 0216)  iohexol (OMNIPAQUE) 350 MG/ML injection 75 mL (75 mLs Intravenous Contrast  Given 09/28/22 2354)     Procedures  /  Critical Care .Critical Care  Performed by: Roxy Horseman, PA-C Authorized by: Roxy Horseman, PA-C   Critical care provider statement:    Critical care time (minutes):  51   Critical care was necessary to treat or prevent imminent or life-threatening deterioration of the following conditions:  CNS failure or compromise and circulatory failure   Critical care was time spent personally by me on the following activities:  Development of treatment plan with patient or surrogate, discussions with consultants, evaluation of patient's response to treatment, examination of patient, ordering and review of laboratory studies, ordering and review of radiographic studies, ordering and performing treatments and interventions,  pulse oximetry, re-evaluation of patient's condition and review of old charts   ED Course and Medical Decision Making  I have reviewed the triage vital signs, the nursing notes, and pertinent available records from the EMR.  Social Determinants Affecting Complexity of Care: Patient has problems living alone.   ED Course:    Medical Decision Making Patient BIB EMS as code stroke for left sided weakness and dysarthria.  Has baseline severe dysarthria and dysphagia.   Cleared by neuro.  New onset afib with RVR.  Uncertain of onset.  Not a good candidate for electrical cardioversion.  Cardizem infusing.    Will need admission.  Amount and/or Complexity of Data Reviewed Labs: ordered.    Details: K is 2.8, will replace Leukocytosis to 18.3, no infection on CXR, UA pending  Radiology: ordered and independent interpretation performed.    Details: No large ICH  Risk Prescription drug management. Decision regarding hospitalization.     Consultants: I consulted with Dr. Wilford Corner, who cleared from neuro.  Dr. Wilford Corner states that he'd hold anticoagulation because patient is not a good candidate.  Daughter will come in the morning. Will need hospitalist admission.   Treatment and Plan: Patient's exam and diagnostic results are concerning for afib with RVR.  Feel that patient will need admission to the hospital for further treatment and evaluation.  Patient seen by and discussed with attending physician, Dr. Nicanor Alcon, who agrees with plan for admission.  Final Clinical Impressions(s) / ED Diagnoses     ICD-10-CM   1. Atrial fibrillation with RVR [facility])  I48.91       ED Discharge Orders     None         Discharge Instructions Discussed with and Provided to Patient:   Discharge Instructions   None      Roxy Horseman, PA-C 09/29/22 0239    Palumbo, April, MD 09/29/22 0302

## 2022-09-29 NOTE — Consult Note (Signed)
Neurology Consultation  Reason for Consult: Code stroke for worsening slurred speech and worsening weakness Referring Physician: Dr. Daun Peacock  CC: Worsening slurred speech and worsening weakness  History is obtained from: Chart, EMS  HPI: Brenda Riggs is a 67 y.o. female past medical history of pontine ICH with residual dysarthria, tobacco abuse, dementia, hypertension hyperlipidemia COPD brought into the emergency room from her residential facility at [facility] for concerns of worsening dysarthria and possible worsening weakness with reported last known well from the facility at 10:30 PM.  Shortly after, rechecking on her, she appeared more slurred and weak.  EMS was called.  On EMS assessment, she had weakness on the left arm and leg as well as some weakness on the right but the weakness on the left was more pronounced. Due to the reported sudden onset of symptoms, she was brought in as a code stroke.  My detailed examination as below.  Review of her chart reveals baseline modified Rankin of 4 2022 with residual dysarthria and bilateral weakness from prior strokes and prior MRIs with extensive chronic microhemorrhages in the brain-likely sequela of uncontrolled hypertension and small vessel disease .  Also of note, en route to the hospital, patient was noted to be in atrial fibrillation with no known history of A-fib-likely new onset atrial fibrillation  Was able to speak to the daughter around 1:50 AM and she reported that patient's speech has been drastically deteriorating over the past few months.  She has also deteriorated and her physical abilities to take care of herself and is essentially bedbound at this time  LKW: 10:30 PM IV thrombolysis given?: no, nonfocal symptoms, unclear of what the new symptoms are versus worsening of old symptoms. EVT: No ELVO Premorbid modified Rankin scale (mRS): 5  ROS: Unable to perform due to her mentation  Past Medical History:  Diagnosis Date    Allergy    Asthma    Blood transfusion without reported diagnosis    Chronic kidney disease    COPD (chronic obstructive pulmonary disease) (HCC)    Depression    Diabetes mellitus without complication (HCC)    Hyperlipidemia    Hypertension    Stroke (HCC)      Family History  Problem Relation Age of Onset   Heart disease Mother      Social History:   reports that she has been smoking cigarettes. She has a 4.10 pack-year smoking history. She has never used smokeless tobacco. She reports that she does not drink alcohol and does not use drugs.  Medications  Current Facility-Administered Medications:    diltiazem (CARDIZEM) 1 mg/mL load via infusion 20 mg, 20 mg, Intravenous, Once **AND** diltiazem (CARDIZEM) 125 mg in dextrose 5% 125 mL (1 mg/mL) infusion, 5-15 mg/hr, Intravenous, Continuous, Browning, Robert, PA-C   potassium chloride 10 mEq in 100 mL IVPB, 10 mEq, Intravenous, Q1 Hr x 4, Dahlia Client, Robert, PA-C  Current Outpatient Medications:    acetaminophen (TYLENOL) 325 MG tablet, Take 650 mg by mouth every 6 (six) hours as needed for headache (minor discomfort/fever up to 100 degrees orally). , Disp: , Rfl:    albuterol (PROVENTIL HFA;VENTOLIN HFA) 108 (90 BASE) MCG/ACT inhaler, Inhale 2 puffs into the lungs 4 (four) times daily as needed for wheezing or shortness of breath. , Disp: , Rfl:    alum & mag hydroxide-simeth (MAALOX/MYLANTA) 200-200-20 MG/5ML suspension, Take 30 mLs by mouth once as needed for indigestion or heartburn., Disp: , Rfl:    amLODipine (NORVASC) 5  MG tablet, Take 1 tablet (5 mg total) by mouth daily., Disp: , Rfl:    atorvastatin (LIPITOR) 40 MG tablet, Take 1 tablet (40 mg total) by mouth daily at 6 PM., Disp: , Rfl:    beclomethasone (QVAR REDIHALER) 40 MCG/ACT inhaler, Inhale 2 puffs into the lungs 2 (two) times daily., Disp: , Rfl:    cholecalciferol (VITAMIN D3) 25 MCG (1000 UNIT) tablet, Take 1,000 Units by mouth daily., Disp: , Rfl:    divalproex  (DEPAKOTE ER) 250 MG 24 hr tablet, Take 250 mg by mouth at bedtime., Disp: , Rfl:    fluticasone (FLONASE) 50 MCG/ACT nasal spray, Place 1 spray into both nostrils daily., Disp: , Rfl:    guaifenesin (ROBITUSSIN) 100 MG/5ML syrup, Take 200 mg by mouth every 6 (six) hours as needed for cough., Disp: , Rfl:    loperamide (IMODIUM A-D) 2 MG tablet, Take 2 mg by mouth 4 (four) times daily as needed for diarrhea or loose stools. , Disp: , Rfl:    magnesium hydroxide (MILK OF MAGNESIA) 400 MG/5ML suspension, Take 22.5 mLs by mouth every 8 (eight) hours as needed (constipation (if no relief in 8 hours give with 6 oz prune juice)). , Disp: , Rfl:    metFORMIN (GLUCOPHAGE) 500 MG tablet, Take 500 mg by mouth every 12 (twelve) hours. , Disp: , Rfl:    metoprolol tartrate (LOPRESSOR) 50 MG tablet, Take 1 tablet (50 mg total) by mouth 2 (two) times daily., Disp: , Rfl:    Nutritional Supplements (NUTRITIONAL SUPPLEMENT PO), Take 1 each by mouth 3 (three) times daily with meals., Disp: , Rfl:    Pollen Extracts (PROSTAT PO), Take 30 Units by mouth in the morning and at bedtime., Disp: , Rfl:    Sodium Phosphates (FLEET ENEMA RE), Place 1 enema rectally daily as needed (constipation not relieved by MOM and prune juice)., Disp: , Rfl:    vitamin C (ASCORBIC ACID) 500 MG tablet, Take 500 mg by mouth daily., Disp: , Rfl:    Exam: Current vital signs: BP (!) 135/90 (BP Location: Right Arm)   Pulse (!) 155   Temp 97.6 F (36.4 C) (Rectal)   Ht 4\' 9"  (1.448 m)   Wt 52 kg   SpO2 99%   BMI 24.81 kg/m  Vital signs in last 24 hours: Temp:  [97.6 F (36.4 C)] 97.6 F (36.4 C) (05/29 2340) Pulse Rate:  [137-155] 155 (05/29 2345) BP: (135-140)/(90-115) 135/90 (05/29 2345) SpO2:  [99 %-100 %] 99 % (05/29 2345) Weight:  [52 kg] 52 kg (05/30 0044)  General: Awake and alert HEENT:: Normocephalic/atraumatic CVS: Irregularly irregular Neurological exam Mental status, speech and language: She is awake, attempts  to talk but is very severely dysarthric.  She is able to follow some simple commands although inconsistently Difficult to assess her orientation questions because of the severe dysarthria Cranial nerve examination: Pupils are equal round reactive to light, she is able to look at both sides, blinks to threat inconsistently from both sides, face with left lower facial weakness which has been documented in 2022 as well-unclear if this was resolved and has recurred or not. Motor examination: Right upper extremity nearly 4+/5, right lower extremity 4 -/5 with drift Left upper and lower extremity 4 -/5.  Increased tone in all fours Sensation: Grimaces to noxious simulation in all fours Difficult to assess coordination NIHSS 1a Level of Conscious.: 0 1b LOC Questions: 2 1c LOC Commands: 0 2 Best Gaze: 0 3 Visual: 0  4 Facial Palsy: 1 5a Motor Arm - left: 2 5b Motor Arm - Right: 0 6a Motor Leg - Left: 1 6b Motor Leg - Right: 1 7 Limb Ataxia: 0 8 Sensory: 0 9 Best Language: 0 10 Dysarthria: 2 11 Extinct. and Inatten.: 0 TOTAL: 9   Labs I have reviewed labs in epic and the results pertinent to this consultation are:  CBC    Component Value Date/Time   WBC 18.3 (H) 09/28/2022 2337   RBC 3.97 09/28/2022 2337   HGB 13.6 09/28/2022 2342   HCT 40.0 09/28/2022 2342   PLT 259 09/28/2022 2337   MCV 96.7 09/28/2022 2337   MCH 31.5 09/28/2022 2337   MCHC 32.6 09/28/2022 2337   RDW 13.9 09/28/2022 2337   LYMPHSABS 2.0 09/28/2022 2337   MONOABS 0.9 09/28/2022 2337   EOSABS 0.0 09/28/2022 2337   BASOSABS 0.0 09/28/2022 2337    CMP     Component Value Date/Time   NA 141 09/28/2022 2342   K 2.8 (L) 09/28/2022 2342   CL 98 09/28/2022 2342   CO2 28 09/28/2022 2337   GLUCOSE 166 (H) 09/28/2022 2342   BUN 16 09/28/2022 2342   CREATININE 0.40 (L) 09/28/2022 2342   CALCIUM 9.9 09/28/2022 2337   PROT 7.7 09/28/2022 2337   ALBUMIN 3.8 09/28/2022 2337   AST 28 09/28/2022 2337   ALT 23  09/28/2022 2337   ALKPHOS 71 09/28/2022 2337   BILITOT 0.6 09/28/2022 2337   GFRNONAA >60 09/28/2022 2337   GFRAA >60 07/08/2019 1947    Lipid Panel     Component Value Date/Time   CHOL 200 03/21/2020 2320   TRIG 61 03/21/2020 2320   HDL 50 03/21/2020 2320   CHOLHDL 4.0 03/21/2020 2320   VLDL 12 03/21/2020 2320   LDLCALC 138 (H) 03/21/2020 2320   Imaging I have reviewed the images obtained:  CT head with no acute changes.  There is a question of a small hyperdensity in the left pons which is the area of her old stroke.  This could represent calcification versus laminar necrosis. CTA head and neck with no emergent LVO.  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  Nonsignificant stenosis in the right and left carotids in the neck.  Prior MRIs with atrophy and extensive chronic microhemorrhages-sequelae of uncontrolled hypertension and small vessel risk factors.  Stat MRI-incomplete without SWI images.  No evidence of acute stroke or bleed.  Assessment:  67 year old with prior history of ICH and poor baseline with dysarthria and weakness worse on the left than right at baseline presenting for evaluation of worsening slurred speech and worsening weakness.  On examination it is unclear how much of her deficits are old versus new. Taken for stat MRI which does not show any evidence of an acute stroke.  She did not stay still for the whole study.  No evidence of acute stroke or ICH. Has a leukocytosis-wonder if this is recrudescence of old symptoms in the setting of an underlying infection or recrudescence of stroke symptoms in the setting of acute illness-new onset A-fib.   Impression:  Possible recrudescence of old stroke symptoms in the setting of an underlying systemic infection. Possible small stroke in the setting of A-fib-will need anticoagulation decisions going forward.  Recommendations:  Check UA chest x-ray-treat if abnormal  New onset A-fib-she is essentially bedbound at  this time per her daughter.  I discussed with her that she may need anticoagulation if the diagnosis of A-fib is confirmed but that  would put her at extremely high risk for hemorrhage in the brain because of presence of chronic microhemorrhages and prior ICH and I recommend that we do not pursue anticoagulation given that her functional status right now is extremely poor with a modified Rankin score of 5.  Daughter will be arriving to the hospital in the morning.  I would recommend to have detailed conversations explaining that the role of anticoagulation is to prevent ischemic strokes but in her case that would lead to a very high chance of hemorrhagic stroke.  If she still feels that keeping the patient on blood thinners is something that they would like to do, I would defer this to the family decision making.  In my mind, I do not think there is significant benefit of anticoagulating this patient.  Please reach out to neurology with questions as needed  -- Milon Dikes, MD Neurologist [facility] Pager: [phone]

## 2022-09-29 NOTE — ED Notes (Signed)
ED TO INPATIENT HANDOFF REPORT  ED Nurse Name and Phone #: Asani Deniston/ [phone]  S Name/Age/Gender Brenda Riggs 67 y.o. female Room/Bed: 029C/029C  Code Status   Code Status: Full Code  Home/SNF/Other Skilled nursing facility Patient oriented to: A&Ox0 Is this baseline?  Unknown due to AMS  Triage Complete: Triage complete  Chief Complaint Atrial fibrillation with RVR (HCC) [I48.91]  Triage Note Pt BIB [facility] from [facility] for L sided weakness. Pt has slurred speech and difficulty swallowing at baseline.   EMS VS BP 147/92 HR 110-170   Allergies Allergies  Allergen Reactions   Penicillins Other (See Comments)    Per caregiver. Unknown reaction Did it involve swelling of the face/tongue/throat, SOB, or low BP? Unknown Did it involve sudden or severe rash/hives, skin peeling, or any reaction on the inside of your mouth or nose? Unknown Did you need to seek medical attention at a hospital or doctor's office? Unknown When did it last happen?    unknown   If all above answers are "NO", may proceed with cephalosporin use.   Thioridazine Hcl Other (See Comments)    Present in Epic; not noted on MAR    Level of Care/Admitting Diagnosis ED Disposition     ED Disposition  Admit   Condition  --   Comment  Hospital Area: [facility] [[id number]]  Level of Care: Progressive [102]  Admit to Progressive based on following criteria: CARDIOVASCULAR & THORACIC of moderate stability with acute coronary syndrome symptoms/low risk myocardial infarction/hypertensive urgency/arrhythmias/heart failure potentially compromising stability and stable post cardiovascular intervention patients.  May place patient in observation at [facility] if equivalent level of care is available:: No  Covid Evaluation: Asymptomatic - no recent exposure (last 10 days) testing not required  Diagnosis: Atrial fibrillation with RVR [facility]) [[id number]]  Admitting  Physician: Briscoe Deutscher [[id number]]  Attending Physician: Briscoe Deutscher [[id number]]          B Medical/Surgery History Past Medical History:  Diagnosis Date   Allergy    Asthma    Blood transfusion without reported diagnosis    Chronic kidney disease    COPD (chronic obstructive pulmonary disease) (HCC)    Depression    Diabetes mellitus without complication (HCC)    Hyperlipidemia    Hypertension    Stroke [facility])    Past Surgical History:  Procedure Laterality Date   ABDOMINAL HYSTERECTOMY     APPENDECTOMY     CESAREAN SECTION     TUBAL LIGATION       A IV Location/Drains/Wounds Patient Lines/Drains/Airways Status     Active Line/Drains/Airways     Name Placement date Placement time Site Days   Peripheral IV 09/29/22 18 G Left Antecubital 09/29/22  0044  Antecubital  less than 1   Peripheral IV 09/29/22 20 G Right Antecubital 09/29/22  0116  Antecubital  less than 1   Pressure Injury 03/22/20 Coccyx Stage 2 -  Partial thickness loss of dermis presenting as a shallow open injury with a red, pink wound bed without slough. 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 foul odor round stage 4 with yellow slough 08/16/20  0100  -- 774   Wound / Incision (Open or Dehisced) 03/21/20 Other (Comment) Ischial tuberosity Right Stage II pressure injury 03/21/20  1803  Ischial tuberosity  922  Wound / Incision (Open or Dehisced) 03/21/20 Other (Comment) Buttocks Left Stage III Pressure Injury 03/21/20  1805  Buttocks  922            Intake/Output Last 24 hours  Intake/Output Summary (Last 24 hours) at 09/29/2022 0316 Last data filed at 09/29/2022 1610 Gross per 24 hour  Intake 169.11 ml  Output --  Net 169.11 ml    Labs/Imaging Results for orders placed or performed during the hospital encounter of 09/28/22 (from the past 48 hour(s))  CBG monitoring, ED     Status:  Abnormal   Collection Time: 09/28/22 11:35 PM  Result Value Ref Range   Glucose-Capillary 154 (H) 70 - 99 mg/dL    Comment: Glucose reference range applies only to samples taken after fasting for at least 8 hours.  Ethanol     Status: None   Collection Time: 09/28/22 11:37 PM  Result Value Ref Range   Alcohol, Ethyl (B) <10 <10 mg/dL    Comment: (NOTE) Lowest detectable limit for serum alcohol is 10 mg/dL.  For medical purposes only. Performed at [facility], [address], [place], [place] [address]   Protime-INR     Status: None   Collection Time: 09/28/22 11:37 PM  Result Value Ref Range   Prothrombin Time 12.9 11.4 - 15.2 seconds   INR 1.0 0.8 - 1.2    Comment: (NOTE) INR goal varies based on device and disease states. Performed at [facility], [address], [place], [place] [address]   APTT     Status: None   Collection Time: 09/28/22 11:37 PM  Result Value Ref Range   aPTT 26 24 - 36 seconds    Comment: Performed at [facility], [address], [place], [place] [address]  CBC     Status: Abnormal   Collection Time: 09/28/22 11:37 PM  Result Value Ref Range   WBC 18.3 (H) 4.0 - 10.5 K/uL   RBC 3.97 3.87 - 5.11 MIL/uL   Hemoglobin 12.5 12.0 - 15.0 g/dL   HCT 82.9 56.2 - 13.0 %   MCV 96.7 80.0 - 100.0 fL   MCH 31.5 26.0 - 34.0 pg   MCHC 32.6 30.0 - 36.0 g/dL   RDW 86.5 78.4 - 69.6 %   Platelets 259 150 - 400 K/uL   nRBC 0.0 0.0 - 0.2 %    Comment: Performed at [facility], [address] [place], [place] [address]  Differential     Status: Abnormal   Collection Time: 09/28/22 11:37 PM  Result Value Ref Range   Neutrophils Relative % 84 %   Neutro Abs 15.3 (H) 1.7 - 7.7 K/uL   Lymphocytes Relative 11 %   Lymphs Abs 2.0 0.7 - 4.0 K/uL   Monocytes Relative 5 %   Monocytes Absolute 0.9 0.1 - 1.0 K/uL   Eosinophils Relative 0 %   Eosinophils Absolute 0.0 0.0 - 0.5 K/uL   Basophils Relative 0 %   Basophils Absolute 0.0 0.0 - 0.1  K/uL   Immature Granulocytes 0 %   Abs Immature Granulocytes 0.05 0.00 - 0.07 K/uL    Comment: Performed at [facility], [address], [place], [place] [address]  Comprehensive metabolic panel     Status: Abnormal   Collection Time: 09/28/22 11:37 PM  Result Value Ref Range   Sodium 141 135 - 145 mmol/L   Potassium 2.8 (L) 3.5 - 5.1 mmol/L   Chloride  98 98 - 111 mmol/L   CO2 28 22 - 32 mmol/L   Glucose, Bld 165 (H) 70 - 99 mg/dL    Comment: Glucose reference range applies only to samples taken after fasting for at least 8 hours.   BUN 15 8 - 23 mg/dL   Creatinine, Ser 1.61 0.44 - 1.00 mg/dL   Calcium 9.9 8.9 - 09.6 mg/dL   Total Protein 7.7 6.5 - 8.1 g/dL   Albumin 3.8 3.5 - 5.0 g/dL   AST 28 15 - 41 U/L   ALT 23 0 - 44 U/L   Alkaline Phosphatase 71 38 - 126 U/L   Total Bilirubin 0.6 0.3 - 1.2 mg/dL   GFR, Estimated >04 >54 mL/min    Comment: (NOTE) Calculated using the CKD-EPI Creatinine Equation (2021)    Anion gap 15 5 - 15    Comment: Performed at [facility], [address], [place], [place] [address]  I-stat chem 8, ED     Status: Abnormal   Collection Time: 09/28/22 11:42 PM  Result Value Ref Range   Sodium 141 135 - 145 mmol/L   Potassium 2.8 (L) 3.5 - 5.1 mmol/L   Chloride 98 98 - 111 mmol/L   BUN 16 8 - 23 mg/dL   Creatinine, Ser 9.14 (L) 0.44 - 1.00 mg/dL   Glucose, Bld 782 (H) 70 - 99 mg/dL    Comment: Glucose reference range applies only to samples taken after fasting for at least 8 hours.   Calcium, Ion 1.12 (L) 1.15 - 1.40 mmol/L   TCO2 32 22 - 32 mmol/L   Hemoglobin 13.6 12.0 - 15.0 g/dL   HCT 95.6 21.3 - 08.6 %   MR BRAIN WO CONTRAST  Result Date: 09/29/2022 CLINICAL DATA:  Stroke suspected, slurred speech, left-sided weakness EXAM: MRI HEAD WITHOUT CONTRAST TECHNIQUE: Multiplanar, multiecho pulse sequences of the brain and surrounding structures were obtained without intravenous contrast. COMPARISON:  08/15/2020 MRI head, correlation is  also made with 09/28/2022 CT head FINDINGS: Evaluation is somewhat limited by the absence of a dedicated susceptibility weighted sequence, which could not be obtained due to patient noncooperation. Brain: No restricted diffusion to suggest acute or subacute infarct. No definite hemorrhage is seen to correlate with the suspected hyperdense focus on the 09/28/2022 CT, which likely reflects calcifications related to prior hemorrhage. No mass, mass effect, or midline shift. No hydrocephalus or extra-axial collection. Partial empty sella. Normal craniocervical junction. Numerous remote lacunar infarcts in the pons bilateral thalami, right greater than left basal ganglia, and bilateral corona radiata. Although a SWI or GRE sequence was not obtained, foci of susceptibility compatible with hemosiderin deposition are noted on the T2 weighted sequence in the pons, midbrain, and medulla. Confluent T2 hyperintense signal in the periventricular white matter, likely the sequela of moderate chronic small vessel ischemic disease. Advanced cerebral volume loss for age. Vascular: Normal arterial flow voids. Skull and upper cervical spine: Normal marrow signal. Sinuses/Orbits: Clear paranasal sinuses. No acute finding in the orbits. Other: The mastoids are well aerated. IMPRESSION: Evaluation is somewhat limited by the absence of a dedicated susceptibility weighted sequence, which could not be obtained due to patient noncooperation. Within this limitation, no acute intracranial process. No evidence acute infarct or hemorrhage. Electronically Signed   By: Wiliam Ke M.D.   On: 09/29/2022 01:31   DG Chest Port 1 View  Result Date: 09/29/2022 CLINICAL DATA:  Encounter for screening. EXAM: PORTABLE CHEST 1 VIEW COMPARISON:  August 15, 2020  FINDINGS: The heart size and mediastinal contours are within normal limits. There is marked severity calcification of the aortic arch. Low lung volumes are noted. There is no evidence of an acute  infiltrate, pleural effusion or pneumothorax. Radiopaque contrast is seen within the visualized portions of the renal collecting systems. Multilevel degenerative changes seen throughout the thoracic spine. IMPRESSION: No active disease. Electronically Signed   By: Aram Candela M.D.   On: 09/29/2022 01:30   CT ANGIO HEAD NECK W WO CM (CODE STROKE)  Result Date: 09/29/2022 CLINICAL DATA:  Slurred speech, worsening left-sided weakness EXAM: CT ANGIOGRAPHY HEAD AND NECK WITH AND WITHOUT CONTRAST TECHNIQUE: Multidetector CT imaging of the head and neck was performed using the standard protocol during bolus administration of intravenous contrast. Multiplanar CT image reconstructions and MIPs were obtained to evaluate the vascular anatomy. Carotid stenosis measurements (when applicable) are obtained utilizing NASCET criteria, using the distal internal carotid diameter as the denominator. RADIATION DOSE REDUCTION: This exam was performed according to the departmental dose-optimization program which includes automated exposure control, adjustment of the mA and/or kV according to patient size and/or use of iterative reconstruction technique. CONTRAST:  75 mL Omnipaque 350 COMPARISON:  No prior CTA available, correlation is made with MRA 03/21/2020 FINDINGS: CT HEAD FINDINGS For noncontrast findings, please see same day CT head. CTA NECK FINDINGS Aortic arch: Standard branching. Imaged portion shows no evidence of aneurysm or dissection. No significant stenosis of the major arch vessel origins. Aortic atherosclerosis. Right carotid system: No evidence of dissection, occlusion, or hemodynamically significant stenosis (greater than 50%). Left carotid system: No evidence of dissection, occlusion, or hemodynamically significant stenosis (greater than 50%). Vertebral arteries: No evidence of dissection, occlusion, or hemodynamically significant stenosis (greater than 50%). Skeleton: No acute osseous abnormality.  Degenerative changes in the cervical spine. Other neck: Negative. Upper chest: No focal pulmonary opacity or pleural effusion. Emphysema. Review of the MIP images confirms the above findings CTA HEAD FINDINGS Anterior circulation: Both internal carotid arteries are patent to the termini, without significant stenosis. A1 segments patent. Normal anterior communicating artery. Anterior cerebral arteries are patent to their distal aspects without significant stenosis. No M1 stenosis or occlusion. MCA branches perfused to their distal aspects without significant stenosis. Posterior circulation: Vertebral arteries patent to the vertebrobasilar junction without significant stenosis. The right PICA is patent proximally. Common origin of the left AICA and PICA. Basilar patent to its distal aspect without significant stenosis. Superior cerebellar arteries patent proximally. Patent P1 segments. PCAs perfused to their distal aspects with mild multifocal narrowing throughout. The bilateral posterior communicating arteries are not visualized. Venous sinuses: Not well opacified due to arterial timing. Anatomic variants: None significant. Review of the MIP images confirms the above findings IMPRESSION: 1. No intracranial large vessel occlusion or significant stenosis. 2. No hemodynamically significant stenosis in the neck. 3. Aortic atherosclerosis. Aortic Atherosclerosis (ICD10-I70.0). Electronically Signed   By: Wiliam Ke M.D.   On: 09/29/2022 00:03   CT HEAD CODE STROKE WO CONTRAST  Result Date: 09/28/2022 CLINICAL DATA:  Code stroke. Slurred speech, worsening left-sided weakness, prior strokes EXAM: CT HEAD WITHOUT CONTRAST TECHNIQUE: Contiguous axial images were obtained from the base of the skull through the vertex without intravenous contrast. RADIATION DOSE REDUCTION: This exam was performed according to the departmental dose-optimization program which includes automated exposure control, adjustment of the mA and/or  kV according to patient size and/or use of iterative reconstruction technique. COMPARISON:  08/16/2020 FINDINGS: Brain: Possible small focus of hyperdensity at the  left aspect of the pons (series 2, image 11 and series 6, image 28), which could represent hemorrhage, calcification, or laminar necrosis. No evidence of acute infarction, mass, mass effect, or midline shift. No hydrocephalus or extra-axial collection. Vascular: No hyperdense vessel. Skull: Negative for fracture or focal lesion. Sinuses/Orbits: No acute finding. Other: The mastoid air cells are well aerated. ASPECTS (Alberta Stroke Program Early CT Score) - Ganglionic level infarction (caudate, lentiform nuclei, internal capsule, insula, M1-M3 cortex): 7 - Supraganglionic infarction (M4-M6 cortex): 3 Total score (0-10 with 10 being normal): 10 IMPRESSION: Possible small focus of hyperdensity at the left aspect of the pons, which could represent hemorrhage, calcification, or laminar necrosis. Attention on subsequent CTA and MRI. These findings were discussed on 09/28/2022 at 11:54 pm with provider ARORA. Electronically Signed   By: Wiliam Ke M.D.   On: 09/28/2022 23:54    Pending Labs Unresulted Labs (From admission, onward)     Start     Ordered   10/06/22 0500  Creatinine, serum  (enoxaparin (LOVENOX)    CrCl >/= 30 ml/min)  Weekly,   R     Comments: while on enoxaparin therapy    09/29/22 0237   09/29/22 0500  HIV Antibody (routine testing w rflx)  (HIV Antibody (Routine testing w reflex) panel)  Tomorrow morning,   R        09/29/22 0237   09/29/22 0500  Basic metabolic panel  Daily,   R      09/29/22 0237   09/29/22 0500  Magnesium  Tomorrow morning,   R        09/29/22 0237   09/29/22 0500  CBC  Daily,   R      09/29/22 0237   09/29/22 0500  TSH  Tomorrow morning,   R        09/29/22 0237   09/28/22 2334  Urine rapid drug screen (hosp performed)  Once,   STAT        09/28/22 2334   09/28/22 2334  Urinalysis, Routine w reflex  microscopic -Urine, Clean Catch  Once,   URGENT       Question:  Specimen Source  Answer:  Urine, Clean Catch   09/28/22 2334            Vitals/Pain Today's Vitals   09/29/22 0044 09/29/22 0130 09/29/22 0230 09/29/22 0306  BP:  139/65 (!) 146/79   Pulse:  (!) 128    Resp:  (!) 23 15 17   Temp:      TempSrc:      SpO2:      Weight: 52 kg     Height: 4\' 9"  (1.448 m)       Isolation Precautions No active isolations  Medications Medications  diltiazem (CARDIZEM) 1 mg/mL load via infusion 20 mg (20 mg Intravenous Bolus from Bag 09/29/22 0115)    And  diltiazem (CARDIZEM) 125 mg in dextrose 5% 125 mL (1 mg/mL) infusion (15 mg/hr Intravenous Rate/Dose Change 09/29/22 0300)  potassium chloride 10 mEq in 100 mL IVPB ( Intravenous Rate/Dose Change 09/29/22 0314)  enoxaparin (LOVENOX) injection 40 mg (has no administration in time range)  sodium chloride flush (NS) 0.9 % injection 3 mL (3 mLs Intravenous Not Given 09/29/22 0259)  acetaminophen (TYLENOL) tablet 650 mg (has no administration in time range)    Or  acetaminophen (TYLENOL) suppository 650 mg (has no administration in time range)  ondansetron (ZOFRAN) tablet 4 mg (has no administration in time range)  Or  ondansetron (ZOFRAN) injection 4 mg (has no administration in time range)  insulin aspart (novoLOG) injection 0-6 Units (has no administration in time range)  iohexol (OMNIPAQUE) 350 MG/ML injection 75 mL (75 mLs Intravenous Contrast Given 09/28/22 2354)    Mobility non-ambulatory     Focused Assessments Neuro Assessment Handoff:  Swallow screen pass? Has difficulty swallowing at baseline.   NIH Stroke Scale  Dizziness Present: No Headache Present: No Interval: Shift assessment Level of Consciousness (1a.)   : Not alert, but arousable by minor stimulation to obey, answer, or respond LOC Questions (1b. )   : Answers neither question correctly LOC Commands (1c. )   : Performs both tasks correctly Best Gaze (2. )   : Normal Visual (3. )  : No visual loss Facial Palsy (4. )    : Normal symmetrical movements Motor Arm, Left (5a. )   : No effort against gravity Motor Arm, Right (5b. ) : Drift Motor Leg, Left (6a. )  : No effort against gravity Motor Leg, Right (6b. ) : No effort against gravity Limb Ataxia (7. ): Absent Sensory (8. )  : Normal, no sensory loss Best Language (9. )  : Severe aphasia Dysarthria (10. ): Severe dysarthria, patient's speech is so slurred as to be unintelligible in the absence of or out of proportion to any dysphasia, or is mute/anarthric Extinction/Inattention (11.)   : No Abnormality Complete NIHSS TOTAL: 17... Next one due at 330 Last date known well: 09/28/22 Last time known well: 2230 Neuro Assessment: Exceptions to [facility] Neuro Checks: Q2 hrs.. next one due at 330  Initial (09/28/22 2340)  Has TPA been given? No If patient is a Neuro Trauma and patient is going to OR before floor call report to 4N Charge nurse: [phone] or [phone]   R Recommendations: See Admitting Provider Note  Report given to:   Additional Notes: Pt came in from [facility] for more L sided weakness, lethargic, and more slurred speech. Pt has slurred speech and difficulty swallowing at baseline. K is 2.8 and they are getting potassium runs. HR has been elevated in the 140s and they are on a cardizem drip at 15. Pt wears a brief.

## 2022-09-29 NOTE — Progress Notes (Signed)
   09/29/22 2230  BiPAP/CPAP/SIPAP  Reason BIPAP/CPAP not in use Other(comment)   Pt said does not wear cpap at home

## 2022-09-29 NOTE — Evaluation (Signed)
Clinical/Bedside Swallow Evaluation Patient Details  Name: LATRENA NICODEMUS MRN: [id number] Date of Birth: 23-Nov-1955  Today's Date: 09/29/2022 Time: SLP Start Time (ACUTE ONLY): 1155 SLP Stop Time (ACUTE ONLY): 1220 SLP Time Calculation (min) (ACUTE ONLY): 25 min  Past Medical History:  Past Medical History:  Diagnosis Date   Allergy    Asthma    Blood transfusion without reported diagnosis    Chronic kidney disease    COPD (chronic obstructive pulmonary disease) (HCC)    Depression    Diabetes mellitus without complication (HCC)    Hyperlipidemia    Hypertension    Stroke [facility])    Past Surgical History:  Past Surgical History:  Procedure Laterality Date   ABDOMINAL HYSTERECTOMY     APPENDECTOMY     CESAREAN SECTION     TUBAL LIGATION     HPI:  67 year old with prior history of ICH and poor baseline with dysarthria and weakness worse on the left than right at baseline presenting for evaluation of worsening slurred speech and worsening weakness.  MRI which does not show any evidence of an acute stroke.  Per MD, Has a leukocytosis-wonder if this is recrudescence of old symptoms in the setting of an underlying infection or recrudescence of stroke symptoms in the setting of acute illness-new onset A-fib. Most recent MBS complete 08/20/20 indicated a  severe oral and mild pharyngeal dysphagia mostly c/b decreased oral control from significant weakness, with recommendations for dysphagia 1 with thin liquid.  MBs also in 2021 with recommendations for dys 3, thin liquid.    Assessment / Plan / Recommendation  Clinical Impression  Bedside swallow evaluation complete. Patient presents with what appears to be a rhythmic pulsing of the base of her tongue that is not 100% detectable when she is sleeping but palpable and audible during inhalation/exhalations, attempting to speak, and attempting to swallow. Ice chips provided with poor oral management of bolus and inability to initiate a palpable  pharyngeal swallow. Poor ability to manage secretions noted with audible movement of secretions suspected on vocal cords. Unclear if this is baseline or not. It is not documented specifically in MBS notes from 2022. She did have a significant oral dysphagia at that time but was able to consume pureed solids and thin liquids. Current recommendations for for NPO. RN in room and present for education. Contacted MD via secure chat to explain findings. SLP will f/u. SLP Visit Diagnosis: Dysphagia, oropharyngeal phase (R13.12)    Aspiration Risk  Severe aspiration risk    Diet Recommendation NPO   Medication Administration: Via alternative means    Other  Recommendations Oral Care Recommendations: Oral care QID    Recommendations for follow up therapy are one component of a multi-disciplinary discharge planning process, led by the attending physician.  Recommendations may be updated based on patient status, additional functional criteria and insurance authorization.  Follow up Recommendations Skilled nursing-short term rehab (<3 hours/day)      Assistance Recommended at Discharge    Functional Status Assessment Patient has had a recent decline in their functional status and demonstrates the ability to make significant improvements in function in a reasonable and predictable amount of time.  Frequency and Duration min 2x/week  2 weeks       Prognosis Prognosis for improved oropharyngeal function: Guarded Barriers to Reach Goals: Cognitive deficits;Severity of deficits      Swallow Study   General HPI: 67 year old with prior history of ICH and poor baseline with dysarthria and weakness worse on  the left than right at baseline presenting for evaluation of worsening slurred speech and worsening weakness.  MRI which does not show any evidence of an acute stroke.  Per MD, Has a leukocytosis-wonder if this is recrudescence of old symptoms in the setting of an underlying infection or recrudescence  of stroke symptoms in the setting of acute illness-new onset A-fib. Most recent MBS complete 08/20/20 indicated a  severe oral and mild pharyngeal dysphagia mostly c/b decreased oral control from significant weakness, with recommendations for dysphagia 1 with thin liquid.  MBs also in 2021 with recommendations for dys 3, thin liquid. Type of Study: Bedside Swallow Evaluation Previous Swallow Assessment: see hpi Diet Prior to this Study: NPO Temperature Spikes Noted: No Respiratory Status: Room air History of Recent Intubation: No Behavior/Cognition: Alert;Cooperative Oral Cavity Assessment: Excessive secretions Oral Care Completed by SLP: Yes Oral Cavity - Dentition: Adequate natural dentition Vision: Functional for self-feeding Self-Feeding Abilities: Needs assist Patient Positioning: Upright in bed Baseline Vocal Quality: Low vocal intensity;Wet Volitional Cough: Cognitively unable to elicit Volitional Swallow: Unable to elicit    Oral/Motor/Sensory Function Overall Oral Motor/Sensory Function: Other (comment) (see impression statement)   Ice Chips Ice chips: Impaired Presentation: Spoon Oral Phase Impairments: Reduced labial seal;Reduced lingual movement/coordination;Impaired mastication Oral Phase Functional Implications: Prolonged oral transit;Oral residue;Oral holding Other Comments: see impression statement   Thin Liquid Thin Liquid: Not tested    Nectar Thick Nectar Thick Liquid: Not tested   Honey Thick Honey Thick Liquid: Not tested   Puree Puree: Not tested   Solid     Solid: Not tested     Ferdinand Lango MA, CCC-SLP  Magdalyn Arenivas Meryl 09/29/2022,12:27 PM

## 2022-09-29 NOTE — Progress Notes (Signed)
Initial Nutrition Assessment  DOCUMENTATION CODES:   Non-severe (moderate) malnutrition in context of chronic illness  INTERVENTION:  Initiate tube feeding via Cortrak once placed and confirmed by xray: Start Osmolite 1.5 at 29ml/hr and advance by 10ml q8h to a goal rate of 35 ml/h (840 ml per day) Prosource TF20 60 ml once daily Free water flushes of q4h  Provides 1340 kcal, 73 gm protein, 1300 ml free water daily  Pt is likely at risk for refeeding syndrome, recommend monitoring potassium, magnesium and phosphorus BID x5 days following initiation of nutrition support  Recommend thiamine 100mg  x5 days  NUTRITION DIAGNOSIS:   Moderate Malnutrition related to chronic illness as evidenced by mild fat depletion, mild muscle depletion.  GOAL:   Patient will meet greater than or equal to 90% of their needs  MONITOR:   Diet advancement, Labs, Weight trends, TF tolerance, Skin  REASON FOR ASSESSMENT:   Consult Assessment of nutrition requirement/status, Other (Comment) (Cortrak list)  ASSESSMENT:   Pt from SNF d/t increased L sided weakness and speech difficulty. PMH significant for asthma, T2DM, HTN, HLD, ICH with residual dysarthria.  5/30 - s/p BSE- recommend NPO d/t poor oral management of bolus and inability to initiate a palpable pharyngeal swallow; poor ability to manage secretions  Per review of H&P, pt's daughter reported she has had worsening in general debility and speech over the past few months. Requires assistance with ADL's, is bedbound and has chronic dysarthria, dysphagia and weakness.   Spoke with pt's son at bedside. He is only able to provide limited history as he does not live locally. He states that last he saw her 3-4 months ago, he was assisting to feed pt and she did well with intake. He is uncertain about weight changes.   He expresses concerns over inability to eat or drink anything. Explained that ST performed BSE earlier and noted that she is  unable to initiate swallow. Explained that PO intake would put her at risk for choking or aspiration. MD discussed Cortrak placement just prior to assessment and family is agreeable to this to meet her nutrition needs.  Unfortunately, there is limited documentation of weight history on file to review within the last year. Pt's weight noted to decline over the last 6 years from 64.9 kg (2018) and is now 50.1 kg. Uncertain whether she has had a significant change in weight recently. Will continue to monitor throughout admission.   Medications: SSI 0-6 units q4h, protonix  Labs: Mg 1.6, CBG's 126-166 x24 hours  NUTRITION - FOCUSED PHYSICAL EXAM:  Flowsheet Row Most Recent Value  Orbital Region Mild depletion  Upper Arm Region No depletion  Thoracic and Lumbar Region No depletion  Buccal Region Mild depletion  Temple Region Mild depletion  Clavicle Bone Region Mild depletion  Clavicle and Acromion Bone Region Moderate depletion  Scapular Bone Region Mild depletion  Dorsal Hand Mild depletion  [contracted]  Patellar Region Severe depletion  [bedbound, contracted]  Anterior Thigh Region Severe depletion  Posterior Calf Region Severe depletion  Edema (RD Assessment) None  Hair Reviewed  Eyes Reviewed  Mouth Unable to assess  Skin Reviewed  Nails Reviewed       Diet Order:   Diet Order             Diet NPO time specified  Diet effective now                   EDUCATION NEEDS:   Education needs have been  addressed  Skin:  Skin Assessment: Reviewed RN Assessment  Last BM:  5/30  Height:   Ht Readings from Last 1 Encounters:  09/29/22 4\' 9"  (1.448 m)    Weight:   Wt Readings from Last 1 Encounters:  09/29/22 50.1 kg   BMI:  Body mass index is 23.9 kg/m.  Estimated Nutritional Needs:   Kcal:  1300-1500  Protein:  65-80g  Fluid:  1.3-1.5L  Drusilla Kanner, RDN, LDN Clinical Nutrition

## 2022-09-29 NOTE — ED Triage Notes (Signed)
Pt BIB [facility] from [facility] for L sided weakness. Pt has slurred speech and difficulty swallowing at baseline.   EMS VS BP 147/92 HR 110-170

## 2022-09-29 NOTE — Progress Notes (Signed)
  Echocardiogram 2D Echocardiogram has been performed.  Brenda Riggs 09/29/2022, 10:50 AM

## 2022-09-30 ENCOUNTER — Inpatient Hospital Stay (HOSPITAL_COMMUNITY): Payer: Medicare Other

## 2022-09-30 DIAGNOSIS — J4540 Moderate persistent asthma, uncomplicated: Secondary | ICD-10-CM

## 2022-09-30 DIAGNOSIS — I10 Essential (primary) hypertension: Secondary | ICD-10-CM | POA: Diagnosis not present

## 2022-09-30 DIAGNOSIS — I4891 Unspecified atrial fibrillation: Secondary | ICD-10-CM | POA: Diagnosis not present

## 2022-09-30 DIAGNOSIS — Z8673 Personal history of transient ischemic attack (TIA), and cerebral infarction without residual deficits: Secondary | ICD-10-CM | POA: Diagnosis not present

## 2022-09-30 DIAGNOSIS — I480 Paroxysmal atrial fibrillation: Secondary | ICD-10-CM | POA: Diagnosis not present

## 2022-09-30 LAB — GLUCOSE, CAPILLARY
Glucose-Capillary: 103 mg/dL — ABNORMAL HIGH (ref 70–99)
Glucose-Capillary: 109 mg/dL — ABNORMAL HIGH (ref 70–99)
Glucose-Capillary: 124 mg/dL — ABNORMAL HIGH (ref 70–99)
Glucose-Capillary: 125 mg/dL — ABNORMAL HIGH (ref 70–99)
Glucose-Capillary: 145 mg/dL — ABNORMAL HIGH (ref 70–99)

## 2022-09-30 LAB — COMPREHENSIVE METABOLIC PANEL
ALT: 19 U/L (ref 0–44)
AST: 25 U/L (ref 15–41)
Albumin: 3.3 g/dL — ABNORMAL LOW (ref 3.5–5.0)
Alkaline Phosphatase: 65 U/L (ref 38–126)
Anion gap: 11 (ref 5–15)
BUN: 24 mg/dL — ABNORMAL HIGH (ref 8–23)
CO2: 29 mmol/L (ref 22–32)
Calcium: 9.2 mg/dL (ref 8.9–10.3)
Chloride: 97 mmol/L — ABNORMAL LOW (ref 98–111)
Creatinine, Ser: 1.21 mg/dL — ABNORMAL HIGH (ref 0.44–1.00)
GFR, Estimated: 50 mL/min — ABNORMAL LOW (ref >60)
Glucose, Bld: 139 mg/dL — ABNORMAL HIGH (ref 70–99)
Potassium: 4.8 mmol/L (ref 3.5–5.1)
Sodium: 137 mmol/L (ref 135–145)
Total Bilirubin: 0.7 mg/dL (ref 0.3–1.2)
Total Protein: 7 g/dL (ref 6.5–8.1)

## 2022-09-30 LAB — CBC
HCT: 33.1 % — ABNORMAL LOW (ref 36.0–46.0)
Hemoglobin: 11 g/dL — ABNORMAL LOW (ref 12.0–15.0)
MCH: 30.1 pg (ref 26.0–34.0)
MCHC: 33.2 g/dL (ref 30.0–36.0)
MCV: 90.4 fL (ref 80.0–100.0)
Platelets: 349 10*3/uL (ref 150–400)
RBC: 3.66 MIL/uL — ABNORMAL LOW (ref 3.87–5.11)
RDW: 14.2 % (ref 11.5–15.5)
WBC: 5.7 10*3/uL (ref 4.0–10.5)
nRBC: 0 % (ref 0.0–0.2)

## 2022-09-30 LAB — MAGNESIUM: Magnesium: 2.4 mg/dL (ref 1.7–2.4)

## 2022-09-30 MED ORDER — ORAL CARE MOUTH RINSE
15.0000 mL | OROMUCOSAL | Status: DC
  Administered 2022-09-30 – 2022-10-11 (×45): 15 mL via OROMUCOSAL

## 2022-09-30 MED ORDER — ORAL CARE MOUTH RINSE: 15.0000 mL | OROMUCOSAL | Status: DC | PRN

## 2022-09-30 NOTE — Consult Note (Signed)
Brenda Riggs January 24, 1956  [id number].    Requesting MD: Dr. Sharl Ma Chief Complaint/Reason for Consult: Vomiting   HPI: Brenda Riggs is a 67 y.o. female with a hx of asthma, type 2 diabetes mellitus, hypertension, hyperlipidemia, prior ICH with residual dysarthria and bedbound who resides at SNF who we were asked to see for vomiting.   Patient admitted 5/30 for worsened weakness and worsened speech difficulty. She underwent w/u and was seen by Neurology who felt she had possible recrudescence of old stroke symptoms in the setting of an underlying systemic infection vs possible small stroke. Also noted to be in new onset A. Fib w/ RVR here tx w/ Dilt gtt. She is not on blood thinners. Patient had speech eval and was recommended to be npo. Cortrak was placed and films noted multiple dilated loops of small bowel with decompressed colon suggestive of small-bowel obstruction vs ileus. TRH noted patient did have some vomiting 5/30 and no bm since admission. We were asked to see. Patient reports no further n/v today but has bloating/distension and some upper abdominal pain. She is unsure when her last episode of flatus or bm was. No hx of prior sbo's. Prior abdominal surgeries include C-section, tubal ligation, abdominal hysterectomy and appendectomy.   ROS: ROS As above, see hpi  Family History  Problem Relation Age of Onset   Heart disease Mother     Past Medical History:  Diagnosis Date   Allergy    Asthma    Blood transfusion without reported diagnosis    Chronic kidney disease    COPD (chronic obstructive pulmonary disease) (HCC)    Depression    Diabetes mellitus without complication (HCC)    Hyperlipidemia    Hypertension    Stroke [facility])     Past Surgical History:  Procedure Laterality Date   ABDOMINAL HYSTERECTOMY     APPENDECTOMY     CESAREAN SECTION     TUBAL LIGATION      Social History:  reports that she has been smoking cigarettes. She has a 4.10 pack-year smoking  history. She has never used smokeless tobacco. She reports that she does not drink alcohol and does not use drugs.  Allergies:  Allergies  Allergen Reactions   Penicillins Other (See Comments)    Per caregiver. Unknown reaction Did it involve swelling of the face/tongue/throat, SOB, or low BP? Unknown Did it involve sudden or severe rash/hives, skin peeling, or any reaction on the inside of your mouth or nose? Unknown Did you need to seek medical attention at a hospital or doctor's office? Unknown When did it last happen?    unknown   If all above answers are "NO", may proceed with cephalosporin use.   Thioridazine Hcl Other (See Comments)    Present in Epic; not noted on MAR    Medications Prior to Admission  Medication Sig Dispense Refill   acetaminophen (TYLENOL) 325 MG tablet Take 650 mg by mouth every 6 (six) hours as needed for headache (minor discomfort/fever up to 100 degrees orally).      albuterol (PROVENTIL HFA;VENTOLIN HFA) 108 (90 BASE) MCG/ACT inhaler Inhale 2 puffs into the lungs 4 (four) times daily as needed for wheezing or shortness of breath.      alum & mag hydroxide-simeth (MAALOX/MYLANTA) 200-200-20 MG/5ML suspension Take 30 mLs by mouth once as needed for indigestion or heartburn.     amLODipine (NORVASC) 5 MG tablet Take 1 tablet (5 mg total) by mouth daily.  atorvastatin (LIPITOR) 40 MG tablet Take 1 tablet (40 mg total) by mouth daily at 6 PM.     beclomethasone (QVAR REDIHALER) 40 MCG/ACT inhaler Inhale 2 puffs into the lungs 2 (two) times daily.     cholecalciferol (VITAMIN D3) 25 MCG (1000 UNIT) tablet Take 1,000 Units by mouth daily.     divalproex (DEPAKOTE ER) 250 MG 24 hr tablet Take 250 mg by mouth at bedtime.     fluticasone (FLONASE) 50 MCG/ACT nasal spray Place 1 spray into both nostrils daily.     guaifenesin (ROBITUSSIN) 100 MG/5ML syrup Take 200 mg by mouth every 6 (six) hours as needed for cough.     loperamide (IMODIUM A-D) 2 MG tablet Take 2  mg by mouth 4 (four) times daily as needed for diarrhea or loose stools.      magnesium hydroxide (MILK OF MAGNESIA) 400 MG/5ML suspension Take 22.5 mLs by mouth every 8 (eight) hours as needed (constipation (if no relief in 8 hours give with 6 oz prune juice)).      metFORMIN (GLUCOPHAGE) 500 MG tablet Take 500 mg by mouth every 12 (twelve) hours.      metoprolol tartrate (LOPRESSOR) 50 MG tablet Take 1 tablet (50 mg total) by mouth 2 (two) times daily.     Nutritional Supplements (NUTRITIONAL SUPPLEMENT PO) Take 1 each by mouth 3 (three) times daily with meals.     Pollen Extracts (PROSTAT PO) Take 30 Units by mouth in the morning and at bedtime.     Sodium Phosphates (FLEET ENEMA RE) Place 1 enema rectally daily as needed (constipation not relieved by MOM and prune juice).     vitamin C (ASCORBIC ACID) 500 MG tablet Take 500 mg by mouth daily.       Physical Exam: Blood pressure (!) 149/65, pulse 74, temperature 98.6 F (37 C), temperature source Axillary, resp. rate 18, height 4\' 9"  (1.448 m), weight 50.1 kg, SpO2 94 %. Gen:  Alert, NAD, pleasant Card:  RRR Pulm:  CTAB, no W/R/R, effort normal Abd: Mild to moderate distension, upper abdominal ttp diffusely. No rigidity or rebound. Hypoactive bowel sounds.   Results for orders placed or performed during the hospital encounter of 09/28/22 (from the past 48 hour(s))  CBG monitoring, ED     Status: Abnormal   Collection Time: 09/28/22 11:35 PM  Result Value Ref Range   Glucose-Capillary 154 (H) 70 - 99 mg/dL    Comment: Glucose reference range applies only to samples taken after fasting for at least 8 hours.  Ethanol     Status: None   Collection Time: 09/28/22 11:37 PM  Result Value Ref Range   Alcohol, Ethyl (B) <10 <10 mg/dL    Comment: (NOTE) Lowest detectable limit for serum alcohol is 10 mg/dL.  For medical purposes only. Performed at [facility], [address], [place], [place] [address]   Protime-INR     Status:  None   Collection Time: 09/28/22 11:37 PM  Result Value Ref Range   Prothrombin Time 12.9 11.4 - 15.2 seconds   INR 1.0 0.8 - 1.2    Comment: (NOTE) INR goal varies based on device and disease states. Performed at [facility], [address] [place], [place] [address]   APTT     Status: None   Collection Time: 09/28/22 11:37 PM  Result Value Ref Range   aPTT 26 24 - 36 seconds    Comment: Performed at [facility], [address] [place], [place] [address]  CBC     Status: Abnormal   Collection Time: 09/28/22 11:37 PM  Result Value Ref Range   WBC 18.3 (H) 4.0 - 10.5 K/uL   RBC 3.97 3.87 - 5.11 MIL/uL   Hemoglobin 12.5 12.0 - 15.0 g/dL   HCT 60.4 54.0 - 98.1 %   MCV 96.7 80.0 - 100.0 fL   MCH 31.5 26.0 - 34.0 pg   MCHC 32.6 30.0 - 36.0 g/dL   RDW 19.1 47.8 - 29.5 %   Platelets 259 150 - 400 K/uL   nRBC 0.0 0.0 - 0.2 %    Comment: Performed at [facility], [address] [place], [place] [address]  Differential     Status: Abnormal   Collection Time: 09/28/22 11:37 PM  Result Value Ref Range   Neutrophils Relative % 84 %   Neutro Abs 15.3 (H) 1.7 - 7.7 K/uL   Lymphocytes Relative 11 %   Lymphs Abs 2.0 0.7 - 4.0 K/uL   Monocytes Relative 5 %   Monocytes Absolute 0.9 0.1 - 1.0 K/uL   Eosinophils Relative 0 %   Eosinophils Absolute 0.0 0.0 - 0.5 K/uL   Basophils Relative 0 %   Basophils Absolute 0.0 0.0 - 0.1 K/uL   Immature Granulocytes 0 %   Abs Immature Granulocytes 0.05 0.00 - 0.07 K/uL    Comment: Performed at [facility], [address] [place], [place] [address]  Comprehensive metabolic panel     Status: Abnormal   Collection Time: 09/28/22 11:37 PM  Result Value Ref Range   Sodium 141 135 - 145 mmol/L   Potassium 2.8 (L) 3.5 - 5.1 mmol/L   Chloride 98 98 - 111 mmol/L   CO2 28 22 - 32 mmol/L   Glucose, Bld 165 (H) 70 - 99 mg/dL    Comment: Glucose reference range applies only to samples taken after fasting for at least 8 hours.    BUN 15 8 - 23 mg/dL   Creatinine, Ser 4.69 0.44 - 1.00 mg/dL   Calcium 9.9 8.9 - 62.9 mg/dL   Total Protein 7.7 6.5 - 8.1 g/dL   Albumin 3.8 3.5 - 5.0 g/dL   AST 28 15 - 41 U/L   ALT 23 0 - 44 U/L   Alkaline Phosphatase 71 38 - 126 U/L   Total Bilirubin 0.6 0.3 - 1.2 mg/dL   GFR, Estimated >52 >84 mL/min    Comment: (NOTE) Calculated using the CKD-EPI Creatinine Equation (2021)    Anion gap 15 5 - 15    Comment: Performed at [facility], [address], [place], [place] [address]  I-stat chem 8, ED     Status: Abnormal   Collection Time: 09/28/22 11:42 PM  Result Value Ref Range   Sodium 141 135 - 145 mmol/L   Potassium 2.8 (L) 3.5 - 5.1 mmol/L   Chloride 98 98 - 111 mmol/L   BUN 16 8 - 23 mg/dL   Creatinine, Ser 0.10 (L) 0.44 - 1.00 mg/dL   Glucose, Bld 272 (H) 70 - 99 mg/dL    Comment: Glucose reference range applies only to samples taken after fasting for at least 8 hours.   Calcium, Ion 1.12 (L) 1.15 - 1.40 mmol/L   TCO2 32 22 - 32 mmol/L   Hemoglobin 13.6 12.0 - 15.0 g/dL   HCT 53.6 64.4 - 03.4 %  Glucose, capillary     Status: Abnormal  Collection Time: 09/29/22  4:12 AM  Result Value Ref Range   Glucose-Capillary 166 (H) 70 - 99 mg/dL    Comment: Glucose reference range applies only to samples taken after fasting for at least 8 hours.   Comment 1 Notify RN    Comment 2 Document in Chart   MRSA Next Gen by PCR, Nasal     Status: None   Collection Time: 09/29/22  4:39 AM   Specimen: Nasal Mucosa; Nasal Swab  Result Value Ref Range   MRSA by PCR Next Gen NOT DETECTED NOT DETECTED    Comment: (NOTE) The GeneXpert MRSA Assay (FDA approved for NASAL specimens only), is one component of a comprehensive MRSA colonization surveillance program. It is not intended to diagnose MRSA infection nor to guide or monitor treatment for MRSA infections. Test performance is not FDA approved in patients less than 71 years old. Performed at [facility], [address] [place], [place] [address]   HIV Antibody (routine testing w rflx)     Status: None   Collection Time: 09/29/22  5:50 AM  Result Value Ref Range   HIV Screen 4th Generation wRfx Non Reactive Non Reactive    Comment: Performed at [facility], [address], [place], [place] [address]  Basic metabolic panel     Status: Abnormal   Collection Time: 09/29/22  5:50 AM  Result Value Ref Range   Sodium 136 135 - 145 mmol/L   Potassium 4.2 3.5 - 5.1 mmol/L   Chloride 96 (L) 98 - 111 mmol/L   CO2 27 22 - 32 mmol/L   Glucose, Bld 155 (H) 70 - 99 mg/dL    Comment: Glucose reference range applies only to samples taken after fasting for at least 8 hours.   BUN 12 8 - 23 mg/dL   Creatinine, Ser 6.96 0.44 - 1.00 mg/dL   Calcium 9.7 8.9 - 29.5 mg/dL   GFR, Estimated >28 >41 mL/min    Comment: (NOTE) Calculated using the CKD-EPI Creatinine Equation (2021)    Anion gap 13 5 - 15    Comment: Performed at [facility], [address] [place], [place] [address]  Magnesium     Status: Abnormal   Collection Time: 09/29/22  5:50 AM  Result Value Ref Range   Magnesium 1.6 (L) 1.7 - 2.4 mg/dL    Comment: Performed at [facility], [address], [place], [place] [address]  CBC     Status: Abnormal   Collection Time: 09/29/22  5:50 AM  Result Value Ref Range   WBC 13.8 (H) 4.0 - 10.5 K/uL   RBC 4.32 3.87 - 5.11 MIL/uL   Hemoglobin 13.0 12.0 - 15.0 g/dL   HCT 53.6 64.4 - 03.4 %   MCV 90.0 80.0 - 100.0 fL    Comment: REPEATED TO VERIFY   MCH 30.1 26.0 - 34.0 pg   MCHC 33.4 30.0 - 36.0 g/dL   RDW 74.2 59.5 - 63.8 %   Platelets 357 150 - 400 K/uL   nRBC 0.0 0.0 - 0.2 %    Comment: Performed at [facility], [address] [place], [place] [address]  TSH     Status: None   Collection Time: 09/29/22  5:50 AM  Result Value Ref Range   TSH 1.044 0.350 - 4.500 uIU/mL    Comment: Performed by a 3rd Generation assay with a functional sensitivity of <=0.01 uIU/mL. Performed at  [facility], [address] [place], [place] [address]   Glucose, capillary     Status: Abnormal   Collection Time: 09/29/22  7:47 AM  Result Value Ref Range   Glucose-Capillary 139 (H) 70 - 99 mg/dL    Comment: Glucose reference range applies only to samples taken after fasting for at least 8 hours.  Glucose, capillary     Status: Abnormal   Collection Time: 09/29/22 11:16 AM  Result Value Ref Range   Glucose-Capillary 126 (H) 70 - 99 mg/dL    Comment: Glucose reference range applies only to samples taken after fasting for at least 8 hours.  Glucose, capillary     Status: Abnormal   Collection Time: 09/29/22  5:11 PM  Result Value Ref Range   Glucose-Capillary 128 (H) 70 - 99 mg/dL    Comment: Glucose reference range applies only to samples taken after fasting for at least 8 hours.  Glucose, capillary     Status: Abnormal   Collection Time: 09/29/22  8:18 PM  Result Value Ref Range   Glucose-Capillary 134 (H) 70 - 99 mg/dL    Comment: Glucose reference range applies only to samples taken after fasting for at least 8 hours.  Glucose, capillary     Status: Abnormal   Collection Time: 09/30/22 12:10 AM  Result Value Ref Range   Glucose-Capillary 145 (H) 70 - 99 mg/dL    Comment: Glucose reference range applies only to samples taken after fasting for at least 8 hours.   Comment 1 Notify RN    Comment 2 Document in Chart   CBC     Status: Abnormal   Collection Time: 09/30/22 12:37 AM  Result Value Ref Range   WBC 5.7 4.0 - 10.5 K/uL   RBC 3.66 (L) 3.87 - 5.11 MIL/uL   Hemoglobin 11.0 (L) 12.0 - 15.0 g/dL   HCT 60.4 (L) 54.0 - 98.1 %   MCV 90.4 80.0 - 100.0 fL   MCH 30.1 26.0 - 34.0 pg   MCHC 33.2 30.0 - 36.0 g/dL   RDW 19.1 47.8 - 29.5 %   Platelets 349 150 - 400 K/uL   nRBC 0.0 0.0 - 0.2 %    Comment: Performed at [facility], [address], [place], [place] [address]  Comprehensive metabolic panel     Status: Abnormal   Collection Time: 09/30/22 12:37 AM   Result Value Ref Range   Sodium 137 135 - 145 mmol/L   Potassium 4.8 3.5 - 5.1 mmol/L   Chloride 97 (L) 98 - 111 mmol/L   CO2 29 22 - 32 mmol/L   Glucose, Bld 139 (H) 70 - 99 mg/dL    Comment: Glucose reference range applies only to samples taken after fasting for at least 8 hours.   BUN 24 (H) 8 - 23 mg/dL   Creatinine, Ser 8.65 (H) 0.44 - 1.00 mg/dL   Calcium 9.2 8.9 - 78.4 mg/dL   Total Protein 7.0 6.5 - 8.1 g/dL   Albumin 3.3 (L) 3.5 - 5.0 g/dL   AST 25 15 - 41 U/L   ALT 19 0 - 44 U/L   Alkaline Phosphatase 65 38 - 126 U/L   Total Bilirubin 0.7 0.3 - 1.2 mg/dL   GFR, Estimated 50 (L) >60 mL/min    Comment: (NOTE) Calculated using the CKD-EPI Creatinine Equation (2021)    Anion gap 11 5 - 15    Comment: Performed at [facility], [address], [place], [place] [address]  Magnesium     Status: None   Collection Time: 09/30/22 12:37 AM  Result Value Ref Range   Magnesium 2.4 1.7 - 2.4 mg/dL    Comment: Performed at [facility], [address], [place], [place] [address]  Glucose, capillary     Status: Abnormal   Collection Time: 09/30/22  4:21 AM  Result Value Ref Range   Glucose-Capillary 124 (H) 70 - 99 mg/dL    Comment: Glucose reference range applies only to samples taken after fasting for at least 8 hours.   Comment 1 Notify RN    Comment 2 Document in Chart   Glucose, capillary     Status: Abnormal   Collection Time: 09/30/22 11:43 AM  Result Value Ref Range   Glucose-Capillary 109 (H) 70 - 99 mg/dL    Comment: Glucose reference range applies only to samples taken after fasting for at least 8 hours.   DG Abd 1 View  Result Date: 09/30/2022 CLINICAL DATA:  [id number] Vomiting [id number] EXAM: ABDOMEN - 1 VIEW COMPARISON:  Radiograph 08/15/2020 FINDINGS: Feeding tube tip overlies the distal stomach/proximal duodenum. There are multiple dilated loops of small bowel in the abdomen. The colon appears decompressed. IMPRESSION: Multiple dilated loops of small bowel  with decompressed colon suggestive of small-bowel obstruction. Ileus is possible. Electronically Signed   By: Caprice Renshaw M.D.   On: 09/30/2022 13:11   DG Abd Portable 1V  Result Date: 09/30/2022 CLINICAL DATA:  Provided history: Encounter for feeding tube placement. EXAM: PORTABLE ABDOMEN - 1 VIEW COMPARISON:  Chest radiograph 09/29/2022. Abdominal radiograph 08/15/2020. FINDINGS: An enteric tube terminates in the expected location of the distal stomach/proximal duodenum. Nonspecific gaseous distension of bowel within the central abdomen at the imaged levels. No appreciable airspace consolidation within the imaged lungs. The cardiomediastinal silhouette is unchanged. Aortic atherosclerosis. IMPRESSION: 1. The enteric tube terminates in the expected location of the distal stomach/proximal duodenum. 2. Nonspecific gaseous distension of bowel within the central abdomen at the imaged levels. Consider abdominal radiographs (to include the entirety of the abdomen) for further evaluation, as clinically warranted. 3.  Aortic Atherosclerosis (ICD10-I70.0). Electronically Signed   By: Jackey Loge D.O.   On: 09/30/2022 09:58   ECHOCARDIOGRAM COMPLETE  Result Date: 09/29/2022    ECHOCARDIOGRAM REPORT   Patient Name:   Brenda Riggs Date of Exam: 09/29/2022 Medical Rec #:  [id number]      Height:       57.0 in Accession #:    [id number]     Weight:       110.4 lb Date of Birth:  Sep 13, 1955       BSA:          1.398 m Patient Age:    66 years       BP:           137/91 mmHg Patient Gender: F              HR:           80 bpm. Exam Location:  Inpatient Procedure: 2D Echo, Cardiac Doppler, Color Doppler and Intracardiac            Opacification Agent Indications:    Atrial Fibrillation I48.91  History:        Patient has prior history of Echocardiogram examinations, most                 recent 03/22/2020. Stroke, Arrythmias:Atrial Fibrillation; Risk  Factors:Hypertension, Sleep Apnea, Diabetes, Dyslipidemia  and                 Current Smoker.  Sonographer:    Lucendia Herrlich Referring Phys: [id number] TIMOTHY S OPYD IMPRESSIONS  1. Left ventricular ejection fraction, by estimation, is 65 to 70%. The left ventricle has normal function. The left ventricle has no regional wall motion abnormalities. There is moderate concentric left ventricular hypertrophy. Left ventricular diastolic parameters are indeterminate.  2. Right ventricular systolic function is normal. The right ventricular size is normal.  3. The mitral valve is normal in structure. Trivial mitral valve regurgitation.  4. The aortic valve is tricuspid. Aortic valve regurgitation is not visualized. Aortic valve sclerosis is present, with no evidence of aortic valve stenosis.  5. The inferior vena cava is normal in size with greater than 50% respiratory variability, suggesting right atrial pressure of 3 mmHg. Comparison(s): No significant change from prior study. FINDINGS  Left Ventricle: Left ventricular ejection fraction, by estimation, is 65 to 70%. The left ventricle has normal function. The left ventricle has no regional wall motion abnormalities. Definity contrast agent was given IV to delineate the left ventricular  endocardial borders. The left ventricular internal cavity size was normal in size. There is moderate concentric left ventricular hypertrophy. Left ventricular diastolic parameters are indeterminate. Right Ventricle: The right ventricular size is normal. Right vetricular wall thickness was not well visualized. Right ventricular systolic function is normal. Left Atrium: Left atrial size was normal in size. Right Atrium: Right atrial size was normal in size. Pericardium: There is no evidence of pericardial effusion. Mitral Valve: The mitral valve is normal in structure. Trivial mitral valve regurgitation. Tricuspid Valve: The tricuspid valve is normal in structure. Tricuspid valve regurgitation is trivial. Aortic Valve: The aortic valve is  tricuspid. Aortic valve regurgitation is not visualized. Aortic valve sclerosis is present, with no evidence of aortic valve stenosis. Aortic valve peak gradient measures 7.8 mmHg. Pulmonic Valve: The pulmonic valve was normal in structure. Pulmonic valve regurgitation is trivial. Aorta: The aortic root is normal in size and structure. Venous: The inferior vena cava is normal in size with greater than 50% respiratory variability, suggesting right atrial pressure of 3 mmHg. IAS/Shunts: The atrial septum is grossly normal.  LEFT VENTRICLE PLAX 2D LVIDd:         3.40 cm   Diastology LVIDs:         2.25 cm   LV e' medial:    7.15 cm/s LV PW:         1.15 cm   LV E/e' medial:  7.8 LV IVS:        1.20 cm   LV e' lateral:   10.20 cm/s LVOT diam:     1.90 cm   LV E/e' lateral: 5.5 LV SV:         43 LV SV Index:   31 LVOT Area:     2.84 cm  RIGHT VENTRICLE             IVC RV S prime:     13.20 cm/s  IVC diam: 1.20 cm LEFT ATRIUM           Index        RIGHT ATRIUM          Index LA diam:      2.95 cm 2.11 cm/m   RA Area:     9.09 cm LA Vol (A2C): 22.6 ml 16.17 ml/m  RA Volume:  13.30 ml 9.52 ml/m LA Vol (A4C): 24.9 ml 17.82 ml/m  AORTIC VALVE AV Area (Vmax): 1.95 cm AV Vmax:        140.00 cm/s AV Peak Grad:   7.8 mmHg LVOT Vmax:      96.50 cm/s LVOT Vmean:     61.200 cm/s LVOT VTI:       0.152 m  AORTA Ao Root diam: 2.80 cm Ao Asc diam:  3.00 cm MITRAL VALVE               TRICUSPID VALVE MV Area (PHT): 3.65 cm    TR Peak grad:   13.2 mmHg MV Decel Time: 208 msec    TR Vmax:        182.00 cm/s MV E velocity: 55.70 cm/s MV A velocity: 75.90 cm/s  SHUNTS MV E/A ratio:  0.73        Systemic VTI:  0.15 m                            Systemic Diam: 1.90 cm Laurance Flatten MD Electronically signed by Laurance Flatten MD Signature Date/Time: 09/29/2022/11:50:01 AM    Final    MR BRAIN WO CONTRAST  Result Date: 09/29/2022 CLINICAL DATA:  Stroke suspected, slurred speech, left-sided weakness EXAM: MRI HEAD WITHOUT  CONTRAST TECHNIQUE: Multiplanar, multiecho pulse sequences of the brain and surrounding structures were obtained without intravenous contrast. COMPARISON:  08/15/2020 MRI head, correlation is also made with 09/28/2022 CT head FINDINGS: Evaluation is somewhat limited by the absence of a dedicated susceptibility weighted sequence, which could not be obtained due to patient noncooperation. Brain: No restricted diffusion to suggest acute or subacute infarct. No definite hemorrhage is seen to correlate with the suspected hyperdense focus on the 09/28/2022 CT, which likely reflects calcifications related to prior hemorrhage. No mass, mass effect, or midline shift. No hydrocephalus or extra-axial collection. Partial empty sella. Normal craniocervical junction. Numerous remote lacunar infarcts in the pons bilateral thalami, right greater than left basal ganglia, and bilateral corona radiata. Although a SWI or GRE sequence was not obtained, foci of susceptibility compatible with hemosiderin deposition are noted on the T2 weighted sequence in the pons, midbrain, and medulla. Confluent T2 hyperintense signal in the periventricular white matter, likely the sequela of moderate chronic small vessel ischemic disease. Advanced cerebral volume loss for age. Vascular: Normal arterial flow voids. Skull and upper cervical spine: Normal marrow signal. Sinuses/Orbits: Clear paranasal sinuses. No acute finding in the orbits. Other: The mastoids are well aerated. IMPRESSION: Evaluation is somewhat limited by the absence of a dedicated susceptibility weighted sequence, which could not be obtained due to patient noncooperation. Within this limitation, no acute intracranial process. No evidence acute infarct or hemorrhage. Electronically Signed   By: Wiliam Ke M.D.   On: 09/29/2022 01:31   DG Chest Port 1 View  Result Date: 09/29/2022 CLINICAL DATA:  Encounter for screening. EXAM: PORTABLE CHEST 1 VIEW COMPARISON:  August 15, 2020  FINDINGS: The heart size and mediastinal contours are within normal limits. There is marked severity calcification of the aortic arch. Low lung volumes are noted. There is no evidence of an acute infiltrate, pleural effusion or pneumothorax. Radiopaque contrast is seen within the visualized portions of the renal collecting systems. Multilevel degenerative changes seen throughout the thoracic spine. IMPRESSION: No active disease. Electronically Signed   By: Aram Candela M.D.   On: 09/29/2022 01:30   CT ANGIO HEAD NECK W WO  CM (CODE STROKE)  Result Date: 09/29/2022 CLINICAL DATA:  Slurred speech, worsening left-sided weakness EXAM: CT ANGIOGRAPHY HEAD AND NECK WITH AND WITHOUT CONTRAST TECHNIQUE: Multidetector CT imaging of the head and neck was performed using the standard protocol during bolus administration of intravenous contrast. Multiplanar CT image reconstructions and MIPs were obtained to evaluate the vascular anatomy. Carotid stenosis measurements (when applicable) are obtained utilizing NASCET criteria, using the distal internal carotid diameter as the denominator. RADIATION DOSE REDUCTION: This exam was performed according to the departmental dose-optimization program which includes automated exposure control, adjustment of the mA and/or kV according to patient size and/or use of iterative reconstruction technique. CONTRAST:  75 mL Omnipaque 350 COMPARISON:  No prior CTA available, correlation is made with MRA 03/21/2020 FINDINGS: CT HEAD FINDINGS For noncontrast findings, please see same day CT head. CTA NECK FINDINGS Aortic arch: Standard branching. Imaged portion shows no evidence of aneurysm or dissection. No significant stenosis of the major arch vessel origins. Aortic atherosclerosis. Right carotid system: No evidence of dissection, occlusion, or hemodynamically significant stenosis (greater than 50%). Left carotid system: No evidence of dissection, occlusion, or hemodynamically significant  stenosis (greater than 50%). Vertebral arteries: No evidence of dissection, occlusion, or hemodynamically significant stenosis (greater than 50%). Skeleton: No acute osseous abnormality. Degenerative changes in the cervical spine. Other neck: Negative. Upper chest: No focal pulmonary opacity or pleural effusion. Emphysema. Review of the MIP images confirms the above findings CTA HEAD FINDINGS Anterior circulation: Both internal carotid arteries are patent to the termini, without significant stenosis. A1 segments patent. Normal anterior communicating artery. Anterior cerebral arteries are patent to their distal aspects without significant stenosis. No M1 stenosis or occlusion. MCA branches perfused to their distal aspects without significant stenosis. Posterior circulation: Vertebral arteries patent to the vertebrobasilar junction without significant stenosis. The right PICA is patent proximally. Common origin of the left AICA and PICA. Basilar patent to its distal aspect without significant stenosis. Superior cerebellar arteries patent proximally. Patent P1 segments. PCAs perfused to their distal aspects with mild multifocal narrowing throughout. The bilateral posterior communicating arteries are not visualized. Venous sinuses: Not well opacified due to arterial timing. Anatomic variants: None significant. Review of the MIP images confirms the above findings IMPRESSION: 1. No intracranial large vessel occlusion or significant stenosis. 2. No hemodynamically significant stenosis in the neck. 3. Aortic atherosclerosis. Aortic Atherosclerosis (ICD10-I70.0). Electronically Signed   By: Wiliam Ke M.D.   On: 09/29/2022 00:03   CT HEAD CODE STROKE WO CONTRAST  Result Date: 09/28/2022 CLINICAL DATA:  Code stroke. Slurred speech, worsening left-sided weakness, prior strokes EXAM: CT HEAD WITHOUT CONTRAST TECHNIQUE: Contiguous axial images were obtained from the base of the skull through the vertex without intravenous  contrast. RADIATION DOSE REDUCTION: This exam was performed according to the departmental dose-optimization program which includes automated exposure control, adjustment of the mA and/or kV according to patient size and/or use of iterative reconstruction technique. COMPARISON:  08/16/2020 FINDINGS: Brain: Possible small focus of hyperdensity at the left aspect of the pons (series 2, image 11 and series 6, image 28), which could represent hemorrhage, calcification, or laminar necrosis. No evidence of acute infarction, mass, mass effect, or midline shift. No hydrocephalus or extra-axial collection. Vascular: No hyperdense vessel. Skull: Negative for fracture or focal lesion. Sinuses/Orbits: No acute finding. Other: The mastoid air cells are well aerated. ASPECTS [facility] Stroke Program Early CT Score) - Ganglionic level infarction (caudate, lentiform nuclei, internal capsule, insula, M1-M3 cortex): 7 - Supraganglionic infarction (M4-M6  cortex): 3 Total score (0-10 with 10 being normal): 10 IMPRESSION: Possible small focus of hyperdensity at the left aspect of the pons, which could represent hemorrhage, calcification, or laminar necrosis. Attention on subsequent CTA and MRI. These findings were discussed on 09/28/2022 at 11:54 pm with provider ARORA. Electronically Signed   By: Wiliam Ke M.D.   On: 09/28/2022 23:54    Anti-infectives (From admission, onward)    None       Assessment/Plan Ileus vs SBO - HDS. Afebrile. No tachycardia or hypotension. WBC wnl. No peritonitis on exam. No current indication for emergency surgery - Place NGT for decompression and keep NPO - Once NGT placement is confirmed will obtain CT A/P with PO contrast via the NGT (no IV contrast, AKI on today's labs).  - Keep K > 4 and Mg > 2 for bowel function - We will follow with you.  I reviewed nursing notes, hospitalist notes, last 24 h vitals and pain scores, last 48 h intake and output, last 24 h labs and trends, and last 24  h imaging results.  Jacinto Halim, [facility] Surgery 09/30/2022, 3:47 PM Please see Amion for pager number during day hours 7:00am-4:30pm

## 2022-09-30 NOTE — Progress Notes (Signed)
Nutrition Brief Note  Cortrak placed today. Xray confirms tip in distal stomach/proximal duodenum. However after placement, MD ordered abdominal xray with findings of decompressed colon. Concerning for SBO versus ileus.   Pending General Surgery evaluation. MD/Surgery recommend removing cortrak and placing NGT to LIS.   If unable to advance diet or initiate tube feeding, recommend TPN for nutrition support.    Will continue to follow up as appropriate. Please consult if additional nutrition related concerns arise in the meantime.   Drusilla Kanner, RDN, LDN Clinical Nutrition

## 2022-09-30 NOTE — Progress Notes (Signed)
[facility]  PROGRESS NOTE  Brenda Riggs ZOX:[id number] DOB: [birth year] DOA: 09/28/2022 PCP: Brenda Plum, MD   Brief HPI:   67 year old female with medical history of asthma, diabetes mellitus type 2, hypertension, hyperlipidemia, ICH with residual dysarthria presented for worsening weakness and worsening speech difficulty.  In the ED she was afebrile, found to have A-fib with RVR with heart rate 151.  MRI brain showed limited study but negative for acute intracranial abnormality.  Neurology was consulted, felt likely recrudescence of old stroke symptoms in setting of underlying systemic infection.  Possible small stroke in setting of A-fib.  No anticoagulation recommended due to extremely high risk of hemorrhage in the brain because of presence of chronic microhemorrhages and prior ICH as per neurology.  Also had dysphagia.  Swallow evaluation obtained.  Patient failed swallow evaluation, currently NPO.      Assessment/Plan:   New onset atrial fibrillation -Started on IV Cardizem gtt. -Heart rate is controlled -No anticoagulation started due to increased risk of intracranial hemorrhage in the brain -Echocardiogram shows EF 65 to 70%, no regional wall motion abnormalities, left ventricular diastolic parameters are indeterminate -TSH 1.044 -Patient to p.o. Cardizem once patient tube feeding starts   Dysphagia -Patient failed swallow evaluation, formal swallow evaluation obtained -Patient made n.p.o. due to high risk of aspiration -Core track feeding tube placed today -Chest x-ray showed gaseous distention, abdominal x-ray ordered -Will need discussion for PEG tube placement if her swallowing  does not improve in the next few days   History of stroke -Presented with left side weakness; worsening aphasia -Neurology saw patient, felt a recrudescence of old stroke symptoms versus new stroke -MRI brain was unremarkable for acute abnormality   Hypomagnesemia -Repeat    Vomiting -Patient had multiple episodes of small amount of emesis -Denies abdominal pain -Started IV Protonix -Follow-up abdominal x-ray today -Currently n.p.o., start tube feedings if abdominal x-ray rules out obstruction or ileus   Diabetes mellitus type 2 -Continue sliding scale insulin with NovoLog   OSA -Continue nightly CPAP   Leukocytosis -WBC improved from 18.3-5.7 -Not on antibiotics -Continue to monitor   Acute kidney injury -Patient's BUNs/creatinine 24/1.21 -Start gentle IV hydration with LR at 50 mill per hour -Follow renal function in a.m.   Medications     enoxaparin (LOVENOX) injection  40 mg Subcutaneous Daily   insulin aspart  0-6 Units Subcutaneous Q4H   pantoprazole (PROTONIX) IV  40 mg Intravenous Q24H   sodium chloride flush  3 mL Intravenous Q12H   thiamine (VITAMIN B1) injection  100 mg Intravenous Daily     Data Reviewed:   CBG:  Recent Labs  Lab 09/29/22 1116 09/29/22 1711 09/29/22 2018 09/30/22 0010 09/30/22 0421  GLUCAP 126* 128* 134* 145* 124*    SpO2: 100 %    Vitals:   09/30/22 0010 09/30/22 0400 09/30/22 0700 09/30/22 0727  BP: (!) 120/53 (!) 123/59 (!) 132/52   Pulse: 78 77 72   Resp: 20 15 15 16   Temp: 98.4 F (36.9 C) 98.5 F (36.9 C)    TempSrc: Oral Oral    SpO2: 97% 92% (!) 87% 100%  Weight:  50.1 kg    Height:          Data Reviewed:  Basic Metabolic Panel: Recent Labs  Lab 09/28/22 2337 09/28/22 2342 09/29/22 0550 09/30/22 0037  NA 141 141 136 137  K 2.8* 2.8* 4.2 4.8  CL 98 98 96* 97*  CO2 28  --  27 29  GLUCOSE 165* 166* 155* 139*  BUN 15 16 12  24*  CREATININE 0.56 0.40* 0.62 1.21*  CALCIUM 9.9  --  9.7 9.2  MG  --   --  1.6* 2.4    CBC: Recent Labs  Lab 09/28/22 2337 09/28/22 2342 09/29/22 0550 09/30/22 0037  WBC 18.3*  --  13.8* 5.7  NEUTROABS 15.3*  --   --   --   HGB 12.5 13.6 13.0 11.0*  HCT 38.4 40.0 38.9 33.1*  MCV 96.7  --  90.0 90.4  PLT 259  --  357 349     LFT Recent Labs  Lab 09/28/22 2337 09/30/22 0037  AST 28 25  ALT 23 19  ALKPHOS 71 65  BILITOT 0.6 0.7  PROT 7.7 7.0  ALBUMIN 3.8 3.3*     Antibiotics: Anti-infectives (From admission, onward)    None        DVT prophylaxis: Lovenox  Code Status: Full code  Family Communication: Discussed with patient's sister at bedside   CONSULTS    Subjective   Looks more alert this morning, tries to communicate.  Dobbhoff tube placed this morning.   Objective    Physical Examination:   General: Appears in no acute distress Cardiovascular: S1-S2, regular, no murmur auscultated Respiratory: Lungs clear to auscultation bilaterally Abdomen: Soft, nontender Extremities: No edema in the lower extremities Neurologic: Alert, responding to questions, nonverbal at baseline   Status is: Inpatient:             Brenda Riggs   [facility] If 7PM-7AM, please contact night-coverage at [email or web address], Office  [phone]   09/30/2022, 8:07 AM  LOS: 1 day

## 2022-09-30 NOTE — Progress Notes (Signed)
Patient had Dobbhoff tube placed today, x-ray of the lobe of tube showed possibility of SBO.  Abdominal x-ray was obtained which showed dilated small bowel loops, colon decompressed.  Findings consistent with ileus versus small bowel obstruction. Unfortunately Dobbhoff tube was placed this morning, will remove core track feeding tube and place NG tube to low intermittent suction. Will consult general surgery for further management.

## 2022-09-30 NOTE — Progress Notes (Signed)
Cortrak removed per order.   NGT insertion unsuccessful. Patient was in distress and O2 saturation was 80% during insertion.   Surgical team at bedside agreeable to have fluoroscopy insert NGT.

## 2022-09-30 NOTE — Procedures (Signed)
Cortrak  Person Inserting Tube:  Solstice Lastinger C, RD Tube Type:  Cortrak - 43 inches Tube Size:  10 Tube Location:  Left nare Secured by: Bridle Technique Used to Measure Tube Placement:  Marking at nare/corner of mouth Cortrak Secured At:  63 cm   Cortrak Tube Team Note:  Consult received to place a Cortrak feeding tube.   X-ray is required, abdominal x-ray has been ordered by the Cortrak team. Please confirm tube placement before using the Cortrak tube.   If the tube becomes dislodged please keep the tube and contact the Cortrak team at [email or web address] for replacement.  If after hours and replacement cannot be delayed, place a NG tube and confirm placement with an abdominal x-ray.   Kinzie Wickes P., RD, LDN, CNSC See AMiON for contact information    

## 2022-09-30 NOTE — Progress Notes (Addendum)
Speech Language Pathology Treatment: Dysphagia  Patient Details Name: Brenda Riggs MRN: [id number] DOB: November 04, 1955 Today's Date: 09/30/2022 Time: 4098-1191 SLP Time Calculation (min) (ACUTE ONLY): 20 min  Assessment / Plan / Recommendation Clinical Impression  Patient seen for po readiness/diagnostic po trials. Patient sleeping upon arrival to room but easily aroused. Oral care provided to increase safety with po trials. SLP provided ice chips at bedside and patient with continued rhythmic pulsing of her tongue (not coinciding with heart beat) impacting her ability to orally control bolus. Pharyngeal swallow initiated in 1/5 trials. Secretions audible in upper airway. She continues to be inappropriate for pos. Discussed with daughter via phone. Daughter reported that patient was significantly dysarthric prior to admission but does not endorse clinician described lingual movement. She reports that despite dysarthria and with extra time, patient typically consumed 80-90% of diet per meal prior to admission which was pureed solids and thin liquids. Current presentation therefore is a significant change from baseline despite baseline deficits. SLP will continue to f/u for po readiness.   SLP has sent secure chat to MD requesting that he assess lingual movements for possible new diagnosis that may be contributing as this does appear to be significantly impacting function.    HPI HPI: 67 year old with prior history of ICH and poor baseline with dysarthria and weakness worse on the left than right at baseline presenting for evaluation of worsening slurred speech and worsening weakness.  MRI which does not show any evidence of an acute stroke.  Per MD, Has a leukocytosis-wonder if this is recrudescence of old symptoms in the setting of an underlying infection or recrudescence of stroke symptoms in the setting of acute illness-new onset A-fib. Most recent MBS complete 08/20/20 indicated a  severe oral and mild  pharyngeal dysphagia mostly c/b decreased oral control from significant weakness, with recommendations for dysphagia 1 with thin liquid.  MBs also in 2021 with recommendations for dys 3, thin liquid.      SLP Plan  Continue with current plan of care      Recommendations for follow up therapy are one component of a multi-disciplinary discharge planning process, led by the attending physician.  Recommendations may be updated based on patient status, additional functional criteria and insurance authorization.    Recommendations  Diet recommendations: NPO Medication Administration: Via alternative means                  Oral care QID     Dysphagia, oropharyngeal phase (R13.12)     Continue with current plan of care    [facility] MA, CCC-SLP  Kevia Zaucha Meryl  09/30/2022, 10:11 AM

## 2022-10-01 ENCOUNTER — Inpatient Hospital Stay (HOSPITAL_COMMUNITY): Payer: Medicare Other

## 2022-10-01 DIAGNOSIS — J4489 Other specified chronic obstructive pulmonary disease: Secondary | ICD-10-CM | POA: Diagnosis not present

## 2022-10-01 DIAGNOSIS — G4733 Obstructive sleep apnea (adult) (pediatric): Secondary | ICD-10-CM | POA: Diagnosis not present

## 2022-10-01 DIAGNOSIS — Z8249 Family history of ischemic heart disease and other diseases of the circulatory system: Secondary | ICD-10-CM | POA: Diagnosis not present

## 2022-10-01 DIAGNOSIS — E785 Hyperlipidemia, unspecified: Secondary | ICD-10-CM | POA: Diagnosis not present

## 2022-10-01 DIAGNOSIS — E440 Moderate protein-calorie malnutrition: Secondary | ICD-10-CM | POA: Diagnosis not present

## 2022-10-01 DIAGNOSIS — I4891 Unspecified atrial fibrillation: Secondary | ICD-10-CM | POA: Diagnosis not present

## 2022-10-01 DIAGNOSIS — Z6823 Body mass index (BMI) 23.0-23.9, adult: Secondary | ICD-10-CM | POA: Diagnosis not present

## 2022-10-01 DIAGNOSIS — E876 Hypokalemia: Secondary | ICD-10-CM | POA: Diagnosis not present

## 2022-10-01 DIAGNOSIS — Z7984 Long term (current) use of oral hypoglycemic drugs: Secondary | ICD-10-CM | POA: Diagnosis not present

## 2022-10-01 DIAGNOSIS — R627 Adult failure to thrive: Secondary | ICD-10-CM | POA: Diagnosis not present

## 2022-10-01 DIAGNOSIS — N179 Acute kidney failure, unspecified: Secondary | ICD-10-CM | POA: Diagnosis not present

## 2022-10-01 DIAGNOSIS — I10 Essential (primary) hypertension: Secondary | ICD-10-CM | POA: Diagnosis not present

## 2022-10-01 DIAGNOSIS — Z66 Do not resuscitate: Secondary | ICD-10-CM | POA: Diagnosis not present

## 2022-10-01 DIAGNOSIS — I480 Paroxysmal atrial fibrillation: Secondary | ICD-10-CM | POA: Diagnosis not present

## 2022-10-01 DIAGNOSIS — D509 Iron deficiency anemia, unspecified: Secondary | ICD-10-CM | POA: Diagnosis not present

## 2022-10-01 DIAGNOSIS — I69354 Hemiplegia and hemiparesis following cerebral infarction affecting left non-dominant side: Secondary | ICD-10-CM | POA: Diagnosis not present

## 2022-10-01 DIAGNOSIS — R4701 Aphasia: Secondary | ICD-10-CM | POA: Diagnosis not present

## 2022-10-01 DIAGNOSIS — F0390 Unspecified dementia without behavioral disturbance: Secondary | ICD-10-CM | POA: Diagnosis not present

## 2022-10-01 DIAGNOSIS — K567 Ileus, unspecified: Secondary | ICD-10-CM | POA: Diagnosis not present

## 2022-10-01 DIAGNOSIS — E119 Type 2 diabetes mellitus without complications: Secondary | ICD-10-CM | POA: Diagnosis not present

## 2022-10-01 DIAGNOSIS — F209 Schizophrenia, unspecified: Secondary | ICD-10-CM | POA: Diagnosis not present

## 2022-10-01 DIAGNOSIS — R1311 Dysphagia, oral phase: Secondary | ICD-10-CM | POA: Diagnosis not present

## 2022-10-01 DIAGNOSIS — F17210 Nicotine dependence, cigarettes, uncomplicated: Secondary | ICD-10-CM | POA: Diagnosis not present

## 2022-10-01 LAB — GLUCOSE, CAPILLARY
Glucose-Capillary: 114 mg/dL — ABNORMAL HIGH (ref 70–99)
Glucose-Capillary: 76 mg/dL (ref 70–99)
Glucose-Capillary: 90 mg/dL (ref 70–99)
Glucose-Capillary: 95 mg/dL (ref 70–99)
Glucose-Capillary: 95 mg/dL (ref 70–99)
Glucose-Capillary: 99 mg/dL (ref 70–99)

## 2022-10-01 LAB — BASIC METABOLIC PANEL
Anion gap: 9 (ref 5–15)
BUN: 26 mg/dL — ABNORMAL HIGH (ref 8–23)
CO2: 29 mmol/L (ref 22–32)
Calcium: 8.8 mg/dL — ABNORMAL LOW (ref 8.9–10.3)
Chloride: 98 mmol/L (ref 98–111)
Creatinine, Ser: 0.74 mg/dL (ref 0.44–1.00)
GFR, Estimated: >60 mL/min (ref >60)
Glucose, Bld: 105 mg/dL — ABNORMAL HIGH (ref 70–99)
Potassium: 3.7 mmol/L (ref 3.5–5.1)
Sodium: 136 mmol/L (ref 135–145)

## 2022-10-01 LAB — IRON AND TIBC
Iron: 14 ug/dL — ABNORMAL LOW (ref 28–170)
Saturation Ratios: 3 % — ABNORMAL LOW (ref 10.4–31.8)
TIBC: 430 ug/dL (ref 250–450)
UIBC: 416 ug/dL

## 2022-10-01 LAB — CBC
HCT: 31.6 % — ABNORMAL LOW (ref 36.0–46.0)
Hemoglobin: 10.4 g/dL — ABNORMAL LOW (ref 12.0–15.0)
MCH: 30.4 pg (ref 26.0–34.0)
MCHC: 32.9 g/dL (ref 30.0–36.0)
MCV: 92.4 fL (ref 80.0–100.0)
Platelets: 296 10*3/uL (ref 150–400)
RBC: 3.42 MIL/uL — ABNORMAL LOW (ref 3.87–5.11)
RDW: 14.3 % (ref 11.5–15.5)
WBC: 6 10*3/uL (ref 4.0–10.5)
nRBC: 0 % (ref 0.0–0.2)

## 2022-10-01 LAB — FOLATE: Folate: 14.6 ng/mL (ref >5.9)

## 2022-10-01 LAB — VITAMIN D 25 HYDROXY (VIT D DEFICIENCY, FRACTURES): Vit D, 25-Hydroxy: 44.27 ng/mL (ref 30–100)

## 2022-10-01 LAB — PHOSPHORUS: Phosphorus: 4.4 mg/dL (ref 2.5–4.6)

## 2022-10-01 LAB — VITAMIN B12: Vitamin B-12: 2059 pg/mL — ABNORMAL HIGH (ref 180–914)

## 2022-10-01 LAB — MAGNESIUM: Magnesium: 2.3 mg/dL (ref 1.7–2.4)

## 2022-10-01 MED ORDER — KCL IN DEXTROSE-NACL 20-5-0.9 MEQ/L-%-% IV SOLN
INTRAVENOUS | Status: DC
  Filled 2022-10-01 (×11): qty 1000

## 2022-10-01 MED ORDER — DIATRIZOATE MEGLUMINE & SODIUM 66-10 % PO SOLN
90.0000 mL | Freq: Once | ORAL | Status: AC
  Administered 2022-10-01: 90 mL via NASOGASTRIC
  Filled 2022-10-01: qty 90

## 2022-10-01 NOTE — Progress Notes (Signed)
Received call from MD [facility] regarding patient cardizem drip being stopped  MD advised that it was okay to stop the drip

## 2022-10-01 NOTE — Progress Notes (Signed)
[facility] Progress Note  Patient: Brenda Riggs    EAV:[id number]  DOA: 09/28/2022     Date of Service: the patient was seen and examined on 10/01/2022  No chief complaint on file.  Brief hospital course: 67 year old female with medical history of asthma, diabetes mellitus type 2, hypertension, hyperlipidemia, ICH with residual dysarthria presented for worsening weakness and worsening speech difficulty. In the ED she was afebrile, found to have A-fib with RVR with heart rate 151. MRI brain showed limited study but negative for acute intracranial abnormality. Neurology was consulted, felt likely recrudescence of old stroke symptoms in setting of underlying systemic infection. Possible small stroke in setting of A-fib. No anticoagulation recommended due to extremely high risk of hemorrhage in the brain because of presence of chronic microhemorrhages and prior ICH as per neurology. Also had dysphagia. Swallow evaluation obtained. Patient failed swallow evaluation, currently NPO.    Assessment and Plan:  # Ileus versus SBO Patient presented with vomiting NG tube was placed, continue low intermittent suction, discussed with RN to clamp if no output Continue PPI for GI prophylaxis General surgery consulted, recommended conservative management Keep n.p.o. for now Continue IV fluid for hydration Intermittent suction patient has difficulty swallowing   # New onset paroxysmal atrial fibrillation, converted back to normal sinus rhythm S/p IV Cardizem gtt, d/c;d on 6/1 -Heart rate is controlled -No anticoagulation started due to increased risk of intracranial hemorrhage in the brain -Echocardiogram shows EF 65 to 70%, no regional wall motion abnormalities, left ventricular diastolic parameters are indeterminate -TSH 1.044 TTE LVEF 65 to 70%, moderate LV hypertrophy.  No significant valvular abnormality. Considered cardiology consult if persistent A-fib  # Dysphagia -Patient failed swallow  evaluation F/u SLP eval after resolution of SBO/ileus Continue intermittent suction to prevent aspiration Continue aspiration precautions Patient may need PEG tube down the road, we will discuss after SLP eval   # History of stroke -Presented with left side weakness; worsening aphasia -Neurology saw patient, felt a recrudescence of old stroke symptoms versus new stroke -MRI brain was unremarkable for acute abnormality   # Hypomagnesemia -Repeat     # Diabetes mellitus type 2 -Continue sliding scale insulin with NovoLog   # OSA -Continue nightly CPAP   # Leukocytosis -WBC improved from 18.3-5.7 -Not on antibiotics -Continue to monitor   # Acute kidney injury -Patient's BUNs/creatinine 24/1.21 -Start gentle IV hydration with LR at 50 mill per hour -Follow renal function in a.m.   Body mass index is 23.37 kg/m.  Nutrition Problem: Moderate Malnutrition Etiology: chronic illness Interventions: Interventions: Tube feeding, Refer to RD note for recommendations   Pressure Injury 03/22/20 Coccyx Stage 2 -  Partial thickness loss of dermis presenting as a shallow open injury with a red, pink wound bed without slough. (Active)  03/22/20 1200  Location: Coccyx  Location Orientation:   Staging: Stage 2 -  Partial thickness loss of dermis presenting as a shallow open injury with a red, pink wound bed without slough.  Wound Description (Comments):   Present on Admission: Yes     Pressure Injury 08/16/20 Ischial tuberosity Left Stage 4 - Full thickness tissue loss with exposed bone, tendon or muscle. foul odor round stage 4 with yellow slough (Active)  08/16/20 0100  Location: Ischial tuberosity  Location Orientation: Left  Staging: Stage 4 - Full thickness tissue loss with exposed bone, tendon or muscle.  Wound Description (Comments): foul odor round stage 4 with yellow slough  Present on Admission: Yes  Diet: N.p.o. DVT Prophylaxis: Subcutaneous Lovenox   Advance  goals of care discussion: Full code  Family Communication: family was not present at bedside, at the time of interview.  The pt provided permission to discuss medical plan with the family. Opportunity was given to ask question and all questions were answered satisfactorily.   Disposition:  Pt is from SNF, admitted with worsening of left-sided weakness and speech difficulty, found to have SBO/ileus and new onset A-fib with RVR, s/p Cardizem IV infusion, currently has NG tube with intermittent suction and still n.p.o., which precludes a safe discharge. Discharge to SNF, when clinically stable, may need few days to improve..  Subjective: No significant events overnight, patient was having difficulty speech and swallowing, holding secretions in the mouth.  Denies any abdominal pain, no chest pain or palpitation, no shortness of breath.  Physical Exam: General: NAD, lying comfortably Appear in no distress, affect appropriate Eyes: PERRLA ENT: Oral Mucosa Clear, moist  Neck: no JVD,  Cardiovascular: S1 and S2 Present, no Murmur,  Respiratory: good respiratory effort, Bilateral Air entry equal and Decreased, no Crackles, no wheezes Abdomen: Bowel Sound present, Soft and no tenderness,  Skin: no rashes Extremities: no Pedal edema, no calf tenderness Neurologic: Aphasia and left-sided residual weakness due to prior stroke, no new deficits Gait not checked due to patient safety concerns  Vitals:   10/01/22 1133 10/01/22 1200 10/01/22 1300 10/01/22 1400  BP: 139/62 132/62 (!) 127/58 (!) 144/43  Pulse: 83 80 83 87  Resp: 13 (!) 22 (!) 22 17  Temp: 97.7 F (36.5 C)     TempSrc: Oral     SpO2: 100% 97% 97% 100%  Weight:      Height:        Intake/Output Summary (Last 24 hours) at 10/01/2022 1442 Last data filed at 09/30/2022 1743 Gross per 24 hour  Intake 94.15 ml  Output --  Net 94.15 ml   Filed Weights   09/29/22 0414 09/30/22 0400 10/01/22 0400  Weight: 50.1 kg 50.1 kg 49 kg     Data Reviewed: I have personally reviewed and interpreted daily labs, tele strips, imagings as discussed above. I reviewed all nursing notes, pharmacy notes, vitals, pertinent old records I have discussed plan of care as described above with RN and patient/family.  CBC: Recent Labs  Lab 09/28/22 2337 09/28/22 2342 09/29/22 0550 09/30/22 0037 10/01/22 0044  WBC 18.3*  --  13.8* 5.7 6.0  NEUTROABS 15.3*  --   --   --   --   HGB 12.5 13.6 13.0 11.0* 10.4*  HCT 38.4 40.0 38.9 33.1* 31.6*  MCV 96.7  --  90.0 90.4 92.4  PLT 259  --  357 349 296   Basic Metabolic Panel: Recent Labs  Lab 09/28/22 2337 09/28/22 2342 09/29/22 0550 09/30/22 0037 10/01/22 0043 10/01/22 0044  NA 141 141 136 137  --  136  K 2.8* 2.8* 4.2 4.8  --  3.7  CL 98 98 96* 97*  --  98  CO2 28  --  27 29  --  29  GLUCOSE 165* 166* 155* 139*  --  105*  BUN 15 16 12  24*  --  26*  CREATININE 0.56 0.40* 0.62 1.21*  --  0.74  CALCIUM 9.9  --  9.7 9.2  --  8.8*  MG  --   --  1.6* 2.4 2.3  --   PHOS  --   --   --   --  4.4  --  Studies: DG Abd 1 View  Result Date: 10/01/2022 CLINICAL DATA:  Nasogastric tube placement. EXAM: ABDOMEN - 1 VIEW COMPARISON:  09/30/2022. FINDINGS: Nasogastric tube passes well below the diaphragm, curling in the distal stomach, tip projecting in the fundus. Dilated small bowel is less prominent than on the previous day's study. IMPRESSION: 1. Well-positioned nasal/orogastric tube. Electronically Signed   By: Amie Portland M.D.   On: 10/01/2022 13:01    Scheduled Meds:  diatrizoate meglumine-sodium  90 mL Per NG tube Once   enoxaparin (LOVENOX) injection  40 mg Subcutaneous Daily   insulin aspart  0-6 Units Subcutaneous Q4H   mouth rinse  15 mL Mouth Rinse 4 times per day   pantoprazole (PROTONIX) IV  40 mg Intravenous Q24H   sodium chloride flush  3 mL Intravenous Q12H   thiamine (VITAMIN B1) injection  100 mg Intravenous Daily   Continuous Infusions:  diltiazem (CARDIZEM)  infusion Stopped (10/01/22 0630)   PRN Meds: acetaminophen **OR** acetaminophen, ondansetron **OR** ondansetron (ZOFRAN) IV, mouth rinse, mouth rinse  Time spent: 35 minutes  Author: Gillis Santa. MD [facility] 10/01/2022 2:42 PM  To reach On-call, see care teams to locate the attending and reach out to them via [email or web address]. If 7PM-7AM, please contact night-coverage If you still have difficulty reaching the attending provider, please page the [facility] (Director on Call) for [facility] on amion for assistance.

## 2022-10-01 NOTE — Progress Notes (Signed)
SLP Cancellation Note  Patient Details Name: Brenda Riggs MRN: [id number] DOB: 10-Jul-1955   Cancelled treatment:       Reason Eval/Treat Not Completed: Other (comment) (Patient to be kept NPO today as per surgery. SLP will f/u next date for readiness.)  Angela Nevin, MA, CCC-SLP Speech Therapy

## 2022-10-01 NOTE — Progress Notes (Signed)
MD Joneen Roach and MD Lucianne Muss has been sent secure chat and MD Joneen Roach has been paged regarding stopping the Cardizem drip as per orders  Pt bp 123/77 Map (92) HR was 65 but has decreased to 58  Pt is sleeping at this time Easily aroused  No s/s or c/o pain or discomfort No n/v/d to note No s/s or c/o sob/dyspnea to note Callbell within reach Bed in proper position for safety  Bed alarm in use

## 2022-10-01 NOTE — Progress Notes (Signed)
Subjective/Chief Complaint: Resting comfortably.  Denies nausea or vomiting.  Reports she has been passing flatus.  Denies abdominal pain   Objective: Vital signs in last 24 hours: Temp:  [97.6 F (36.4 C)-98.6 F (37 C)] 98.5 F (36.9 C) (06/01 0400) Pulse Rate:  [65-79] 65 (06/01 0615) Resp:  [11-19] 15 (06/01 0615) BP: (107-156)/(52-77) 123/77 (06/01 0615) SpO2:  [87 %-100 %] 100 % (06/01 0400) Weight:  [49 kg] 49 kg (06/01 0400) Last BM Date : 09/29/22  Intake/Output from previous day: 05/31 0701 - 06/01 0700 In: 185.1 [I.V.:185.1] Out: 250 [Urine:250] Intake/Output this shift: No intake/output data recorded.  Alert, calm, cooperative Unlabored respirations Abdomen is soft, nontender, minimally distended  Lab Results:  Recent Labs    09/30/22 0037 10/01/22 0044  WBC 5.7 6.0  HGB 11.0* 10.4*  HCT 33.1* 31.6*  PLT 349 296   BMET Recent Labs    09/30/22 0037 10/01/22 0044  NA 137 136  K 4.8 3.7  CL 97* 98  CO2 29 29  GLUCOSE 139* 105*  BUN 24* 26*  CREATININE 1.21* 0.74  CALCIUM 9.2 8.8*   PT/INR Recent Labs    09/28/22 2337  LABPROT 12.9  INR 1.0   ABG No results for input(s): "PHART", "HCO3" in the last 72 hours.  Invalid input(s): "PCO2", "PO2"  Studies/Results: DG Abd 1 View  Result Date: 09/30/2022 CLINICAL DATA:  [id number] Vomiting [id number] EXAM: ABDOMEN - 1 VIEW COMPARISON:  Radiograph 08/15/2020 FINDINGS: Feeding tube tip overlies the distal stomach/proximal duodenum. There are multiple dilated loops of small bowel in the abdomen. The colon appears decompressed. IMPRESSION: Multiple dilated loops of small bowel with decompressed colon suggestive of small-bowel obstruction. Ileus is possible. Electronically Signed   By: Caprice Renshaw M.D.   On: 09/30/2022 13:11   DG Abd Portable 1V  Result Date: 09/30/2022 CLINICAL DATA:  Provided history: Encounter for feeding tube placement. EXAM: PORTABLE ABDOMEN - 1 VIEW COMPARISON:  Chest radiograph  09/29/2022. Abdominal radiograph 08/15/2020. FINDINGS: An enteric tube terminates in the expected location of the distal stomach/proximal duodenum. Nonspecific gaseous distension of bowel within the central abdomen at the imaged levels. No appreciable airspace consolidation within the imaged lungs. The cardiomediastinal silhouette is unchanged. Aortic atherosclerosis. IMPRESSION: 1. The enteric tube terminates in the expected location of the distal stomach/proximal duodenum. 2. Nonspecific gaseous distension of bowel within the central abdomen at the imaged levels. Consider abdominal radiographs (to include the entirety of the abdomen) for further evaluation, as clinically warranted. 3.  Aortic Atherosclerosis (ICD10-I70.0). Electronically Signed   By: Jackey Loge D.O.   On: 09/30/2022 09:58   ECHOCARDIOGRAM COMPLETE  Result Date: 09/29/2022    ECHOCARDIOGRAM REPORT   Patient Name:   Brenda Riggs Date of Exam: 09/29/2022 Medical Rec #:  [id number]      Height:       57.0 in Accession #:    [id number]     Weight:       110.4 lb Date of Birth:  Jul 23, 1955       BSA:          1.398 m Patient Age:    66 years       BP:           137/91 mmHg Patient Gender: F              HR:           80 bpm. Exam Location:  Inpatient Procedure: 2D Echo, Cardiac Doppler,  Color Doppler and Intracardiac            Opacification Agent Indications:    Atrial Fibrillation I48.91  History:        Patient has prior history of Echocardiogram examinations, most                 recent 03/22/2020. Stroke, Arrythmias:Atrial Fibrillation; Risk                 Factors:Hypertension, Sleep Apnea, Diabetes, Dyslipidemia and                 Current Smoker.  Sonographer:    Lucendia Herrlich Referring Phys: [id number] TIMOTHY S OPYD IMPRESSIONS  1. Left ventricular ejection fraction, by estimation, is 65 to 70%. The left ventricle has normal function. The left ventricle has no regional wall motion abnormalities. There is moderate concentric left  ventricular hypertrophy. Left ventricular diastolic parameters are indeterminate.  2. Right ventricular systolic function is normal. The right ventricular size is normal.  3. The mitral valve is normal in structure. Trivial mitral valve regurgitation.  4. The aortic valve is tricuspid. Aortic valve regurgitation is not visualized. Aortic valve sclerosis is present, with no evidence of aortic valve stenosis.  5. The inferior vena cava is normal in size with greater than 50% respiratory variability, suggesting right atrial pressure of 3 mmHg. Comparison(s): No significant change from prior study. FINDINGS  Left Ventricle: Left ventricular ejection fraction, by estimation, is 65 to 70%. The left ventricle has normal function. The left ventricle has no regional wall motion abnormalities. Definity contrast agent was given IV to delineate the left ventricular  endocardial borders. The left ventricular internal cavity size was normal in size. There is moderate concentric left ventricular hypertrophy. Left ventricular diastolic parameters are indeterminate. Right Ventricle: The right ventricular size is normal. Right vetricular wall thickness was not well visualized. Right ventricular systolic function is normal. Left Atrium: Left atrial size was normal in size. Right Atrium: Right atrial size was normal in size. Pericardium: There is no evidence of pericardial effusion. Mitral Valve: The mitral valve is normal in structure. Trivial mitral valve regurgitation. Tricuspid Valve: The tricuspid valve is normal in structure. Tricuspid valve regurgitation is trivial. Aortic Valve: The aortic valve is tricuspid. Aortic valve regurgitation is not visualized. Aortic valve sclerosis is present, with no evidence of aortic valve stenosis. Aortic valve peak gradient measures 7.8 mmHg. Pulmonic Valve: The pulmonic valve was normal in structure. Pulmonic valve regurgitation is trivial. Aorta: The aortic root is normal in size and  structure. Venous: The inferior vena cava is normal in size with greater than 50% respiratory variability, suggesting right atrial pressure of 3 mmHg. IAS/Shunts: The atrial septum is grossly normal.  LEFT VENTRICLE PLAX 2D LVIDd:         3.40 cm   Diastology LVIDs:         2.25 cm   LV e' medial:    7.15 cm/s LV PW:         1.15 cm   LV E/e' medial:  7.8 LV IVS:        1.20 cm   LV e' lateral:   10.20 cm/s LVOT diam:     1.90 cm   LV E/e' lateral: 5.5 LV SV:         43 LV SV Index:   31 LVOT Area:     2.84 cm  RIGHT VENTRICLE  IVC RV S prime:     13.20 cm/s  IVC diam: 1.20 cm LEFT ATRIUM           Index        RIGHT ATRIUM          Index LA diam:      2.95 cm 2.11 cm/m   RA Area:     9.09 cm LA Vol (A2C): 22.6 ml 16.17 ml/m  RA Volume:   13.30 ml 9.52 ml/m LA Vol (A4C): 24.9 ml 17.82 ml/m  AORTIC VALVE AV Area (Vmax): 1.95 cm AV Vmax:        140.00 cm/s AV Peak Grad:   7.8 mmHg LVOT Vmax:      96.50 cm/s LVOT Vmean:     61.200 cm/s LVOT VTI:       0.152 m  AORTA Ao Root diam: 2.80 cm Ao Asc diam:  3.00 cm MITRAL VALVE               TRICUSPID VALVE MV Area (PHT): 3.65 cm    TR Peak grad:   13.2 mmHg MV Decel Time: 208 msec    TR Vmax:        182.00 cm/s MV E velocity: 55.70 cm/s MV A velocity: 75.90 cm/s  SHUNTS MV E/A ratio:  0.73        Systemic VTI:  0.15 m                            Systemic Diam: 1.90 cm Laurance Flatten MD Electronically signed by Laurance Flatten MD Signature Date/Time: 09/29/2022/11:50:01 AM    Final     Anti-infectives: Anti-infectives (From admission, onward)    None       Assessment/Plan: Ileus vs SBO - HDS. Afebrile. No tachycardia or hypotension. WBC wnl. No peritonitis on exam. No current indication for emergency surgery - Place NGT for decompression and keep NPO-this was unsuccessful last night, plan seems to be to do this under fluoroscopy.  If patient is not having nausea or vomiting and continues to pass flatus, may be reasonable to try p.o. trial  and hold off on NG/CT at this point. - CT A/P with PO contrast via the NGT (no IV contrast, AKI on today's labs).  This has been ordered. - Keep K > 4 and Mg > 2 for bowel function - We will follow with you.   I reviewed nursing notes, hospitalist notes, last 24 h vitals and pain scores, last 48 h intake and output, last 24 h labs and trends, and last 24 h imaging results.    LOS: 2 days    Berna Bue 10/01/2022 Mdm-moderate

## 2022-10-01 NOTE — Progress Notes (Signed)
   09/30/22 2318  BiPAP/CPAP/SIPAP  Reason BIPAP/CPAP not in use Other(comment) (pt refused. pt states does not wear cpap at home.)

## 2022-10-02 ENCOUNTER — Inpatient Hospital Stay (HOSPITAL_COMMUNITY): Payer: Medicare Other

## 2022-10-02 ENCOUNTER — Other Ambulatory Visit: Payer: Self-pay

## 2022-10-02 DIAGNOSIS — I480 Paroxysmal atrial fibrillation: Secondary | ICD-10-CM | POA: Diagnosis not present

## 2022-10-02 DIAGNOSIS — I4891 Unspecified atrial fibrillation: Secondary | ICD-10-CM | POA: Diagnosis not present

## 2022-10-02 LAB — MAGNESIUM
Magnesium: 2.1 mg/dL (ref 1.7–2.4)
Magnesium: 2.1 mg/dL (ref 1.7–2.4)

## 2022-10-02 LAB — BASIC METABOLIC PANEL
Anion gap: 11 (ref 5–15)
BUN: 18 mg/dL (ref 8–23)
CO2: 28 mmol/L (ref 22–32)
Calcium: 8.7 mg/dL — ABNORMAL LOW (ref 8.9–10.3)
Chloride: 101 mmol/L (ref 98–111)
Creatinine, Ser: 0.68 mg/dL (ref 0.44–1.00)
GFR, Estimated: >60 mL/min (ref >60)
Glucose, Bld: 115 mg/dL — ABNORMAL HIGH (ref 70–99)
Potassium: 3.2 mmol/L — ABNORMAL LOW (ref 3.5–5.1)
Sodium: 140 mmol/L (ref 135–145)

## 2022-10-02 LAB — CBC
HCT: 32.3 % — ABNORMAL LOW (ref 36.0–46.0)
Hemoglobin: 10.3 g/dL — ABNORMAL LOW (ref 12.0–15.0)
MCH: 29.7 pg (ref 26.0–34.0)
MCHC: 31.9 g/dL (ref 30.0–36.0)
MCV: 93.1 fL (ref 80.0–100.0)
Platelets: 281 10*3/uL (ref 150–400)
RBC: 3.47 MIL/uL — ABNORMAL LOW (ref 3.87–5.11)
RDW: 14 % (ref 11.5–15.5)
WBC: 7.8 10*3/uL (ref 4.0–10.5)
nRBC: 0 % (ref 0.0–0.2)

## 2022-10-02 LAB — GLUCOSE, CAPILLARY
Glucose-Capillary: 108 mg/dL — ABNORMAL HIGH (ref 70–99)
Glucose-Capillary: 112 mg/dL — ABNORMAL HIGH (ref 70–99)
Glucose-Capillary: 128 mg/dL — ABNORMAL HIGH (ref 70–99)
Glucose-Capillary: 139 mg/dL — ABNORMAL HIGH (ref 70–99)
Glucose-Capillary: 80 mg/dL (ref 70–99)
Glucose-Capillary: 87 mg/dL (ref 70–99)

## 2022-10-02 LAB — PHOSPHORUS
Phosphorus: 3.2 mg/dL (ref 2.5–4.6)
Phosphorus: 2.1 mg/dL — ABNORMAL LOW (ref 2.5–4.6)

## 2022-10-02 MED ORDER — POLYSACCHARIDE IRON COMPLEX 150 MG PO CAPS
150.0000 mg | ORAL_CAPSULE | Freq: Every day | ORAL | Status: DC
  Administered 2022-10-07 – 2022-10-09 (×3): 150 mg via ORAL
  Filled 2022-10-02 (×3): qty 1

## 2022-10-02 MED ORDER — POTASSIUM CHLORIDE CRYS ER 20 MEQ PO TBCR: 40.0000 meq | EXTENDED_RELEASE_TABLET | Freq: Two times a day (BID) | ORAL | Status: DC

## 2022-10-02 MED ORDER — POTASSIUM CHLORIDE 20 MEQ PO PACK
40.0000 meq | PACK | Freq: Once | ORAL | Status: AC
  Administered 2022-10-02: 40 meq
  Filled 2022-10-02: qty 2

## 2022-10-02 MED ORDER — SODIUM CHLORIDE 0.9 % IV SOLN
200.0000 mg | INTRAVENOUS | Status: AC
  Administered 2022-10-02 – 2022-10-06 (×5): 200 mg via INTRAVENOUS
  Filled 2022-10-02 (×3): qty 200
  Filled 2022-10-02 (×2): qty 10

## 2022-10-02 MED ORDER — FREE WATER
110.0000 mL | Status: DC
  Administered 2022-10-02 – 2022-10-10 (×43): 110 mL

## 2022-10-02 MED ORDER — OSMOLITE 1.5 CAL PO LIQD
1000.0000 mL | ORAL | Status: DC
  Administered 2022-10-02 – 2022-10-08 (×8): 1000 mL
  Filled 2022-10-02 (×8): qty 1000

## 2022-10-02 MED ORDER — POTASSIUM CHLORIDE 10 MEQ/100ML IV SOLN
10.0000 meq | INTRAVENOUS | Status: AC
  Administered 2022-10-02 (×2): 10 meq via INTRAVENOUS
  Filled 2022-10-02 (×2): qty 100

## 2022-10-02 MED ORDER — PROSOURCE TF20 ENFIT COMPATIBL EN LIQD
60.0000 mL | Freq: Every day | ENTERAL | Status: DC
  Administered 2022-10-02 – 2022-10-09 (×7): 60 mL
  Filled 2022-10-02 (×8): qty 60

## 2022-10-02 NOTE — Evaluation (Signed)
Physical Therapy Evaluation Patient Details Name: Brenda Riggs MRN: [id number] DOB: 11-Aug-1955 Today's Date: 10/02/2022  History of Present Illness  Pt is a 67 y/o F presenting to ED from [facility] with worsening weakness and speech difficulty. MRI brain negative for acute intracranial abnormaility. Neuro consulted and felt recredescence of old stroke symptoms in the setting of underlying systemic infection. Admitted for A fib with RVR. PMH includes asthma, DM2, HTN, HLD, ICH with residual dysarthria  Clinical Impression  Pt admitted with above diagnosis. Pt very limited in mobility with overall assist at max to total assist of 2 persons.  Pt states she scooted to wheelchair PTA at facility.  Pt unable to assist much today with use of pad to scoot to drop arm recliner.  Pt with bil knee flexion contractures and minimal use of UEs as well. Will give trial of PT to determine if pt has potential however pt may need to be hoyer lifted/ Maximove for safety if she continues to require total assist of 2 for transfers and may not be appropriate for PT.  Will follow acutely and progress pt as able.  Pt currently with functional limitations due to the deficits listed below (see PT Problem List). Pt will benefit from acute skilled PT to increase their independence and safety with mobility to allow discharge.          Recommendations for follow up therapy are one component of a multi-disciplinary discharge planning process, led by the attending physician.  Recommendations may be updated based on patient status, additional functional criteria and insurance authorization.  Follow Up Recommendations Can patient physically be transported by private vehicle: No     Assistance Recommended at Discharge Frequent or constant Supervision/Assistance  Patient can return home with the following  Two people to help with walking and/or transfers;Two people to help with bathing/dressing/bathroom;Assistance with  cooking/housework;Assistance with feeding;Direct supervision/assist for medications management;Direct supervision/assist for financial management;Assist for transportation;Help with stairs or ramp for entrance    Equipment Recommendations None recommended by PT  Recommendations for Other Services       Functional Status Assessment Patient has had a recent decline in their functional status and demonstrates the ability to make significant improvements in function in a reasonable and predictable amount of time.     Precautions / Restrictions Precautions Precautions: Fall Precaution Comments: NG tube Restrictions Weight Bearing Restrictions: No      Mobility  Bed Mobility Overal bed mobility: Needs Assistance Bed Mobility: Sidelying to Sit, Sit to Sidelying, Rolling Rolling: Min assist, +2 for physical assistance Sidelying to sit: +2 for physical assistance, Mod assist     Sit to sidelying: Max assist, +2 for physical assistance General bed mobility comments: use of bed pad to assist pt    Transfers Overall transfer level: Needs assistance Equipment used: 2 person hand held assist Transfers: Bed to chair/wheelchair/BSC       Squat pivot transfers: Total assist, +2 physical assistance     General transfer comment: pt holding onto back of therapist's arms while squat pivot transferring to drop arm chair on pt's R side.  Used pad to assist    Ambulation/Gait               General Gait Details: pt did not ambulate PTA  Stairs            Wheelchair Mobility    Modified Rankin (Stroke Patients Only)       Balance Overall balance assessment: Needs assistance Sitting-balance  support: Feet supported Sitting balance-Leahy Scale: Poor Sitting balance - Comments: L lateral lean   Standing balance support: During functional activity Standing balance-Leahy Scale: Zero Standing balance comment: Knee flexion contractures prevent standing                              Pertinent Vitals/Pain Pain Assessment Pain Assessment: No/denies pain Faces Pain Scale: No hurt Breathing: normal Negative Vocalization: none Facial Expression: smiling or inexpressive Body Language: relaxed Consolability: no need to console PAINAD Score: 0    Home Living Family/patient expects to be discharged to:: Skilled nursing facility                        Prior Function               Mobility Comments: Scoot pivot to wheelchair daily, has air mattress at facility ADLs Comments: Total assist with bathing/dressing.  attempts to feed herself     Hand Dominance   Dominant Hand: Right    Extremity/Trunk Assessment   Upper Extremity Assessment Upper Extremity Assessment: Defer to OT evaluation LUE Deficits / Details: 2+/5 gross ROM, weak grasp LUE Coordination: decreased fine motor;decreased gross motor    Lower Extremity Assessment Lower Extremity Assessment: RLE deficits/detail;LLE deficits/detail RLE Deficits / Details: Knee flexion contracture to 90 degrees LLE Deficits / Details: knee flexion contracture to 90 degrees    Cervical / Trunk Assessment Cervical / Trunk Assessment: Kyphotic  Communication   Communication: Expressive difficulties (hx of dysarthria)  Cognition Arousal/Alertness: Awake/alert Behavior During Therapy: WFL for tasks assessed/performed Overall Cognitive Status: Difficult to assess                                 General Comments: follows commands with increased time, gives "thumbs up"  when asked if she is comfortable in chair, nods yes/no when needing to be suctioned        General Comments General comments (skin integrity, edema, etc.): VSS on RA, needing frequent suction during session    Exercises     Assessment/Plan    PT Assessment Patient needs continued PT services  PT Problem List Decreased strength;Decreased activity tolerance;Decreased range of motion;Decreased  balance;Decreased mobility;Decreased knowledge of use of DME;Decreased safety awareness       PT Treatment Interventions DME instruction;Functional mobility training;Therapeutic activities;Therapeutic exercise;Balance training;Patient/family education    PT Goals (Current goals can be found in the Care Plan section)  Acute Rehab PT Goals Patient Stated Goal: daughter states to return to SNF PT Goal Formulation: With patient/family Time For Goal Achievement: 10/16/22 Potential to Achieve Goals: Fair    Frequency Min 2X/week     Co-evaluation PT/OT/SLP Co-Evaluation/Treatment: Yes Reason for Co-Treatment: To address functional/ADL transfers;For patient/therapist safety PT goals addressed during session: Mobility/safety with mobility         AM-PAC PT "6 Clicks" Mobility  Outcome Measure Help needed turning from your back to your side while in a flat bed without using bedrails?: Total Help needed moving from lying on your back to sitting on the side of a flat bed without using bedrails?: Total Help needed moving to and from a bed to a chair (including a wheelchair)?: Total Help needed standing up from a chair using your arms (e.g., wheelchair or bedside chair)?: Total Help needed to walk in hospital room?: Total Help needed climbing 3-5 steps  with a railing? : Total 6 Click Score: 6    End of Session Equipment Utilized During Treatment: Gait belt Activity Tolerance: Patient limited by fatigue Patient left: in chair;with call bell/phone within reach;with chair alarm set;with family/visitor present Nurse Communication: Mobility status;Need for lift equipment PT Visit Diagnosis: Muscle weakness (generalized) (M62.81)    Time: 1610-9604 PT Time Calculation (min) (ACUTE ONLY): 32 min   Charges:   PT Evaluation $PT Eval Moderate Complexity: 1 Mod          Cobie Marcoux M,PT Acute Rehab Services [phone]   Bevelyn Buckles 10/02/2022, 8:47 PM

## 2022-10-02 NOTE — TOC Initial Note (Signed)
Transition of Care [facility]) - Initial/Assessment Note    Patient Details  Name: Brenda Riggs MRN: [id number] Date of Birth: 29-May-1955  [facility] CM/SW Contact:    Dreden Rivere A [place], Theresia Majors Phone Number: 10/02/2022, 4:35 PM  Clinical Narrative:                 CSW met with pt at bedside, pt unable to verbalize clearly. CSW contacted pt's daughter to complete initial assessment.   Pt arrived from LTC at [facility]. Pt's daughter is aware of pt's admission. Pt will return to facility once medically stable for DC.   TOC will continue to follow.   Expected Discharge Plan: Skilled Nursing Facility Barriers to Discharge: Continued Medical Work up   Patient Goals and CMS Choice            Expected Discharge Plan and Services       Living arrangements for the past 2 months: Skilled Nursing Facility                                      Prior Living Arrangements/Services Living arrangements for the past 2 months: Skilled Nursing Facility Lives with:: Facility Resident                   Activities of Daily Living Home Assistive Devices/Equipment: Other (Comment) ADL Screening (condition at time of admission) Patient's cognitive ability adequate to safely complete daily activities?: No Is the patient deaf or have difficulty hearing?: No Does the patient have difficulty seeing, even when wearing glasses/contacts?: No Does the patient have difficulty concentrating, remembering, or making decisions?: Yes Patient able to express need for assistance with ADLs?: No Does the patient have difficulty dressing or bathing?: Yes Independently performs ADLs?: No Communication: Needs assistance Is this a change from baseline?: Pre-admission baseline Dressing (OT): Needs assistance Is this a change from baseline?: Pre-admission baseline Grooming: Needs assistance Is this a change from baseline?: Pre-admission baseline Feeding: Needs assistance Is this a change  from baseline?: Pre-admission baseline Bathing: Needs assistance Is this a change from baseline?: Pre-admission baseline Toileting: Needs assistance Is this a change from baseline?: Pre-admission baseline In/Out Bed: Needs assistance Is this a change from baseline?: Pre-admission baseline Walks in Home: Dependent Is this a change from baseline?: Pre-admission baseline Does the patient have difficulty walking or climbing stairs?: Yes Weakness of Legs: Both Weakness of Arms/Hands: Both  Permission Sought/Granted                  Emotional Assessment   Attitude/Demeanor/Rapport: Other (comment) (appropriate) Affect (typically observed): Stable Orientation: : Oriented to Self Alcohol / Substance Use: Not Applicable Psych Involvement: No (comment)  Admission diagnosis:  Atrial fibrillation with RVR (HCC) [I48.91] Atrial fibrillation (HCC) [I48.91] Patient Active Problem List   Diagnosis Date Noted   Atrial fibrillation with RVR (HCC) 09/29/2022   History of stroke 09/29/2022   Atrial fibrillation (HCC) 09/29/2022   Malnutrition of moderate degree 09/29/2022   Acute encephalopathy 08/17/2020   Dysphagia 08/16/2020   Decreased level of consciousness 08/15/2020   History of hemorrhagic cerebrovascular accident (CVA) with residual deficit 08/15/2020   Hypokalemia 08/15/2020   Thyroid enlargement 03/25/2020   Laceration of forehead 03/25/2020   Pressure injury of skin 03/23/2020   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 03/21/2020   OSA (obstructive sleep apnea) 07/01/2014   Cigarette smoker 06/28/2014  Wheezing 06/27/2014   MENOPAUSE, SURGICAL 09/09/2009   INSECT BITE 12/19/2008   Essential hypertension, benign 09/26/2008   Type II diabetes mellitus (HCC) 11/02/2006   Paranoid schizophrenia (HCC) 11/02/2006   TOBACCO USER 11/02/2006   MENTAL RETARDATION, MILD 11/02/2006   Asthma 11/02/2006   HIDRADENITIS SUPPURATIVA 11/02/2006   Hyperlipidemia  12/21/2004   INCONTINENCE, 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

## 2022-10-02 NOTE — Progress Notes (Addendum)
Patient ID: Brenda Riggs, female   DOB: 1955/09/26, 67 y.o.   MRN: [id number]  MD states NG tube is positioned well enough for feeding. Dietician paged.  Lidia Collum, RN

## 2022-10-02 NOTE — Progress Notes (Signed)
[facility] Progress Note  Patient: Brenda Riggs    ZOX:[id number]  DOA: 09/28/2022     Date of Service: the patient was seen and examined on 10/02/2022  No chief complaint on file.  Brief hospital course: 67 year old female with medical history of asthma, diabetes mellitus type 2, hypertension, hyperlipidemia, ICH with residual dysarthria presented for worsening weakness and worsening speech difficulty. In the ED she was afebrile, found to have A-fib with RVR with heart rate 151. MRI brain showed limited study but negative for acute intracranial abnormality. Neurology was consulted, felt likely recrudescence of old stroke symptoms in setting of underlying systemic infection. Possible small stroke in setting of A-fib. No anticoagulation recommended due to extremely high risk of hemorrhage in the brain because of presence of chronic microhemorrhages and prior ICH as per neurology. Also had dysphagia. Swallow evaluation obtained. Patient failed swallow evaluation, currently NPO.    Assessment and Plan:  # Ileus versus SBO Patient presented with vomiting NG tube was placed, s/p LIS, Ab xary repeated which shows contrast in the colon, it seems ileus/SBO resolved.  Patient was seen by general surgery, recommended no intervention, advance diet. Keep n.p.o. due to severe dysphagia, SLP eval done, recommended MBS Nutritionist consulted, to start NG tube feeding in the meantime If patient fails swallowing test then need to discuss regarding PEG tube placement Continue IV fluid for hydration in the meantime Continue suctioning of oral secretions as needed, patient is unable to swallow her own secretions.   # New onset paroxysmal atrial fibrillation, converted back to normal sinus rhythm S/p IV Cardizem gtt, d/c;d on 6/1 -Heart rate is controlled -No anticoagulation started due to increased risk of intracranial hemorrhage in the brain -Echocardiogram shows EF 65 to 70%, no regional wall motion  abnormalities, left ventricular diastolic parameters are indeterminate -TSH 1.044 TTE LVEF 65 to 70%, moderate LV hypertrophy.  No significant valvular abnormality. Considered cardiology consult if persistent A-fib  # Dysphagia -Patient failed swallow evaluation F/u SLP eval rec MBS which will be done in am Continue intermittent suction to prevent aspiration Continue aspiration precautions Patient may need PEG tube down the road, we will discuss after SLP eval   # History of stroke -Presented with left side weakness; worsening aphasia -Neurology saw patient, felt a recrudescence of old stroke symptoms versus new stroke -MRI brain was unremarkable for acute abnormality   # Hypokalemia, potassium repleted. # Hypomagnesemia, mag repleted.  Resolved Monitor electrolytes and replete as needed     # Diabetes mellitus type 2 -Continue sliding scale insulin with NovoLog   # OSA -Continue nightly CPAP   # Leukocytosis -WBC improved from 18.3-5.7 -Not on antibiotics -Continue to monitor   # Acute kidney injury -Patient's BUNs/creatinine 24/1.21 -Start gentle IV hydration with LR at 50 mill per hour -Follow renal function in a.m.  Iron deficiency anemia, transferrin saturation 3%, started Venofer 20 mg IV daily for 5 days.  Followed by oral iron supplement.   Body mass index is 23.37 kg/m.  Nutrition Problem: Moderate Malnutrition Etiology: chronic illness Interventions: Interventions: Tube feeding, Refer to RD note for recommendations   Pressure Injury 03/22/20 Coccyx Stage 2 -  Partial thickness loss of dermis presenting as a shallow open injury with a red, pink wound bed without slough. (Active)  03/22/20 1200  Location: Coccyx  Location Orientation:   Staging: Stage 2 -  Partial thickness loss of dermis presenting as a shallow open injury with a red, pink wound bed without slough.  Wound Description (Comments):   Present on Admission: Yes     Pressure Injury 08/16/20  Ischial tuberosity Left Stage 4 - Full thickness tissue loss with exposed bone, tendon or muscle. foul odor round stage 4 with yellow slough (Active)  08/16/20 0100  Location: Ischial tuberosity  Location Orientation: Left  Staging: Stage 4 - Full thickness tissue loss with exposed bone, tendon or muscle.  Wound Description (Comments): foul odor round stage 4 with yellow slough  Present on Admission: Yes     Diet: N.p.o. DVT Prophylaxis: Subcutaneous Lovenox   Advance goals of care discussion: Full code  Family Communication: family was not present at bedside, at the time of interview.  The pt provided permission to discuss medical plan with the family. Opportunity was given to ask question and all questions were answered satisfactorily.   Disposition:  Pt is from SNF, admitted with worsening of left-sided weakness, SBO/ileus resolved, NG tube placed.  Patient has significant dysphagia, SLP eval recommended MBS which will be done tomorrow a.m. new onset A-fib with RVR, s/p Cardizem IV infusion.  Still n.p.o., dietitian consulted to start NG tube feeding.  If patient fails swallowing then may need to discuss regarding PEG tube placement Discharge to SNF, when clinically stable, may need few days to improve..  Subjective: No significant events overnight, patient has significant dysarthria and aphasia due to prior stroke unable to speak but she denies abdominal pain, no chest pain or palpitation no shortness of breath. Still has significant gurgling sounds due to unable to swallow her own secretions.  Physical Exam: General: NAD, lying comfortably Appear in no distress, affect appropriate Eyes: PERRLA ENT: Oral Mucosa Clear, moist  Neck: no JVD,  Cardiovascular: S1 and S2 Present, no Murmur,  Respiratory: good respiratory effort, Bilateral Air entry equal and Decreased, no Crackles, no wheezes Abdomen: Bowel Sound present, Soft and no tenderness,  Skin: no rashes Extremities: no Pedal  edema, no calf tenderness Neurologic: Aphasia and left-sided residual weakness due to prior stroke, no new deficits Gait not checked due to patient safety concerns  Vitals:   10/01/22 2308 10/01/22 2310 10/01/22 2311 10/02/22 0317  BP: 118/86 (!) 157/71 (!) 157/71 (!) 149/81  Pulse: 94  96   Resp: 17  14 19   Temp: 97.6 F (36.4 C)  98.3 F (36.8 C) 97.9 F (36.6 C)  TempSrc: Axillary  Axillary Axillary  SpO2: 99%  100% 100%  Weight:      Height:        Intake/Output Summary (Last 24 hours) at 10/02/2022 1350 Last data filed at 10/02/2022 0813 Gross per 24 hour  Intake 579.86 ml  Output --  Net 579.86 ml   Filed Weights   09/29/22 0414 09/30/22 0400 10/01/22 0400  Weight: 50.1 kg 50.1 kg 49 kg    Data Reviewed: I have personally reviewed and interpreted daily labs, tele strips, imagings as discussed above. I reviewed all nursing notes, pharmacy notes, vitals, pertinent old records I have discussed plan of care as described above with RN and patient/family.  CBC: Recent Labs  Lab 09/28/22 2337 09/28/22 2342 09/29/22 0550 09/30/22 0037 10/01/22 0044 10/02/22 0038  WBC 18.3*  --  13.8* 5.7 6.0 7.8  NEUTROABS 15.3*  --   --   --   --   --   HGB 12.5 13.6 13.0 11.0* 10.4* 10.3*  HCT 38.4 40.0 38.9 33.1* 31.6* 32.3*  MCV 96.7  --  90.0 90.4 92.4 93.1  PLT 259  --  357 349 296 281   Basic Metabolic Panel: Recent Labs  Lab 09/28/22 2337 09/28/22 2342 09/29/22 0550 09/30/22 0037 10/01/22 0043 10/01/22 0044 10/02/22 0038  NA 141 141 136 137  --  136 140  K 2.8* 2.8* 4.2 4.8  --  3.7 3.2*  CL 98 98 96* 97*  --  98 101  CO2 28  --  27 29  --  29 28  GLUCOSE 165* 166* 155* 139*  --  105* 115*  BUN 15 16 12  24*  --  26* 18  CREATININE 0.56 0.40* 0.62 1.21*  --  0.74 0.68  CALCIUM 9.9  --  9.7 9.2  --  8.8* 8.7*  MG  --   --  1.6* 2.4 2.3  --  2.1  PHOS  --   --   --   --  4.4  --  3.2    Studies: DG Abd Portable 1V  Result Date: 10/02/2022 CLINICAL DATA:   Evaluate NG tube EXAM: PORTABLE ABDOMEN - 1 VIEW COMPARISON:  October 02, 2022 FINDINGS: The NG tube terminates in the right mid abdomen. The distal tip is flipped back on itself. No other abnormalities. IMPRESSION: The NG tube terminates in the right mid abdomen. The distal tip is flipped back on itself. Recommend repositioning. Electronically Signed   By: Gerome Sam III M.D.   On: 10/02/2022 13:26   DG Abd Portable 1V  Result Date: 10/02/2022 CLINICAL DATA:  Evaluate NG tube placement EXAM: PORTABLE ABDOMEN - 1 VIEW COMPARISON:  None Available. FINDINGS: The NG tube terminates in the stomach. IMPRESSION: The NG tube terminates in the stomach. Electronically Signed   By: Gerome Sam III M.D.   On: 10/02/2022 11:28   DG Abd Portable 1V-Small Bowel Obstruction Protocol-initial, 8 hr delay  Result Date: 10/02/2022 CLINICAL DATA:  Small bowel obstruction, 8 hour delay. EXAM: PORTABLE ABDOMEN - 1 VIEW COMPARISON:  Radiograph yesterday. FINDINGS: Administered enteric contrast is seen within the ascending, transverse, and descending colon to the level of the rectum. Mild persistent gaseous small bowel distension centrally. Enteric tube is looped in the stomach with tip directed towards the gastric cardia. IMPRESSION: Administered enteric contrast within the colon. Mild persistent gaseous distention of small bowel centrally. Electronically Signed   By: Narda Rutherford M.D.   On: 10/02/2022 04:00    Scheduled Meds:  enoxaparin (LOVENOX) injection  40 mg Subcutaneous Daily   insulin aspart  0-6 Units Subcutaneous Q4H   [START ON 10/07/2022] iron polysaccharides  150 mg Oral Daily   mouth rinse  15 mL Mouth Rinse 4 times per day   pantoprazole (PROTONIX) IV  40 mg Intravenous Q24H   potassium chloride  40 mEq Per Tube Once   sodium chloride flush  3 mL Intravenous Q12H   thiamine (VITAMIN B1) injection  100 mg Intravenous Daily   Continuous Infusions:  dextrose 5 % and 0.9 % NaCl with KCl 20 mEq/L 75  mL/hr at 10/01/22 2218   diltiazem (CARDIZEM) infusion Stopped (10/01/22 0630)   iron sucrose 200 mg (10/02/22 1259)   potassium chloride 10 mEq (10/02/22 1309)   PRN Meds: acetaminophen **OR** acetaminophen, ondansetron **OR** ondansetron (ZOFRAN) IV, mouth rinse, mouth rinse  Time spent: 50 minutes  Author: Gillis Santa. MD [facility] 10/02/2022 1:50 PM  To reach On-call, see care teams to locate the attending and reach out to them via [email or web address]. If 7PM-7AM, please contact night-coverage If you still have difficulty reaching the attending provider, please  page the [facility] (Director on Call) for [facility] on amion for assistance.

## 2022-10-02 NOTE — Progress Notes (Signed)
Nutrition Follow-up  DOCUMENTATION CODES:   Non-severe (moderate) malnutrition in context of chronic illness  INTERVENTION:  -Start Osmolite 1.5 at 58ml/hr and advance by 10ml q8h to a goal rate of 35 ml/h (840 ml per day) Prosource TF20 60 ml once daily Free water flushes of q4h  Provides 1340 kcal, 73 gm protein, 1300 ml free water daily  NUTRITION DIAGNOSIS:   Moderate Malnutrition related to chronic illness as evidenced by mild fat depletion, mild muscle depletion. - ongoing   GOAL:   Patient will meet greater than or equal to 90% of their needs - not met  MONITOR:   Diet advancement, Labs, Weight trends, TF tolerance, Skin  REASON FOR ASSESSMENT:   Consult Assessment of nutrition requirement/status, Other (Comment) (Cortrak list)  ASSESSMENT:   Pt from SNF d/t increased L sided weakness and speech difficulty. PMH significant for asthma, T2DM, HTN, HLD, ICH with residual dysarthria.  Meds reviewed:  sliding scale insulin, klor-con, thiamine. Labs reviewed: K low.   Pt is currently NPO with NG tube in place. Pt was seen by surgery for Ileus. NG tube originally to suction for decompression. General surgery recommended clamping of NG tube today and advancing diet. Pt seen by SLP today who recommends continued NPO status. MD consult to initiate TF via NG tube. RD will place previous tube feed recommendations from this admission when she had a Cortrak in place. RD will continue to monitor TF tolerance.   NUTRITION - FOCUSED PHYSICAL EXAM:  Remote assessment.  Diet Order:   Diet Order             Diet NPO time specified  Diet effective now                   EDUCATION NEEDS:   Education needs have been addressed  Skin:  Skin Assessment: Reviewed RN Assessment  Last BM:  5/30  Height:   Ht Readings from Last 1 Encounters:  09/29/22 4\' 9"  (1.448 m)    Weight:   Wt Readings from Last 1 Encounters:  10/01/22 49 kg    Ideal Body Weight:      BMI:  Body mass index is 23.37 kg/m.  Estimated Nutritional Needs:   Kcal:  1300-1500  Protein:  65-80g  Fluid:  1.3-1.5L  Bethann Humble, RD, LDN, CNSC.

## 2022-10-02 NOTE — Evaluation (Signed)
Occupational Therapy Evaluation Patient Details Name: Brenda Riggs MRN: [id number] DOB: 09-05-1955 Today's Date: 10/02/2022   History of Present Illness Pt is a 67 y/o F presenting to ED from [facility] with worsening weakness and speech difficulty. MRI brain negative for acute intracranial abnormaility. Neuro consulted and felt recredescence of old stroke symptoms in the setting of underlying systemic infection. Admitted for A fib with RVR. PMH includes asthma, DM2, HTN, HLD, ICH with residual dysarthria   Clinical Impression   Pt from SNF, reports staff assists with transferring her to her w/c daily, has assist for all ADLs but able to participate in self -feeding and grooming activities. Pt needing max -total A for ADLs, min-max  A +2 for bed mobility and total A +2 for transfer to chair on pt's R side. Pt needing frequent suction during session, able to nod head yes/no when asked if suction needed. Pt with L weakness, would benefit from trial of built up utensils/feeding equipment  in future session. Pt presenting with impairments listed below, will follow acutely. Patient will benefit from continued inpatient follow up therapy, <3 hours/day to maximize safety and independence with ADLs/functional mobility.       Recommendations for follow up therapy are one component of a multi-disciplinary discharge planning process, led by the attending physician.  Recommendations may be updated based on patient status, additional functional criteria and insurance authorization.   Assistance Recommended at Discharge Frequent or constant Supervision/Assistance  Patient can return home with the following Two people to help with walking and/or transfers;A lot of help with bathing/dressing/bathroom;Assistance with cooking/housework;Assistance with feeding;Direct supervision/assist for medications management;Direct supervision/assist for financial management;Assist for transportation;Help with stairs or ramp  for entrance    Functional Status Assessment  Patient has had a recent decline in their functional status and demonstrates the ability to make significant improvements in function in a reasonable and predictable amount of time.  Equipment Recommendations  Other (comment) (defer)    Recommendations for Other Services PT consult     Precautions / Restrictions Precautions Precautions: Fall Precaution Comments: NG tube Restrictions Weight Bearing Restrictions: No      Mobility Bed Mobility Overal bed mobility: Needs Assistance Bed Mobility: Sidelying to Sit, Sit to Sidelying, Rolling Rolling: Min assist, +2 for physical assistance Sidelying to sit: +2 for physical assistance, Mod assist     Sit to sidelying: Max assist, +2 for physical assistance General bed mobility comments: use of bed pad to assist pt    Transfers Overall transfer level: Needs assistance Equipment used: 2 person hand held assist Transfers: Bed to chair/wheelchair/BSC     Squat pivot transfers: Total assist, +2 physical assistance       General transfer comment: pt holding onto back of therapist's arms while squat pivot transferring to chair on pt's R side      Balance Overall balance assessment: Needs assistance Sitting-balance support: Feet supported Sitting balance-Leahy Scale: Poor Sitting balance - Comments: L lateral lean   Standing balance support: During functional activity Standing balance-Leahy Scale: Zero                             ADL either performed or assessed with clinical judgement   ADL Overall ADL's : Needs assistance/impaired Eating/Feeding: NPO   Grooming: Minimal assistance   Upper Body Bathing: Maximal assistance   Lower Body Bathing: Maximal assistance   Upper Body Dressing : Maximal assistance   Lower Body Dressing: Total  assistance   Toilet Transfer: Maximal assistance;+2 for physical assistance   Toileting- Clothing Manipulation and Hygiene:  Total assistance       Functional mobility during ADLs: Maximal assistance;+2 for physical assistance       Vision   Vision Assessment?: No apparent visual deficits     Perception Perception Perception Tested?: No   Praxis Praxis Praxis tested?: Not tested    Pertinent Vitals/Pain Pain Assessment Pain Assessment: No/denies pain     Hand Dominance Right   Extremity/Trunk Assessment Upper Extremity Assessment Upper Extremity Assessment: LUE deficits/detail LUE Deficits / Details: 2+/5 gross ROM, weak grasp LUE Coordination: decreased fine motor;decreased gross motor   Lower Extremity Assessment Lower Extremity Assessment: Defer to PT evaluation   Cervical / Trunk Assessment Cervical / Trunk Assessment: Kyphotic   Communication Communication Communication: Expressive difficulties (hx of dysarthria)   Cognition Arousal/Alertness: Awake/alert Behavior During Therapy: WFL for tasks assessed/performed Overall Cognitive Status: Difficult to assess                                 General Comments: follows commands with increased time, gives "thumbs up"  when asked if she is comfortable in chair, nods yes/no when needing to be suctioned     General Comments  VSS on RA, needing frequent suction during session    Exercises     Shoulder Instructions      Home Living Family/patient expects to be discharged to:: Skilled nursing facility                                        Prior Functioning/Environment               Mobility Comments: Scoot pivot to wheelchair daily, has air mattress at facility ADLs Comments: Total assist with bathing/dressing.  attempts to feed herself        OT Problem List: Decreased strength;Decreased range of motion;Decreased activity tolerance;Impaired balance (sitting and/or standing);Decreased coordination;Decreased cognition;Decreased safety awareness;Impaired UE functional use      OT  Treatment/Interventions: Self-care/ADL training;Therapeutic exercise;Energy conservation;DME and/or AE instruction;Therapeutic activities;Patient/family education;Balance training;Cognitive remediation/compensation;Neuromuscular education    OT Goals(Current goals can be found in the care plan section) Acute Rehab OT Goals Patient Stated Goal: pt unable to state OT Goal Formulation: With patient Time For Goal Achievement: 10/16/22 Potential to Achieve Goals: Fair ADL Goals Pt Will Perform Eating: with adaptive utensils;bed level;sitting;with min guard assist Pt Will Perform Grooming: with min guard assist;with adaptive equipment;sitting;bed level Additional ADL Goal #1: pt will perform bed mobility min A in prep for ADLs  OT Frequency: Min 1X/week    Co-evaluation PT/OT/SLP Co-Evaluation/Treatment: Yes Reason for Co-Treatment: To address functional/ADL transfers;For patient/therapist safety   OT goals addressed during session: ADL's and self-care;Strengthening/ROM      AM-PAC OT "6 Clicks" Daily Activity     Outcome Measure Help from another person eating meals?:  (NPO) Help from another person taking care of personal grooming?: A Little Help from another person toileting, which includes using toliet, bedpan, or urinal?: A Lot Help from another person bathing (including washing, rinsing, drying)?: A Lot Help from another person to put on and taking off regular upper body clothing?: A Lot Help from another person to put on and taking off regular lower body clothing?: Total 6 Click Score: 10   End of Session  Equipment Utilized During Treatment: Stage manager Communication: Mobility status  Activity Tolerance: Patient tolerated treatment well Patient left: in chair;with call bell/phone within reach;with chair alarm set;with family/visitor present  OT Visit Diagnosis: Unsteadiness on feet (R26.81);Other abnormalities of gait and mobility (R26.89);Muscle weakness (generalized)  (M62.81);Other symptoms and signs involving the nervous system (R29.898)                Time: 1339-1410 OT Time Calculation (min): 31 min Charges:  OT General Charges $OT Visit: 1 Visit OT Evaluation $OT Eval Moderate Complexity: 1 Mod  Jaquala Fuller K, OTD, OTR/L [employer]Chat Preferred Acute Rehab [phone]   Carver Fila Koonce 10/02/2022, 4:19 PM

## 2022-10-02 NOTE — Progress Notes (Signed)
Speech Language Pathology Treatment: Dysphagia  Patient Details Name: LAURELAI FRADETTE MRN: [id number] DOB: 1955/11/19 Today's Date: 10/02/2022 Time: 4098-1191 SLP Time Calculation (min) (ACUTE ONLY): 15 min  Assessment / Plan / Recommendation Clinical Impression  Patient seen by SLP for skilled treatment focused on dysphagia goals. Patient's daughter in room as well. NG has been clamped as of today and plan is for repeat x-ray to check for placement as it is not currently in place adequately for feedings. If able to reposition NG, plan is to start nutrition via NG. Patient's voice is very gurgled as she is constantly having to use [place]auer suction to clear secretions from oropharynx. She was only able to achieve a very weak cough when cued. When cued to swallow, patient would attempt but SLP did not palpate adequate hyolaryngeal movement. SLP suspects that large bore NG which is currently in place, is exacerbating her already impaired swallow. No PO's attempted as patient is currently unable to initiate adequate swallow and is not able to manage secretions. SLP recommending continue NPO status with plan to attempt objective swallow study (MBS) next date if NG can be removed prior. SLP will f/u next date and coordinate with medical team.    HPI HPI: 67 year old with prior history of ICH and poor baseline with dysarthria and weakness worse on the left than right at baseline presenting for evaluation of worsening slurred speech and worsening weakness.  MRI which does not show any evidence of an acute stroke.  Per MD, Has a leukocytosis-wonder if this is recrudescence of old symptoms in the setting of an underlying infection or recrudescence of stroke symptoms in the setting of acute illness-new onset A-fib. Most recent MBS complete 08/20/20 indicated a  severe oral and mild pharyngeal dysphagia mostly c/b decreased oral control from significant weakness, with recommendations for dysphagia 1 with thin liquid.   MBs also in 2021 with recommendations for dys 3, thin liquid.      SLP Plan  Continue with current plan of care;MBS      Recommendations for follow up therapy are one component of a multi-disciplinary discharge planning process, led by the attending physician.  Recommendations may be updated based on patient status, additional functional criteria and insurance authorization.    Recommendations  Diet recommendations: NPO Medication Administration: Via alternative means                  Oral care QID   Frequent or constant Supervision/Assistance Dysphagia, oropharyngeal phase (R13.12)     Continue with current plan of care;MBS    Angela Nevin, MA, CCC-SLP Speech Therapy

## 2022-10-02 NOTE — Progress Notes (Signed)
Progress Note     Subjective: Patient with 4 BM recorded overnight. Denies abdominal pain. Difficult to understand  Objective: Vital signs in last 24 hours: Temp:  [97.6 F (36.4 C)-98.3 F (36.8 C)] 97.9 F (36.6 C) (06/02 0317) Pulse Rate:  [80-96] 96 (06/01 2311) Resp:  [12-22] 19 (06/02 0317) BP: (118-163)/(43-86) 149/81 (06/02 0317) SpO2:  [94 %-100 %] 100 % (06/02 0317) Last BM Date : 09/29/22  Intake/Output from previous day: No intake/output data recorded. Intake/Output this shift: Total I/O In: 579.9 [I.V.:579.9] Out: -   PE: General: pleasant, WD, WN female who is laying in bed in NAD Heart: regular, rate, and rhythm.   Lungs: Respiratory effort nonlabored Abd: soft, NT, ND, +BS, NGT with minimal thin output    Lab Results:  Recent Labs    10/01/22 0044 10/02/22 0038  WBC 6.0 7.8  HGB 10.4* 10.3*  HCT 31.6* 32.3*  PLT 296 281   BMET Recent Labs    10/01/22 0044 10/02/22 0038  NA 136 140  K 3.7 3.2*  CL 98 101  CO2 29 28  GLUCOSE 105* 115*  BUN 26* 18  CREATININE 0.74 0.68  CALCIUM 8.8* 8.7*   PT/INR No results for input(s): "LABPROT", "INR" in the last 72 hours. CMP     Component Value Date/Time   NA 140 10/02/2022 0038   K 3.2 (L) 10/02/2022 0038   CL 101 10/02/2022 0038   CO2 28 10/02/2022 0038   GLUCOSE 115 (H) 10/02/2022 0038   BUN 18 10/02/2022 0038   CREATININE 0.68 10/02/2022 0038   CALCIUM 8.7 (L) 10/02/2022 0038   PROT 7.0 09/30/2022 0037   ALBUMIN 3.3 (L) 09/30/2022 0037   AST 25 09/30/2022 0037   ALT 19 09/30/2022 0037   ALKPHOS 65 09/30/2022 0037   BILITOT 0.7 09/30/2022 0037   GFRNONAA >60 10/02/2022 0038   GFRAA >60 07/08/2019 1947   Lipase  No results found for: "LIPASE"     Studies/Results: DG Abd Portable 1V-Small Bowel Obstruction Protocol-initial, 8 hr delay  Result Date: 10/02/2022 CLINICAL DATA:  Small bowel obstruction, 8 hour delay. EXAM: PORTABLE ABDOMEN - 1 VIEW COMPARISON:  Radiograph  yesterday. FINDINGS: Administered enteric contrast is seen within the ascending, transverse, and descending colon to the level of the rectum. Mild persistent gaseous small bowel distension centrally. Enteric tube is looped in the stomach with tip directed towards the gastric cardia. IMPRESSION: Administered enteric contrast within the colon. Mild persistent gaseous distention of small bowel centrally. Electronically Signed   By: Narda Rutherford M.D.   On: 10/02/2022 04:00   DG Abd 1 View  Result Date: 10/01/2022 CLINICAL DATA:  Nasogastric tube placement. EXAM: ABDOMEN - 1 VIEW COMPARISON:  09/30/2022. FINDINGS: Nasogastric tube passes well below the diaphragm, curling in the distal stomach, tip projecting in the fundus. Dilated small bowel is less prominent than on the previous day's study. IMPRESSION: 1. Well-positioned nasal/orogastric tube. Electronically Signed   By: Amie Portland M.D.   On: 10/01/2022 13:01   DG Abd 1 View  Result Date: 09/30/2022 CLINICAL DATA:  [id number] Vomiting [id number] EXAM: ABDOMEN - 1 VIEW COMPARISON:  Radiograph 08/15/2020 FINDINGS: Feeding tube tip overlies the distal stomach/proximal duodenum. There are multiple dilated loops of small bowel in the abdomen. The colon appears decompressed. IMPRESSION: Multiple dilated loops of small bowel with decompressed colon suggestive of small-bowel obstruction. Ileus is possible. Electronically Signed   By: Caprice Renshaw M.D.   On: 09/30/2022 13:11  Anti-infectives: Anti-infectives (From admission, onward)    None        Assessment/Plan  Ileus vs SBO - HDS. Afebrile. No tachycardia or hypotension. WBC wnl. No peritonitis on exam. No current indication for emergency surgery - NGT placed and SBO protocol initiated yesterday - 8h delay with contrast in colon and patient has had several BM - clamp NGT and allow clears if clear from SLP standpoint vs start trickle TF if not clear. If patient tolerating then can remove NGT tomorrow   - suspect this is more ileus/motility problem - Keep K > 4 and Mg > 2 for bowel function - We will follow with you.   FEN: ok for CLD from surgery standpoint, NGT clamped VTE: LMWH ID: no current abx  LOS: 3 days   I reviewed hospitalist notes, last 24 h vitals and pain scores, last 48 h intake and output, last 24 h labs and trends, and last 24 h imaging results.     Juliet Rude, [facility] 10/02/2022, 9:33 AM Please see Amion for pager number during day hours 7:00am-4:30pm

## 2022-10-02 NOTE — Progress Notes (Signed)
Patient ID: Brenda Riggs, female   DOB: 01-Jun-1955, 67 y.o.   MRN: [id number]  MD reviewed xray results. Instructed to pull NG tube back an additional 6 inched and repeat xray.   Lidia Collum, RN

## 2022-10-03 ENCOUNTER — Inpatient Hospital Stay (HOSPITAL_COMMUNITY): Payer: Medicare Other

## 2022-10-03 DIAGNOSIS — E876 Hypokalemia: Secondary | ICD-10-CM | POA: Diagnosis not present

## 2022-10-03 DIAGNOSIS — I480 Paroxysmal atrial fibrillation: Secondary | ICD-10-CM | POA: Diagnosis not present

## 2022-10-03 DIAGNOSIS — I4891 Unspecified atrial fibrillation: Secondary | ICD-10-CM | POA: Diagnosis not present

## 2022-10-03 LAB — BASIC METABOLIC PANEL
Anion gap: 6 (ref 5–15)
BUN: 9 mg/dL (ref 8–23)
CO2: 27 mmol/L (ref 22–32)
Calcium: 8.6 mg/dL — ABNORMAL LOW (ref 8.9–10.3)
Chloride: 106 mmol/L (ref 98–111)
Creatinine, Ser: 0.43 mg/dL — ABNORMAL LOW (ref 0.44–1.00)
GFR, Estimated: >60 mL/min (ref >60)
Glucose, Bld: 121 mg/dL — ABNORMAL HIGH (ref 70–99)
Potassium: 3.7 mmol/L (ref 3.5–5.1)
Sodium: 139 mmol/L (ref 135–145)

## 2022-10-03 LAB — CBC
HCT: 30.4 % — ABNORMAL LOW (ref 36.0–46.0)
Hemoglobin: 9.8 g/dL — ABNORMAL LOW (ref 12.0–15.0)
MCH: 30.2 pg (ref 26.0–34.0)
MCHC: 32.2 g/dL (ref 30.0–36.0)
MCV: 93.5 fL (ref 80.0–100.0)
Platelets: 267 10*3/uL (ref 150–400)
RBC: 3.25 MIL/uL — ABNORMAL LOW (ref 3.87–5.11)
RDW: 13.9 % (ref 11.5–15.5)
WBC: 8.1 10*3/uL (ref 4.0–10.5)
nRBC: 0 % (ref 0.0–0.2)

## 2022-10-03 LAB — GLUCOSE, CAPILLARY
Glucose-Capillary: 112 mg/dL — ABNORMAL HIGH (ref 70–99)
Glucose-Capillary: 117 mg/dL — ABNORMAL HIGH (ref 70–99)
Glucose-Capillary: 117 mg/dL — ABNORMAL HIGH (ref 70–99)
Glucose-Capillary: 140 mg/dL — ABNORMAL HIGH (ref 70–99)
Glucose-Capillary: 151 mg/dL — ABNORMAL HIGH (ref 70–99)
Glucose-Capillary: 156 mg/dL — ABNORMAL HIGH (ref 70–99)

## 2022-10-03 LAB — PHOSPHORUS
Phosphorus: 2.2 mg/dL — ABNORMAL LOW (ref 2.5–4.6)
Phosphorus: 2.8 mg/dL (ref 2.5–4.6)

## 2022-10-03 LAB — MAGNESIUM
Magnesium: 1.9 mg/dL (ref 1.7–2.4)
Magnesium: 2 mg/dL (ref 1.7–2.4)

## 2022-10-03 MED ORDER — SODIUM PHOSPHATES 45 MMOLE/15ML IV SOLN
15.0000 mmol | Freq: Once | INTRAVENOUS | Status: AC
  Administered 2022-10-03: 15 mmol via INTRAVENOUS
  Filled 2022-10-03: qty 5

## 2022-10-03 NOTE — Procedures (Signed)
Cortrak  Person Inserting Tube:  Amabel Stmarie J, RD Tube Type:  Cortrak - 43 inches Tube Size:  10 Tube Location:  Left nare Secured by: Bridle Technique Used to Measure Tube Placement:  Marking at nare/corner of mouth Cortrak Secured At:  63 cm   Cortrak Tube Team Note:  Consult received to place a Cortrak feeding tube.   X-ray is required, abdominal x-ray has been ordered by the Cortrak team. Please confirm tube placement before using the Cortrak tube.   If the tube becomes dislodged please keep the tube and contact the Cortrak team at [email or web address] for replacement.  If after hours and replacement cannot be delayed, place a NG tube and confirm placement with an abdominal x-ray.    Kate Khira Cudmore, MS, RD, LDN Inpatient Clinical Dietitian Please see AMiON for contact information.  

## 2022-10-03 NOTE — TOC Progression Note (Signed)
Transition of Care [facility]) - Progression Note    Patient Details  Name: Brenda Riggs MRN: [id number] Date of Birth: 09-11-1955  [facility] of [facility]) CM/SW Contact  Leander Rams, LCSW Phone Number: 10/03/2022, 12:01 PM  Clinical Narrative:    CSW reached out to [facility]. CSW spoke with [place] who states pt can return to SNF and complete rehab when medically stable.   TOC will continue to follow.    Expected Discharge Plan: Skilled Nursing Facility Barriers to Discharge: Continued Medical Work up  Expected Discharge Plan and Services       Living arrangements for the past 2 months: Skilled Nursing Facility                                       Social Determinants of Health (SDOH) Interventions SDOH Screenings   Housing: Patient Unable To Answer (10/02/2022)  Tobacco Use: High Risk (09/29/2022)    Readmission Risk Interventions     No data to display         Oletta Lamas, MSW, LCSWA, LCASA Transitions of Care  Clinical Social Worker I

## 2022-10-03 NOTE — Progress Notes (Signed)
Pt Tube feeding resumed at 20ml per hour goal rate 20ml/hour. @2300  increase by .

## 2022-10-03 NOTE — Progress Notes (Signed)
[facility] Progress Note  Patient: Brenda Riggs    ZOX:[id number]  DOA: 09/28/2022     Date of Service: the patient was seen and examined on 10/03/2022  Brief hospital course: 67 year old female with medical history of asthma, diabetes mellitus type 2, hypertension, hyperlipidemia, ICH with residual dysarthria presented for worsening weakness and worsening speech difficulty. In the ED she was afebrile, found to have A-fib with RVR with heart rate 151. MRI brain showed limited study but negative for acute intracranial abnormality. Neurology was consulted, felt likely recrudescence of old stroke symptoms in setting of underlying systemic infection. Possible small stroke in setting of A-fib. No anticoagulation recommended due to extremely high risk of hemorrhage in the brain because of presence of chronic microhemorrhages and prior ICH as per neurology. Also had dysphagia. Swallow evaluation obtained. Patient failed swallow evaluation, currently NPO. Plan for MBS today 6/3.  Assessment and Plan:  # Ileus versus SBO Patient presented with vomiting NG tube was placed, s/p LIS, Ab xray repeated which shows contrast in the colon, it seems ileus/SBO resolved.  Patient was seen by general surgery, recommended no intervention, advance diet. Keep n.p.o. due to severe dysphagia, SLP eval done, recommended MBS today 6/3. Nutritionist consulted, to start NG tube feeding in the meantime If patient fails swallowing test then need to discuss regarding PEG tube placement Continue IV fluid for hydration in the meantime Continue suctioning of oral secretions as needed, patient is unable to swallow her own secretions.  # New onset paroxysmal atrial fibrillation, converted back to normal sinus rhythm S/p IV Cardizem gtt, d/c;d on 6/1 -Heart rate is controlled at this time -No anticoagulation started due to increased risk of intracranial hemorrhage in the brain -Echocardiogram shows EF 65 to 70%, no regional  wall motion abnormalities, left ventricular diastolic parameters are indeterminate -TSH 1.044 TTE LVEF 65 to 70%, moderate LV hypertrophy.  No significant valvular abnormality. Consider cardiology consult if persistent A-fib  # Dysphagia -Patient failed swallow evaluation F/u SLP eval rec MBS which will be done today Continue intermittent suction to prevent aspiration Continue aspiration precautions Patient may need PEG tube down the road, we will discuss after SLP eval   # History of stroke -Presented with left side weakness; worsening aphasia -Neurology saw patient, felt a recrudescence of old stroke symptoms versus new stroke -MRI brain was unremarkable for acute abnormality   # Hypokalemia, potassium repleted. # Hypomagnesemia, mag repleted.  Resolved Monitor electrolytes and replete as needed     # Diabetes mellitus type 2 -Continue sliding scale insulin with NovoLog   # OSA -Continue nightly CPAP   # Leukocytosis -WBC improved from 18.3-5.7 -Not on antibiotics -Continue to monitor   # Acute kidney injury -Patient's BUNs/creatinine 24/1.21 -Start gentle IV hydration with LR at 50 mill per hour -Follow renal function in a.m., improving.  Iron deficiency anemia, transferrin saturation 3%, started Venofer 20 mg IV daily for 5 days.  Followed by oral iron supplement.  Body mass index is 23.37 kg/m.  Nutrition Problem: Moderate Malnutrition Etiology: chronic illness Interventions: Interventions: Tube feeding, Refer to RD note for recommendations   Pressure Injury 03/22/20 Coccyx Stage 2 -  Partial thickness loss of dermis presenting as a shallow open injury with a red, pink wound bed without slough. (Active)  03/22/20 1200  Location: Coccyx  Location Orientation:   Staging: Stage 2 -  Partial thickness loss of dermis presenting as a shallow open injury with a red, pink wound bed without slough.  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 foul odor round stage 4 with yellow slough (Active)  08/16/20 0100  Location: Ischial tuberosity  Location Orientation: Left  Staging: Stage 4 - Full thickness tissue loss with exposed bone, tendon or muscle.  Wound Description (Comments): foul odor round stage 4 with yellow slough  Present on Admission: Yes    Diet: N.p.o. DVT Prophylaxis: Subcutaneous Lovenox   Advance goals of care discussion: Full code  Family Communication: family was not present at bedside  Disposition:  Pt is from SNF, admitted with worsening of left-sided weakness, SBO/ileus resolved, NG tube placed.  Patient has significant dysphagia, SLP eval recommended MBS which will be done tomorrow a.m. new onset A-fib with RVR, s/p Cardizem IV infusion.  Still n.p.o., dietitian consulted to start NG tube feeding.  If patient fails swallowing then may need to discuss regarding PEG tube placement Discharge to SNF, when clinically stable, may need few days to improve.  Subjective: No significant events overnight, patient has significant dysarthria and aphasia due to prior stroke unable to speak but she denies abdominal pain, no chest pain or palpitation no shortness of breath. Still has significant gurgling sounds due to unable to swallow her own secretions, and tube feeds were stopped overnight due to concern about cough.   Physical Exam: General: NAD, lying comfortably, answering some questions by nodding slowly Appear in no distress, affect appropriate Eyes: PERRLA ENT: Oral Mucosa Clear, moist  Neck: no JVD,  Cardiovascular: S1 and S2 Present, no Murmur,  Respiratory: good respiratory effort, Bilateral Air entry equal and Decreased, upper airway Crackles, no wheezes Abdomen: Bowel Sound present, Soft and no tenderness,  Skin: no rashes Extremities: no Pedal edema, no calf tenderness Neurologic:  Aphasia and left-sided residual weakness due to prior stroke, no new deficits Gait not checked due to patient safety concerns  Vitals:   10/02/22 1926 10/03/22 0000 10/03/22 0313 10/03/22 0625  BP: (!) 160/72 (!) 143/87 (!) 154/83   Pulse: 98 99 99 98  Resp: 16 20 20 16   Temp: 99.4 F (37.4 C) 100 F (37.8 C) 100.3 F (37.9 C) 98.4 F (36.9 C)  TempSrc: Oral Oral Oral Oral  SpO2: 100% 99% 98% 97%  Weight:      Height:        Intake/Output Summary (Last 24 hours) at 10/03/2022 1610 Last data filed at 10/03/2022 0335 Gross per 24 hour  Intake 305.89 ml  Output 400 ml  Net -94.11 ml    Filed Weights   09/29/22 0414 09/30/22 0400 10/01/22 0400  Weight: 50.1 kg 50.1 kg 49 kg    Data Reviewed: I have personally reviewed and interpreted daily labs, tele strips, imagings as discussed above. I reviewed all nursing notes, pharmacy notes, vitals, pertinent old records I have discussed plan of care as described above with RN and patient/family.  CBC: Recent Labs  Lab 09/28/22 2337 09/28/22 2342 09/29/22 0550 09/30/22 0037 10/01/22 0044 10/02/22 0038  WBC 18.3*  --  13.8* 5.7 6.0 7.8  NEUTROABS 15.3*  --   --   --   --   --   HGB 12.5 13.6 13.0 11.0* 10.4* 10.3*  HCT 38.4 40.0 38.9 33.1* 31.6* 32.3*  MCV 96.7  --  90.0 90.4 92.4 93.1  PLT 259  --  357 349 296 281  Basic Metabolic Panel: Recent Labs  Lab 09/28/22 2337 09/28/22 2342 09/29/22 0550 09/30/22 0037 10/01/22 0043 10/01/22 0044 10/02/22 0038 10/02/22 1629  NA 141 141 136 137  --  136 140  --   K 2.8* 2.8* 4.2 4.8  --  3.7 3.2*  --   CL 98 98 96* 97*  --  98 101  --   CO2 28  --  27 29  --  29 28  --   GLUCOSE 165* 166* 155* 139*  --  105* 115*  --   BUN 15 16 12  24*  --  26* 18  --   CREATININE 0.56 0.40* 0.62 1.21*  --  0.74 0.68  --   CALCIUM 9.9  --  9.7 9.2  --  8.8* 8.7*  --   MG  --   --  1.6* 2.4 2.3  --  2.1 2.1  PHOS  --   --   --   --  4.4  --  3.2 2.1*     Studies: DG CHEST PORT 1  VIEW  Result Date: 10/03/2022 CLINICAL DATA:  [id number] Encounter for feeding tube placement [id number] EXAM: PORTABLE CHEST - 1 VIEW COMPARISON:  09/29/2022 FINDINGS: Nasogastric tube extends at least as far as the stomach, tip not seen. Lungs are clear. Heart size normal Aortic Atherosclerosis (ICD10-170.0). No effusion. Left shoulder DJD IMPRESSION: 1. No acute cardiopulmonary disease. 2. Nasogastric tube at least as far as the stomach. Electronically Signed   By: Corlis Leak M.D.   On: 10/03/2022 07:53   DG Abd Portable 1V  Result Date: 10/02/2022 CLINICAL DATA:  Evaluate NG tube EXAM: PORTABLE ABDOMEN - 1 VIEW COMPARISON:  October 02, 2022 FINDINGS: The NG tube terminates in the right mid abdomen. The distal tip is flipped back on itself. No other abnormalities. IMPRESSION: The NG tube terminates in the right mid abdomen. The distal tip is flipped back on itself. Recommend repositioning. Electronically Signed   By: Gerome Sam III M.D.   On: 10/02/2022 13:26   DG Abd Portable 1V  Result Date: 10/02/2022 CLINICAL DATA:  Evaluate NG tube placement EXAM: PORTABLE ABDOMEN - 1 VIEW COMPARISON:  None Available. FINDINGS: The NG tube terminates in the stomach. IMPRESSION: The NG tube terminates in the stomach. Electronically Signed   By: Gerome Sam III M.D.   On: 10/02/2022 11:28    Scheduled Meds:  enoxaparin (LOVENOX) injection  40 mg Subcutaneous Daily   feeding supplement (PROSource TF20)  60 mL Per Tube Daily   free water  110 mL Per Tube Q4H   insulin aspart  0-6 Units Subcutaneous Q4H   [START ON 10/07/2022] iron polysaccharides  150 mg Oral Daily   mouth rinse  15 mL Mouth Rinse 4 times per day   pantoprazole (PROTONIX) IV  40 mg Intravenous Q24H   sodium chloride flush  3 mL Intravenous Q12H   thiamine (VITAMIN B1) injection  100 mg Intravenous Daily   Continuous Infusions:  dextrose 5 % and 0.9 % NaCl with KCl 20 mEq/L 75 mL/hr at 10/03/22 0306   diltiazem (CARDIZEM) infusion Stopped  (10/01/22 0630)   feeding supplement (OSMOLITE 1.5 CAL) Stopped (10/03/22 0336)   iron sucrose 200 mg (10/02/22 1259)   PRN Meds: acetaminophen **OR** acetaminophen, ondansetron **OR** ondansetron (ZOFRAN) IV, mouth rinse, mouth rinse  Time spent: 50 minutes  Author: Scientific laboratory technician. MD [facility] 10/03/2022 8:38 AM  To reach On-call, see care teams to locate the attending and  reach out to them via [email or web address]. If 7PM-7AM, please contact night-coverage If you still have difficulty reaching the attending provider, please page the [facility] (Director on Call) for [facility] on amion for assistance.

## 2022-10-03 NOTE — Progress Notes (Signed)
Nutrition Follow-up  DOCUMENTATION CODES:   Non-severe (moderate) malnutrition in context of chronic illness  INTERVENTION:  Initiate tube feeding via Cortrak: Start Osmolite 1.5 at 40ml/hr and advance by 10ml q8h to a goal rate of 35 ml/h (840 ml per day) Prosource TF20 60 ml once daily Free water flushes of q4h  Provides 1340 kcal, 73 gm protein, 1300 ml free water daily  Recommend monitoring potassium, magnesium and phosphorus BID x3 days following initiation of nutrition support given elevated risk of refeeding syndrome  Recommend considering PEG tube placement for long term nutrition needs given severe oral dysphagia; discussed with MD; palliative care consult pending   NUTRITION DIAGNOSIS:   Moderate Malnutrition related to chronic illness as evidenced by mild fat depletion, mild muscle depletion. - remains applicable  GOAL:   Patient will meet greater than or equal to 90% of their needs - goal unmet, Cortrak placed  MONITOR:   Diet advancement, Labs, Weight trends, TF tolerance, Skin  REASON FOR ASSESSMENT:   Consult Assessment of nutrition requirement/status, Other (Comment) (Cortrak list)  ASSESSMENT:   Pt from SNF d/t increased L sided weakness and speech difficulty. PMH significant for asthma, T2DM, HTN, HLD, ICH with residual dysarthria.  5/30 - s/p BSE- recommend NPO d/t poor oral management of bolus and inability to initiate a palpable pharyngeal swallow; poor ability to manage secretions   5/31 - s/p abd xray, findings suggestive of ileus/motility dysfunction; NGT to LIS 6/3 - s/p MBS findings of severe oral dysphagia with very limited lingual function for bolus propulsion; continue NPO; Cortrak placed (tip in distal stomach)  Ileus now resolved, no indication for surgical interventions.   TF initiated on 6/2 however was stopped d/t concern of increased secretions. NGT removed today for MBS. Cortrak replaced today for nutrition support. MD agrees to  resume TF.   Discussed with RN resuming current tube feeding orders. Unclear what rate pt had advanced to initially, will start at 1ml/hr given refeeding risk.   Weight history: 05/30: 50.1 kg 06/01: 49 kg   Medications: SSI 0-6 units q4h, protonix 40mg  q24h IV drips: D5 and NaCl with KCl @75ml /hr  Labs: Cr 0.43, Phos 2.2 (L) MD to order repletion; CBG's 112-156 x24 hours  Diet Order:   Diet Order             Diet NPO time specified  Diet effective now                   EDUCATION NEEDS:   Education needs have been addressed  Skin:  Skin Assessment: Reviewed RN Assessment  Last BM:  6/2 (type 7 x3)  Height:   Ht Readings from Last 1 Encounters:  09/29/22 4\' 9"  (1.448 m)    Weight:   Wt Readings from Last 1 Encounters:  10/01/22 49 kg   BMI:  Body mass index is 23.37 kg/m.  Estimated Nutritional Needs:   Kcal:  1300-1500  Protein:  65-80g  Fluid:  1.3-1.5L  Drusilla Kanner, RDN, LDN Clinical Nutrition

## 2022-10-03 NOTE — Progress Notes (Signed)
Progress Note     Subjective: Unable to communicate effectively with patient at bedside  Objective: Vital signs in last 24 hours: Temp:  [98.1 F (36.7 C)-100.3 F (37.9 C)] 98.4 F (36.9 C) (06/03 0625) Pulse Rate:  [96-109] 98 (06/03 0625) Resp:  [14-21] 16 (06/03 0625) BP: (143-176)/(72-87) 154/83 (06/03 0313) SpO2:  [97 %-100 %] 97 % (06/03 0625) Last BM Date : 10/02/22  Intake/Output from previous day: 06/02 0701 - 06/03 0700 In: 885.8 [I.V.:579.9; IV Piggyback:305.9] Out: 400 [Urine:400] Intake/Output this shift: No intake/output data recorded.  PE: General: pleasant, WD, WN female who is laying in bed in NAD Heart: regular, rate, and rhythm.   Lungs: Respiratory effort nonlabored Abd: soft, NT, ND, +BS, NGT clamped   Lab Results:  Recent Labs    10/01/22 0044 10/02/22 0038  WBC 6.0 7.8  HGB 10.4* 10.3*  HCT 31.6* 32.3*  PLT 296 281    BMET Recent Labs    10/01/22 0044 10/02/22 0038  NA 136 140  K 3.7 3.2*  CL 98 101  CO2 29 28  GLUCOSE 105* 115*  BUN 26* 18  CREATININE 0.74 0.68  CALCIUM 8.8* 8.7*    PT/INR No results for input(s): "LABPROT", "INR" in the last 72 hours. CMP     Component Value Date/Time   NA 140 10/02/2022 0038   K 3.2 (L) 10/02/2022 0038   CL 101 10/02/2022 0038   CO2 28 10/02/2022 0038   GLUCOSE 115 (H) 10/02/2022 0038   BUN 18 10/02/2022 0038   CREATININE 0.68 10/02/2022 0038   CALCIUM 8.7 (L) 10/02/2022 0038   PROT 7.0 09/30/2022 0037   ALBUMIN 3.3 (L) 09/30/2022 0037   AST 25 09/30/2022 0037   ALT 19 09/30/2022 0037   ALKPHOS 65 09/30/2022 0037   BILITOT 0.7 09/30/2022 0037   GFRNONAA >60 10/02/2022 0038   GFRAA >60 07/08/2019 1947   Lipase  No results found for: "LIPASE"     Studies/Results: DG CHEST PORT 1 VIEW  Result Date: 10/03/2022 CLINICAL DATA:  [id number] Encounter for feeding tube placement [id number] EXAM: PORTABLE CHEST - 1 VIEW COMPARISON:  09/29/2022 FINDINGS: Nasogastric tube extends at  least as far as the stomach, tip not seen. Lungs are clear. Heart size normal Aortic Atherosclerosis (ICD10-170.0). No effusion. Left shoulder DJD IMPRESSION: 1. No acute cardiopulmonary disease. 2. Nasogastric tube at least as far as the stomach. Electronically Signed   By: Corlis Leak M.D.   On: 10/03/2022 07:53   DG Abd Portable 1V  Result Date: 10/02/2022 CLINICAL DATA:  Evaluate NG tube EXAM: PORTABLE ABDOMEN - 1 VIEW COMPARISON:  October 02, 2022 FINDINGS: The NG tube terminates in the right mid abdomen. The distal tip is flipped back on itself. No other abnormalities. IMPRESSION: The NG tube terminates in the right mid abdomen. The distal tip is flipped back on itself. Recommend repositioning. Electronically Signed   By: Gerome Sam III M.D.   On: 10/02/2022 13:26   DG Abd Portable 1V  Result Date: 10/02/2022 CLINICAL DATA:  Evaluate NG tube placement EXAM: PORTABLE ABDOMEN - 1 VIEW COMPARISON:  None Available. FINDINGS: The NG tube terminates in the stomach. IMPRESSION: The NG tube terminates in the stomach. Electronically Signed   By: Gerome Sam III M.D.   On: 10/02/2022 11:28   DG Abd Portable 1V-Small Bowel Obstruction Protocol-initial, 8 hr delay  Result Date: 10/02/2022 CLINICAL DATA:  Small bowel obstruction, 8 hour delay. EXAM: PORTABLE ABDOMEN - 1 VIEW COMPARISON:  Radiograph yesterday. FINDINGS: Administered enteric contrast is seen within the ascending, transverse, and descending colon to the level of the rectum. Mild persistent gaseous small bowel distension centrally. Enteric tube is looped in the stomach with tip directed towards the gastric cardia. IMPRESSION: Administered enteric contrast within the colon. Mild persistent gaseous distention of small bowel centrally. Electronically Signed   By: Narda Rutherford M.D.   On: 10/02/2022 04:00   DG Abd 1 View  Result Date: 10/01/2022 CLINICAL DATA:  Nasogastric tube placement. EXAM: ABDOMEN - 1 VIEW COMPARISON:  09/30/2022. FINDINGS:  Nasogastric tube passes well below the diaphragm, curling in the distal stomach, tip projecting in the fundus. Dilated small bowel is less prominent than on the previous day's study. IMPRESSION: 1. Well-positioned nasal/orogastric tube. Electronically Signed   By: Amie Portland M.D.   On: 10/01/2022 13:01    Anti-infectives: Anti-infectives (From admission, onward)    None        Assessment/Plan  Ileus vs SBO - HDS. Afebrile. No tachycardia or hypotension. WBC wnl. No peritonitis on exam - NGT placed and SBO protocol passed, many bowel movements since admission - Okay to remove NG tube from a surgical perspective, this does not appear to be a bowel obstruction - Suspect this is more ileus/motility problem - Keep K > 4 and Mg > 2 for bowel function - No surgical needs, surgery team will sign off, please call back if needed.  FEN: Okay for diet as tolerated from general surgery perspective, some aspiration concerns may provide other reasons for diet to be limited however VTE: LMWH ID: no current abx  LOS: 4 days   I reviewed hospitalist notes, last 24 h vitals and pain scores, last 48 h intake and output, last 24 h labs and trends, and last 24 h imaging results.     Quentin Ore, MD [facility] Surgery 10/03/2022, 8:58 AM Please see Amion for pager number during day hours 7:00am-4:30pm

## 2022-10-03 NOTE — Progress Notes (Signed)
Speech Language Pathology Treatment: Dysphagia  Patient Details Name: Brenda Riggs MRN: [id number] DOB: January 31, 1956 Today's Date: 10/03/2022 Time: 4098-1191 SLP Time Calculation (min) (ACUTE ONLY): 9 min  Assessment / Plan / Recommendation Clinical Impression  Visited pt to determine ability to participate in MBS. Pt able to follow commands, protrude tongue and lateralize, cough and swallow on command. She does appear to be struggling with swallowing against NG tube and this will need to be removed prior to MBS. Will discuss with RN and MD.    HPI HPI: 67 year old with prior history of ICH and poor baseline with dysarthria and weakness worse on the left than right at baseline presenting for evaluation of worsening slurred speech and worsening weakness.  MRI which does not show any evidence of an acute stroke.  Per MD, Has a leukocytosis-wonder if this is recrudescence of old symptoms in the setting of an underlying infection or recrudescence of stroke symptoms in the setting of acute illness-new onset A-fib. Most recent MBS complete 08/20/20 indicated a  severe oral and mild pharyngeal dysphagia mostly c/b decreased oral control from significant weakness, with recommendations for dysphagia 1 with thin liquid.  MBs also in 2021 with recommendations for dys 3, thin liquid.      SLP Plan  Continue with current plan of care;MBS      Recommendations for follow up therapy are one component of a multi-disciplinary discharge planning process, led by the attending physician.  Recommendations may be updated based on patient status, additional functional criteria and insurance authorization.    Recommendations  Diet recommendations: NPO                              Continue with current plan of care;MBS     Revanth Neidig, Riley Nearing  10/03/2022, 9:13 AM

## 2022-10-03 NOTE — Progress Notes (Signed)
Upon assessment, pt's having increased wetness to upper lungs and unable to cough up secretions.  This has gotten worse since tube feeding was increased at 0200 to 76ml/hr.  Dr. Joneen Roach notified and tube feeding stopped until further advisement from MD.  Pt also noted to have a slight fever, MD also notified of this.  Will continue to monitor.

## 2022-10-03 NOTE — Progress Notes (Signed)
Modified Barium Swallow Study  Patient Details  Name: Brenda Riggs MRN: [id number] Date of Birth: January 31, 1956  Today's Date: 10/03/2022  Modified Barium Swallow completed.  Full report located under Chart Review in the Imaging Section.  History of Present Illness 67 year old with prior history of ICH and poor baseline with dysarthria and weakness worse on the left than right at baseline presenting for evaluation of worsening slurred speech and worsening weakness.  MRI which does not show any evidence of an acute stroke.  Per MD, Has a leukocytosis-wonder if this is recrudescence of old symptoms in the setting of an underlying infection or recrudescence of stroke symptoms in the setting of acute illness-new onset A-fib. Most recent MBS complete 08/20/20 indicated a  severe oral and mild pharyngeal dysphagia mostly c/b decreased oral control from significant weakness, with recommendations for dysphagia 1 with thin liquid.  MBs also in 2021 with recommendations for dys 3, thin liquid.   Clinical Impression Pt demonstrates a severe oral dysphagia with very limited lingual function for bolus propulsion. When given teaspoons or straw sips pt has anterior spillage, lingual thrusting for propulsion with loss of bolus to the floor of mouth. Pt often spews liquid from mouth while attempting to manipulate. Pt uses a posterior head tilt to transit boluses. Thin, honey and puree all spill to pyriforms and airway prior to swallow initaition while pt still attempting to propel remainder of bolus. Aspiration is silent. Though pt does have involuntary movement of base of tongue and soft palate, at rest both appear flaccid and pt has snoring respirations. THere is a constant pulsing movement of all oropharyngeal musculature. Pharyngeal strength and ROM of motion quite good once swallow triggered. Unsure of etiology of impairment. There is a dramatic difference since last MBS in 2022. Unsure of potential for improvement,  would expect limited ability to participate in therapy. Factors that may increase risk of adverse event in presence of aspiration Brenda Riggs & Brenda Riggs 2021): Weak cough;Aspiration of thick, dense, and/or acidic materials;Frequent aspiration of large volumes;Limited mobility;Reduced cognitive function;Poor general health and/or compromised immunity;Frail or deconditioned  Swallow Evaluation Recommendations Recommendations: NPO;Alternative means of nutrition - NG Tube Medication Administration: Via alternative means Oral care recommendations: Oral care QID (4x/day) Recommended consults: Consider Palliative care      Brenda Riggs, Brenda Riggs 10/03/2022,10:46 AM

## 2022-10-03 NOTE — Significant Event (Signed)
Pt is experiencing auditory gurgling/and grunting sounds. Performed oral suctioning.

## 2022-10-04 DIAGNOSIS — Z8673 Personal history of transient ischemic attack (TIA), and cerebral infarction without residual deficits: Secondary | ICD-10-CM | POA: Diagnosis not present

## 2022-10-04 DIAGNOSIS — E440 Moderate protein-calorie malnutrition: Secondary | ICD-10-CM

## 2022-10-04 DIAGNOSIS — R1310 Dysphagia, unspecified: Secondary | ICD-10-CM | POA: Diagnosis not present

## 2022-10-04 DIAGNOSIS — R531 Weakness: Secondary | ICD-10-CM

## 2022-10-04 DIAGNOSIS — I480 Paroxysmal atrial fibrillation: Secondary | ICD-10-CM | POA: Diagnosis not present

## 2022-10-04 DIAGNOSIS — I4891 Unspecified atrial fibrillation: Secondary | ICD-10-CM | POA: Diagnosis not present

## 2022-10-04 LAB — CBC
HCT: 29.1 % — ABNORMAL LOW (ref 36.0–46.0)
Hemoglobin: 9.1 g/dL — ABNORMAL LOW (ref 12.0–15.0)
MCH: 30.6 pg (ref 26.0–34.0)
MCHC: 31.3 g/dL (ref 30.0–36.0)
MCV: 98 fL (ref 80.0–100.0)
Platelets: 216 10*3/uL (ref 150–400)
RBC: 2.97 MIL/uL — ABNORMAL LOW (ref 3.87–5.11)
RDW: 13.8 % (ref 11.5–15.5)
WBC: 6.4 10*3/uL (ref 4.0–10.5)
nRBC: 0 % (ref 0.0–0.2)

## 2022-10-04 LAB — GLUCOSE, CAPILLARY
Glucose-Capillary: 114 mg/dL — ABNORMAL HIGH (ref 70–99)
Glucose-Capillary: 122 mg/dL — ABNORMAL HIGH (ref 70–99)
Glucose-Capillary: 132 mg/dL — ABNORMAL HIGH (ref 70–99)
Glucose-Capillary: 136 mg/dL — ABNORMAL HIGH (ref 70–99)
Glucose-Capillary: 145 mg/dL — ABNORMAL HIGH (ref 70–99)
Glucose-Capillary: 94 mg/dL (ref 70–99)

## 2022-10-04 LAB — MAGNESIUM
Magnesium: 1.9 mg/dL (ref 1.7–2.4)
Magnesium: 1.8 mg/dL (ref 1.7–2.4)

## 2022-10-04 LAB — PHOSPHORUS
Phosphorus: 2.9 mg/dL (ref 2.5–4.6)
Phosphorus: 2 mg/dL — ABNORMAL LOW (ref 2.5–4.6)

## 2022-10-04 LAB — BASIC METABOLIC PANEL
Anion gap: 11 (ref 5–15)
BUN: 8 mg/dL (ref 8–23)
CO2: 23 mmol/L (ref 22–32)
Calcium: 8.3 mg/dL — ABNORMAL LOW (ref 8.9–10.3)
Chloride: 103 mmol/L (ref 98–111)
Creatinine, Ser: 0.49 mg/dL (ref 0.44–1.00)
GFR, Estimated: >60 mL/min (ref >60)
Glucose, Bld: 132 mg/dL — ABNORMAL HIGH (ref 70–99)
Potassium: 3.8 mmol/L (ref 3.5–5.1)
Sodium: 137 mmol/L (ref 135–145)

## 2022-10-04 MED ORDER — GUAIFENESIN 100 MG/5ML PO LIQD
5.0000 mL | ORAL | Status: DC | PRN
  Administered 2022-10-04: 5 mL via ORAL
  Filled 2022-10-04: qty 10

## 2022-10-04 NOTE — Progress Notes (Signed)
   10/04/22 0100  BiPAP/CPAP/SIPAP  Reason BIPAP/CPAP not in use Non-compliant

## 2022-10-04 NOTE — Hospital Course (Addendum)
13 yof w/ multiple medical comorbidities including asthma, diabetes mellitus type 2, hypertension, hyperlipidemia, ICH with residual dysarthria presented for worsening weakness and worsening speech difficulty on 09/28/22  In the ED:A-fib with RVR with HR 151.MRI brain - limited study but negative for acute intracranial abnormality. Neurology was consulted, felt likely recrudescence of old stroke symptoms in setting of underlying systemic infection. Possible small stroke in setting of A-fib. No anticoagulation recommended due to extremely high risk of hemorrhage in the brain because of presence of chronic microhemorrhages and prior ICH as per neurology.  Patient was admitted Managed with Cardizem drip for new onset A-fib.Echo 5/30:EF 65 to 70%, no RWMA moderate concentric LVH 5/30>some vomiting and no bowel movement since admission, x-ray abdomen multiple dilated loops of small bowel with decompressed colon possible SBO or ileus> General surgery was consulted> SBO resolved surgery signed off SLP following> recommended n.p.o. and alternative means of nutrition intervention core track tube feeding.  Palliative care following for goals of care discussion/PEG tube feeding

## 2022-10-04 NOTE — Progress Notes (Signed)
PROGRESS NOTE Brenda Riggs  ZOX:[id number] DOB: 1955/05/28 DOA: 09/28/2022 PCP: [name], MD  Brief Narrative/Hospital Course: 67 yof w/ multiple medical comorbidities including asthma, diabetes mellitus type 2, hypertension, hyperlipidemia, ICH with residual dysarthria presented for worsening weakness and worsening speech difficulty on 09/28/22  In the ED:A-fib with RVR with HR 151.MRI brain - limited study but negative for acute intracranial abnormality. Neurology was consulted, felt likely recrudescence of old stroke symptoms in setting of underlying systemic infection. Possible small stroke in setting of A-fib. No anticoagulation recommended due to extremely high risk of hemorrhage in the brain because of presence of chronic microhemorrhages and prior ICH as per neurology.  Patient was admitted Managed with Cardizem drip for new onset A-fib.  Echo 5/30:EF 65 to 70%, no RWMA moderate concentric LVH  On 5/30 had some vomiting and no bowel movement since admission, x-ray abdomen multiple dilated loops of small bowel with decompressed colon possible SBO or ileus> General surgery was consulted. Seen by SLP> recommended n.p.o. and alternative means of nutrition, NG tube as failed swallow eval.     Subjective: Seen and examined Alert awake,speech difficult to understand cortrak+ Overnight heart rate is stable, afebrile on room air Labs reviewed from 6/3 improved mag and Phos. Able to move extremities some but very weak more weak on left side No nausea, says passing flatus  Assessment and Plan: Principal Problem:   Atrial fibrillation with RVR (HCC) Active Problems:   Type II diabetes mellitus (HCC)   Essential hypertension, benign   Asthma   Hypokalemia   History of stroke   Atrial fibrillation (HCC)   Malnutrition of moderate degree   Ileus versus SBO: General surgery following.  No surgical need per surgery okay for diet as level swallowing improves.  Last BM 6/3.   Continue IV fluid hydration.  Severe oral dysphagia Moderate malnutrition: Currently on core track tube feeding, speech following closely.  Continue IV hydration. RD following  New onset PAF: Converted to NSR Cardizem drip discontinued 6/1.  No anticoagulation recommended due to risk of intracranial hemorrhage, echo with normal EF TSH stable.  Cardiology consult if recurrence of A-fib  Worsening weakness and dysarthria  History of stroke: Seen by neurology MRI brain unremarkable felt to be a recrudescence of old stroke symptoms.  Hypokalemia/hypophosphatemia/hypomagnesemia: resolved: Recent Labs  Lab 09/30/22 0037 10/01/22 0043 10/01/22 0044 10/02/22 0038 10/02/22 1629 10/03/22 0820 10/03/22 1747 10/04/22 0740  K 4.8  --  3.7 3.2*  --  3.7  --  3.8  CALCIUM 9.2  --  8.8* 8.7*  --  8.6*  --  8.3*  MG 2.4   < >  --  2.1 2.1 1.9 2.0 1.9  PHOS  --    < >  --  3.2 2.1* 2.2* 2.8 2.9   < > = values in this interval not displayed.    Type 2 diabetes mellitus blood sugar controlled on SSI Recent Labs  Lab 10/03/22 1138 10/03/22 1612 10/03/22 2036 10/04/22 0004 10/04/22 0406  GLUCAP 117* 140* 117* 132* 136*    Leukocytosis improved without antibiotics Recent Labs  Lab 09/30/22 0037 10/01/22 0044 10/02/22 0038 10/03/22 0820 10/04/22 0740  WBC 5.7 6.0 7.8 8.1 6.4     Acute kidney injury improved, continue IVF Recent Labs    09/28/22 2337 09/28/22 2342 09/29/22 0550 09/30/22 0037 10/01/22 0044 10/02/22 0038 10/03/22 0820 10/04/22 0740  BUN 15 16 12  24* 26* 18 9 8   CREATININE 0.56 0.40* 0.62 1.21* 0.74 0.68  0.43* 0.49  CO2 28  --  27 29 29 28 27 23     OSA continue nightly CPAP  IDA: iron low stable b12,fa- cont iv idron x 5 days Recent Labs  Lab 09/30/22 0037 10/01/22 0044 10/02/22 0038 10/03/22 0820 10/04/22 0740  HGB 11.0* 10.4* 10.3* 9.8* 9.1*  HCT 33.1* 31.6* 32.3* 30.4* 29.1*    evere dysphagia complex co-morbidities-PMT consul for GOC  DVT  prophylaxis: enoxaparin (LOVENOX) injection 40 mg Start: 09/29/22 1000 Code Status:   Code Status: Full Code Family Communication: plan of care discussed with patient at bedside. Patient status is:  inpatient  because of  dysphagia Level of care: Progressive   Dispo: The patient is from:snf            Anticipated disposition: tbd Objective: Vitals last 24 hrs: Vitals:   10/03/22 1925 10/04/22 0006 10/04/22 0410 10/04/22 0724  BP: (!) 157/66 (!) 151/55 (!) 149/67 (!) 149/74  Pulse: 84 80 84 86  Resp: 18 14 20 16   Temp: 98.5 F (36.9 C) 98 F (36.7 C) 99.2 F (37.3 C) 98.5 F (36.9 C)  TempSrc: Oral Oral Oral Oral  SpO2: 100% 99% 97% 99%  Weight:  54.4 kg    Height:       Weight change:   Physical Examination: General exam: alert awake, dysarthria HEENT:Oral mucosa moist, Ear/Nose WNL grossly Respiratory system: bilaterally clear BS, no use of accessory muscle Cardiovascular system: S1 & S2 +, No JVD. Gastrointestinal system: Abdomen soft,NT,ND, BS+ Nervous System:Alert, awake, moving extremities some but weak all over more on Left. Extremities: LE edema neg,distal peripheral pulses palpable.  Skin: No rashes,no icterus. MSK: Normal muscle bulk,tone, power Ngt+  Medications reviewed:  Scheduled Meds:  enoxaparin (LOVENOX) injection  40 mg Subcutaneous Daily   feeding supplement (PROSource TF20)  60 mL Per Tube Daily   free water  110 mL Per Tube Q4H   insulin aspart  0-6 Units Subcutaneous Q4H   [START ON 10/07/2022] iron polysaccharides  150 mg Oral Daily   mouth rinse  15 mL Mouth Rinse 4 times per day   pantoprazole (PROTONIX) IV  40 mg Intravenous Q24H   sodium chloride flush  3 mL Intravenous Q12H   Continuous Infusions:  dextrose 5 % and 0.9 % NaCl with KCl 20 mEq/L 75 mL/hr at 10/04/22 0100   feeding supplement (OSMOLITE 1.5 CAL) 1,000 mL (10/04/22 0726)   iron sucrose 200 mg (10/03/22 0859)   Diet Order             Diet NPO time specified  Diet effective  now                 No intake or output data in the 24 hours ending 10/04/22 0804 Net IO Since Admission: 546.49 mL [10/04/22 0804]  Wt Readings from Last 3 Encounters:  10/04/22 54.4 kg  08/18/20 54 kg  03/28/17 61.2 kg     Unresulted Labs (From admission, onward)     Start     Ordered   10/06/22 0500  Creatinine, serum  (enoxaparin (LOVENOX)    CrCl >/= 30 ml/min)  Weekly,   R     Comments: while on enoxaparin therapy    09/29/22 0237   10/04/22 0500  CBC  Daily,   R     Question:  Specimen collection method  Answer:  Lab=Lab collect   10/03/22 0843   10/04/22 0500  Basic metabolic panel  Daily,   R  Question:  Specimen collection method  Answer:  Lab=Lab collect   10/03/22 0843   10/03/22 1700  Magnesium  (ICU Tube Feeding: PEPuP )  5A & 5P,   R (with TIMED occurrences)     Question:  Specimen collection method  Answer:  Lab=Lab collect   10/03/22 1431   10/03/22 1700  Phosphorus  (ICU Tube Feeding: PEPuP )  5A & 5P,   R (with TIMED occurrences)     Question:  Specimen collection method  Answer:  Lab=Lab collect   10/03/22 1431   10/02/22 0500  Basic metabolic panel  Daily,   R     Question:  Specimen collection method  Answer:  Lab=Lab collect   10/01/22 1006   10/02/22 0500  CBC  Daily,   R     Question:  Specimen collection method  Answer:  Lab=Lab collect   10/01/22 1006   10/02/22 0500  Magnesium  Daily,   R     Question:  Specimen collection method  Answer:  Lab=Lab collect   10/01/22 1006   10/02/22 0500  Phosphorus  Daily,   R     Question:  Specimen collection method  Answer:  Lab=Lab collect   10/01/22 1006   09/28/22 2334  Urine rapid drug screen (hosp performed)  Once,   R        09/28/22 2334   09/28/22 2334  Urinalysis, Routine w reflex microscopic -Urine, Clean Catch  Once,   R       Question:  Specimen Source  Answer:  Urine, Clean Catch   09/28/22 2334          Data Reviewed: I have personally reviewed following labs and imaging  studies CBC: Recent Labs  Lab 09/28/22 2337 09/28/22 2342 09/29/22 0550 09/30/22 0037 10/01/22 0044 10/02/22 0038 10/03/22 0820  WBC 18.3*  --  13.8* 5.7 6.0 7.8 8.1  NEUTROABS 15.3*  --   --   --   --   --   --   HGB 12.5   < > 13.0 11.0* 10.4* 10.3* 9.8*  HCT 38.4   < > 38.9 33.1* 31.6* 32.3* 30.4*  MCV 96.7  --  90.0 90.4 92.4 93.1 93.5  PLT 259  --  357 349 296 281 267   < > = values in this interval not displayed.   Basic Metabolic Panel: Recent Labs  Lab 09/29/22 0550 09/30/22 0037 10/01/22 0043 10/01/22 0044 10/02/22 0038 10/02/22 1629 10/03/22 0820 10/03/22 1747  NA 136 137  --  136 140  --  139  --   K 4.2 4.8  --  3.7 3.2*  --  3.7  --   CL 96* 97*  --  98 101  --  106  --   CO2 27 29  --  29 28  --  27  --   GLUCOSE 155* 139*  --  105* 115*  --  121*  --   BUN 12 24*  --  26* 18  --  9  --   CREATININE 0.62 1.21*  --  0.74 0.68  --  0.43*  --   CALCIUM 9.7 9.2  --  8.8* 8.7*  --  8.6*  --   MG 1.6* 2.4 2.3  --  2.1 2.1 1.9 2.0  PHOS  --   --  4.4  --  3.2 2.1* 2.2* 2.8   GFR: Estimated Creatinine Clearance: 49.7 mL/min (A) (by C-G formula based on SCr  of 0.43 mg/dL (L)). Liver Function Tests: Recent Labs  Lab 09/28/22 2337 09/30/22 0037  AST 28 25  ALT 23 19  ALKPHOS 71 65  BILITOT 0.6 0.7  PROT 7.7 7.0  ALBUMIN 3.8 3.3*   Recent Labs  Lab 09/28/22 2337  INR 1.0   Recent Labs  Lab 10/03/22 1138 10/03/22 1612 10/03/22 2036 10/04/22 0004 10/04/22 0406  GLUCAP 117* 140* 117* 132* 136*   Recent Results (from the past 240 hour(s))  MRSA Next Gen by PCR, Nasal     Status: None   Collection Time: 09/29/22  4:39 AM   Specimen: Nasal Mucosa; Nasal Swab  Result Value Ref Range Status   MRSA by PCR Next Gen NOT DETECTED NOT DETECTED Final    Comment: (NOTE) The GeneXpert MRSA Assay (FDA approved for NASAL specimens only), is one component of a comprehensive MRSA colonization surveillance program. It is not intended to diagnose MRSA  infection nor to guide or monitor treatment for MRSA infections. Test performance is not FDA approved in patients less than 60 years old. Performed at [facility], [address], [place], [place] [address]     Antimicrobials: Anti-infectives (From admission, onward)    None      Culture/Microbiology    Component Value Date/Time   SDES  08/15/2020 1610    BLOOD RIGHT ARM Performed at [facility], [address], [place], [place] [address]    SPECREQUEST  08/15/2020 1610    BOTTLES DRAWN AEROBIC AND ANAEROBIC Blood Culture results may not be optimal due to an inadequate volume of blood received in culture bottles Performed at [facility], [address] [place], [place] [address]    CULT  08/15/2020 1610    NO GROWTH 5 DAYS Performed at [facility], [address] [place], [place] [address]    REPTSTATUS 08/20/2020 FINAL 08/15/2020 1610  Radiology Studies: DG Abd Portable 1V  Result Date: 10/03/2022 CLINICAL DATA:  Encounter for feeding tube placement. EXAM: PORTABLE ABDOMEN - 1 VIEW COMPARISON:  10/02/2022 FINDINGS: Feeding tube in the upper abdomen with the tip just right of midline. The tip is likely in the distal stomach near the duodenal bulb. There is oral contrast in the colon and a small amount of contrast within the stomach. Gaseous distention of bowel loops throughout the abdomen. IMPRESSION: 1. Feeding tube tip is likely in the distal stomach near the duodenal bulb. 2. Gaseous distention of bowel loops throughout the abdomen. Electronically Signed   By: Richarda Overlie M.D.   On: 10/03/2022 12:38   DG Swallowing Func-Speech Pathology  Result Date: 10/03/2022 Table formatting from the original result was not included. Modified Barium Swallow Study Patient Details Name: Brenda Riggs MRN: [id number] Date of Birth: 1956-04-13 Today's Date: 10/03/2022 HPI/PMH: HPI: 67 year old with prior history of ICH and poor baseline with dysarthria and  weakness worse on the left than right at baseline presenting for evaluation of worsening slurred speech and worsening weakness.  MRI which does not show any evidence of an acute stroke.  Per MD, Has a leukocytosis-wonder if this is recrudescence of old symptoms in the setting of an underlying infection or recrudescence of stroke symptoms in the setting of acute illness-new onset A-fib. Most recent MBS complete 08/20/20 indicated a  severe oral and mild pharyngeal dysphagia mostly c/b decreased oral control from significant weakness, with recommendations for dysphagia 1 with thin liquid.  MBs also in 2021 with recommendations for dys  3, thin liquid. Clinical Impression: Clinical Impression: Pt demonstrates a severe oral dysphagia with very limited lingual function for bolus propulsion. When given teaspoons or straw sips pt has anterior spillage, lingual thrusting for propulsion with loss of bolus to the floor of mouth. Pt often spews liquid from mouth while attempting to manipulate. Pt uses a posterior head tilt to transit boluses. Thin, honey and puree all spill to pyriforms and airway prior to swallow initaition while pt still attempting to propel remainder of bolus. Aspiration is silent. Though pt does have involuntary movement of base of tongue and soft palate, at rest both appear flaccid and pt has snoring respirations. THere is a constant pulsing movement of all oropharyngeal musculature. Pharyngeal strength and ROM of motion quite good once swallow triggered. Unsure of etiology of impairment. There is a dramatic difference since last MBS in 2022. Unsure of potential for improvement, would expect limited ability to participate in therapy. Factors that may increase risk of adverse event in presence of aspiration [name] & [name] 2021): Factors that may increase risk of adverse event in presence of aspiration [name] & [name] 2021): Weak cough; Aspiration of thick, dense, and/or acidic materials; Frequent  aspiration of large volumes; Limited mobility; Reduced cognitive function; Poor general health and/or compromised immunity; Frail or deconditioned Recommendations/Plan: Swallowing Evaluation Recommendations Swallowing Evaluation Recommendations Recommendations: NPO; Alternative means of nutrition - NG Tube Medication Administration: Via alternative means Oral care recommendations: Oral care QID (4x/day) Recommended consults: Consider Palliative care Treatment Plan Treatment Plan Treatment recommendations: Therapy as outlined in treatment plan below Follow-up recommendations: Skilled nursing-short term rehab (<3 hours/day) Functional status assessment: Patient has had a recent decline in their functional status and/or demonstrates limited ability to make significant improvements in function in a reasonable and predictable amount of time. Treatment frequency: Min 2x/week Treatment duration: 2 weeks Interventions: Patient/family education; Oropharyngeal exercises; Aspiration precaution training; Trials of upgraded texture/liquids Recommendations Recommendations for follow up therapy are one component of a multi-disciplinary discharge planning process, led by the attending physician.  Recommendations may be updated based on patient status, additional functional criteria and insurance authorization. Assessment: Orofacial Exam: Orofacial Exam Oral Cavity - Dentition: Adequate natural dentition Anatomy: No data recorded Boluses Administered: Boluses Administered Boluses Administered: Thin liquids (Level 0); Moderately thick liquids (Level 3, honey thick); Puree  Oral Impairment Domain: Oral Impairment Domain Lip Closure: Escape beyond mid-chin Tongue control during bolus hold: Posterior escape of greater than half of bolus Bolus preparation/mastication: -- (NT) Bolus transport/lingual motion: Minimal-no tongue motion Oral residue: Majority of bolus remaining Location of oral residue : Floor of mouth; Lateral sulci  Initiation of pharyngeal swallow : Pyriform sinuses (airway)  Pharyngeal Impairment Domain: Pharyngeal Impairment Domain Soft palate elevation: No bolus between soft palate (SP)/pharyngeal wall (PW) Laryngeal elevation: Complete superior movement of thyroid cartilage with complete approximation of arytenoids to epiglottic petiole Anterior hyoid excursion: Partial anterior movement Epiglottic movement: Partial inversion Laryngeal vestibule closure: Complete, no air/contrast in laryngeal vestibule Pharyngeal stripping wave : Present - complete Pharyngeal contraction (A/P view only): N/A Pharyngoesophageal segment opening: Complete distension and complete duration, no obstruction of flow Tongue base retraction: No contrast between tongue base and posterior pharyngeal wall (PPW) Pharyngeal residue: Trace residue within or on pharyngeal structures  Esophageal Impairment Domain: No data recorded Pill: No data recorded Penetration/Aspiration Scale Score: Penetration/Aspiration Scale Score 8.  Material enters airway, passes BELOW cords without attempt by patient to eject out (silent aspiration) : Thin liquids (Level 0); Moderately thick liquids (Level 3,  honey thick); Puree Compensatory Strategies: Compensatory Strategies Compensatory strategies: No   General Information: Caregiver present: No  Diet Prior to this Study: NPO   Temperature : Normal   No data recorded  Supplemental O2: None (Room air)   History of Recent Intubation: No  Behavior/Cognition: Alert; Cooperative Self-Feeding Abilities: Dependent for feeding Baseline vocal quality/speech: Abnormal resonance; Dysphonic Volitional Cough: Able to elicit Volitional Swallow: Able to elicit No data recorded Goal Planning: Prognosis for improved oropharyngeal function: Guarded Barriers to Reach Goals: Cognitive deficits; Time post onset; Severity of deficits; Behavior; Overall medical prognosis No data recorded No data recorded Consulted and agree with results and  recommendations: Pt unable/family or caregiver not available; Physician; Nurse Pain: Pain Assessment Pain Assessment: No/denies pain Faces Pain Scale: 0 Breathing: 0 Negative Vocalization: 0 Facial Expression: 0 Body Language: 0 Consolability: 0 PAINAD Score: 0 End of Session: Start Time:SLP Start Time (ACUTE ONLY): 0940 Stop Time: SLP Stop Time (ACUTE ONLY): 1000 Time Calculation:SLP Time Calculation (min) (ACUTE ONLY): 20 min Charges: SLP Evaluations $ SLP Speech Visit: 1 Visit SLP Evaluations $BSS Swallow: 1 Procedure $MBS Swallow: 1 Procedure $Swallowing Treatment: 1 Procedure SLP visit diagnosis: SLP Visit Diagnosis: Dysphagia, oral phase (R13.11); Dysphagia, oropharyngeal phase (R13.12) Past Medical History: Past Medical History: Diagnosis Date  Allergy   Asthma   Blood transfusion without reported diagnosis   Chronic kidney disease   COPD (chronic obstructive pulmonary disease) (HCC)   Depression   Diabetes mellitus without complication (HCC)   Hyperlipidemia   Hypertension   Stroke [facility])  Past Surgical History: Past Surgical History: Procedure Laterality Date  ABDOMINAL HYSTERECTOMY    APPENDECTOMY    CESAREAN SECTION    TUBAL LIGATION   DeBlois, Riley Nearing 10/03/2022, 10:48 AM  DG CHEST PORT 1 VIEW  Result Date: 10/03/2022 CLINICAL DATA:  [id number] Encounter for feeding tube placement [id number] EXAM: PORTABLE CHEST - 1 VIEW COMPARISON:  09/29/2022 FINDINGS: Nasogastric tube extends at least as far as the stomach, tip not seen. Lungs are clear. Heart size normal Aortic Atherosclerosis (ICD10-170.0). No effusion. Left shoulder DJD IMPRESSION: 1. No acute cardiopulmonary disease. 2. Nasogastric tube at least as far as the stomach. Electronically Signed   By: Corlis Leak M.D.   On: 10/03/2022 07:53   DG Abd Portable 1V  Result Date: 10/02/2022 CLINICAL DATA:  Evaluate NG tube EXAM: PORTABLE ABDOMEN - 1 VIEW COMPARISON:  October 02, 2022 FINDINGS: The NG tube terminates in the right mid abdomen. The distal tip is  flipped back on itself. No other abnormalities. IMPRESSION: The NG tube terminates in the right mid abdomen. The distal tip is flipped back on itself. Recommend repositioning. Electronically Signed   By: Gerome Sam III M.D.   On: 10/02/2022 13:26   DG Abd Portable 1V  Result Date: 10/02/2022 CLINICAL DATA:  Evaluate NG tube placement EXAM: PORTABLE ABDOMEN - 1 VIEW COMPARISON:  None Available. FINDINGS: The NG tube terminates in the stomach. IMPRESSION: The NG tube terminates in the stomach. Electronically Signed   By: Gerome Sam III M.D.   On: 10/02/2022 11:28     LOS: 5 days   [name], MD [facility]  10/04/2022, 8:04 AM

## 2022-10-04 NOTE — Consult Note (Signed)
Consultation Note Date: 10/04/2022   Patient Name: Brenda Riggs  DOB: 1956-05-02  MRN: [id number]  Age / Sex: 67 y.o., female  PCP: [name], MD Referring Physician: [name], MD  Reason for Consultation: Establishing goals of care  HPI/Patient Profile: 67 y.o. female   admitted on 09/28/2022 with past   medical history significant for asthma, type 2 diabetes mellitus, hypertension, hyperlipidemia, and ICH with residual dysarthria who presents for evaluation of worsened weakness and worsened speech difficulty.   Per report of the patient's facility, according to social work note she is a resident of [facility]. Facility transferred patient to the emergency room with increasing weakness.  Family report continued physical and functional decline over the past many months.  Patient requires assistance with ADLs, is essentially bedbound, and has chronic dysarthria, dysphagia, and weakness.   ED Course: Upon arrival to the ED, patient is found to be afebrile and saturating well on room air with elevated heart rate and stable blood pressure.  EKG demonstrates atrial fibrillation with rate 151 and repolarization abnormality.  No acute findings are noted on chest x-ray.  MRI brain is a limited study but negative for acute intracranial abnormality.  Labs are most notable for potassium 2.8 and WBC 18,300.   Today is day 5 of this hospitalization.  Family face treatment option decisions, advanced directive decisions and anticipatory care needs.    Clinical Assessment and Goals of Care:  This NP Lorinda Creed reviewed medical records, received report from team, assessed the patient and then meet at the patient's bedside  to discuss diagnosis, prognosis, GOC, EOL wishes disposition and options.  Patient with severe aphasia, unable to participate in discussion regarding goals of care  I spoke to  daughter/ Brenda Riggs by telephone    Concept of Palliative Care was introduced as specialized medical care for people and their families living with serious illness.  If focuses on providing relief from the symptoms and stress of a serious illness.  The goal is to improve quality of life for both the patient and the family.  Values and goals of care important to patient and family were attempted to be elicited.   Family verbalized an understanding of the importance of oversedation around advance care planning.  Family meeting is set for Thursday 10-06-22 at and 10:15  A  discussion was had today regarding advanced directives.  Concepts specific to code status, artifical feeding and hydration, continued IV antibiotics and rehospitalization was had.    Many outstanding difficult decisions pending, specifically desire for artificial feeding and hydration needs to be addressed.   The difference between a aggressive medical intervention path  and a palliative comfort care path for this patient at this time was had.      Questions and concerns addressed.  Patient  encouraged to call with questions or concerns.     PMT will continue to support holistically.         No documented HPOA or ACP docuemtns noted in [place]  SUMMARY OF RECOMMENDATIONS    Code Status/Advance Care Planning: Full code    Palliative Prophylaxis:  Aspiration, Delirium Protocol, Frequent Pain Assessment, and Oral Care  Additional Recommendations (Limitations, Scope, Preferences): Full Scope Treatment  Psycho-social/Spiritual:  Desire for further Chaplaincy support:no Emotional support offered  Prognosis:  Unable to determine-will be affected by decision regarding life prolonging measures  Discharge Planning: To Be Determined      Primary Diagnoses: Present on Admission:  Atrial fibrillation with RVR (HCC)  Hypokalemia  Essential hypertension, benign  Asthma  Atrial fibrillation (HCC)   I have  reviewed the medical record, interviewed the patient and family, and examined the patient. The following aspects are pertinent.  Past Medical History:  Diagnosis Date   Allergy    Asthma    Blood transfusion without reported diagnosis    Chronic kidney disease    COPD (chronic obstructive pulmonary disease) (HCC)    Depression    Diabetes mellitus without complication (HCC)    Hyperlipidemia    Hypertension    Stroke [facility])    Social History   Socioeconomic History   Marital status: Single    Spouse name: Not on file   Number of children: Not on file   Years of education: Not on file   Highest education level: Not on file  Occupational History   Not on file  Tobacco Use   Smoking status: Some Days    Packs/day: 0.10    Years: 41.00    Additional pack years: 0.00    Total pack years: 4.10    Types: Cigarettes   Smokeless tobacco: Never  Vaping Use   Vaping Use: Never used  Substance and Sexual Activity   Alcohol use: No    Alcohol/week: 0.0 standard drinks of alcohol   Drug use: No   Sexual activity: Not on file  Other Topics Concern   Not on file  Social History Narrative   Not on file   Social Determinants of Health   Financial Resource Strain: Not on file  Food Insecurity: Not on file  Transportation Needs: Not on file  Physical Activity: Not on file  Stress: Not on file  Social Connections: Not on file   Family History  Problem Relation Age of Onset   Heart disease Mother    Scheduled Meds:  enoxaparin (LOVENOX) injection  40 mg Subcutaneous Daily   feeding supplement (PROSource TF20)  60 mL Per Tube Daily   free water  110 mL Per Tube Q4H   insulin aspart  0-6 Units Subcutaneous Q4H   [START ON 10/07/2022] iron polysaccharides  150 mg Oral Daily   mouth rinse  15 mL Mouth Rinse 4 times per day   pantoprazole (PROTONIX) IV  40 mg Intravenous Q24H   sodium chloride flush  3 mL Intravenous Q12H   Continuous Infusions:  dextrose 5 % and 0.9 % NaCl  with KCl 20 mEq/L 75 mL/hr at 10/04/22 0100   feeding supplement (OSMOLITE 1.5 CAL) 1,000 mL (10/04/22 0726)   iron sucrose 200 mg (10/04/22 0821)   PRN Meds:.acetaminophen **OR** acetaminophen, ondansetron **OR** ondansetron (ZOFRAN) IV, mouth rinse, mouth rinse Medications Prior to Admission:  Prior to Admission medications   Medication Sig Start Date End Date Taking? Authorizing Provider  acetaminophen (TYLENOL) 325 MG tablet Take 650 mg by mouth every 6 (six) hours as needed for headache (minor discomfort/fever up to 100 degrees orally).     [provider]  albuterol (PROVENTIL HFA;VENTOLIN HFA) 108 (90 BASE)  MCG/ACT inhaler Inhale 2 puffs into the lungs 4 (four) times daily as needed for wheezing or shortness of breath.     [provider]  alum & mag hydroxide-simeth (MAALOX/MYLANTA) 200-200-20 MG/5ML suspension Take 30 mLs by mouth once as needed for indigestion or heartburn.    [provider]  amLODipine (NORVASC) 5 MG tablet Take 1 tablet (5 mg total) by mouth daily. 03/26/20   Layne Benton, NP  atorvastatin (LIPITOR) 40 MG tablet Take 1 tablet (40 mg total) by mouth daily at 6 PM. 03/25/20   Layne Benton, NP  beclomethasone (QVAR REDIHALER) 40 MCG/ACT inhaler Inhale 2 puffs into the lungs 2 (two) times daily.    [provider]  cholecalciferol (VITAMIN D3) 25 MCG (1000 UNIT) tablet Take 1,000 Units by mouth daily.    [provider]  divalproex (DEPAKOTE ER) 250 MG 24 hr tablet Take 250 mg by mouth at bedtime.    [provider]  fluticasone (FLONASE) 50 MCG/ACT nasal spray Place 1 spray into both nostrils daily.    [provider]  guaifenesin (ROBITUSSIN) 100 MG/5ML syrup Take 200 mg by mouth every 6 (six) hours as needed for cough.    [provider]  loperamide (IMODIUM A-D) 2 MG tablet Take 2 mg by mouth 4 (four) times daily as needed for diarrhea or loose stools.     [provider]  magnesium  hydroxide (MILK OF MAGNESIA) 400 MG/5ML suspension Take 22.5 mLs by mouth every 8 (eight) hours as needed (constipation (if no relief in 8 hours give with 6 oz prune juice)).     [provider]  metFORMIN (GLUCOPHAGE) 500 MG tablet Take 500 mg by mouth every 12 (twelve) hours.     [provider]  metoprolol tartrate (LOPRESSOR) 50 MG tablet Take 1 tablet (50 mg total) by mouth 2 (two) times daily. 03/25/20   Layne Benton, NP  Nutritional Supplements (NUTRITIONAL SUPPLEMENT PO) Take 1 each by mouth 3 (three) times daily with meals.    [provider]  Pollen Extracts (PROSTAT PO) Take 30 Units by mouth in the morning and at bedtime.    [provider]  Sodium Phosphates (FLEET ENEMA RE) Place 1 enema rectally daily as needed (constipation not relieved by MOM and prune juice).    [provider]  vitamin C (ASCORBIC ACID) 500 MG tablet Take 500 mg by mouth daily.    [provider]   Allergies  Allergen Reactions   Penicillins Other (See Comments)    Per caregiver. Unknown reaction Did it involve swelling of the face/tongue/throat, SOB, or low BP? Unknown Did it involve sudden or severe rash/hives, skin peeling, or any reaction on the inside of your mouth or nose? Unknown Did you need to seek medical attention at a hospital or doctor's office? Unknown When did it last happen?    unknown   If all above answers are "NO", may proceed with cephalosporin use.   Thioridazine Hcl Other (See Comments)    Present in Epic; not noted on MAR   Review of Systems  Unable to perform ROS: Patient nonverbal  Respiratory:  Positive for chest tightness.     Physical Exam HENT:     Head:     Comments: Noted cor-track  Cardiovascular:     Rate and Rhythm: Normal rate.  Pulmonary:     Effort: Pulmonary effort is normal.  Skin:    General: Skin is warm and dry.  Neurological:     Mental Status: She is alert.     Vital Signs: BP (!) 149/74 (BP  Location: Left Arm)   Pulse 86   Temp 98.5 F (36.9 C) (Oral)   Resp 16   Ht 4\' 9"  (1.448 m)   Wt 54.4 kg   SpO2 99%   BMI 25.97 kg/m  Pain Scale: Faces   Pain Score: 0-No pain   SpO2: SpO2: 99 % O2 Device:SpO2: 99 % O2 Flow Rate: .   IO: Intake/output summary: No intake or output data in the 24 hours ending 10/04/22 1045  LBM: Last BM Date : 10/02/22 Baseline Weight: Weight: 52 kg Most recent weight: Weight: 54.4 kg     Palliative Assessment/Data:  40 % at best   Time 75 miniutes  Signed by: Lorinda Creed, NP   Please contact Palliative Medicine Team phone at [phone] for questions and concerns.  For individual provider: See Loretha Stapler

## 2022-10-04 NOTE — Progress Notes (Signed)
   10/04/22 2321  BiPAP/CPAP/SIPAP  Reason BIPAP/CPAP not in use Non-compliant

## 2022-10-04 NOTE — Progress Notes (Signed)
Occupational Therapy Treatment Patient Details Name: Brenda Riggs MRN: [id number] DOB: 1955-12-02 Today's Date: 10/04/2022   History of present illness Pt is a 67 y/o F presenting to ED from [facility] with worsening weakness and speech difficulty. MRI brain negative for acute intracranial abnormaility. Neuro consulted and felt recredescence of old stroke symptoms in the setting of underlying systemic infection. Admitted for A fib with RVR. PMH includes asthma, DM2, HTN, HLD, ICH with residual dysarthria   OT comments  Patient received in bed and assisted in bed positioning and HOB raised to 45 degrees. Grooming addressed with washing face with supervision and brushing teeth with assistance for toothpaste and managing spit basin. Self feeding simulated with and without built up utensil with patient able to manage spoon and empty cup. Patient pleasant and pleased with her performance with OT. Patient attempting to communicate needs verbally throughout session. Patient will benefit from continued inpatient follow up therapy, <3 hours/day. Acute OT to continue to follow.    Recommendations for follow up therapy are one component of a multi-disciplinary discharge planning process, led by the attending physician.  Recommendations may be updated based on patient status, additional functional criteria and insurance authorization.    Assistance Recommended at Discharge Frequent or constant Supervision/Assistance  Patient can return home with the following  Two people to help with walking and/or transfers;A lot of help with bathing/dressing/bathroom;Assistance with cooking/housework;Assistance with feeding;Direct supervision/assist for medications management;Direct supervision/assist for financial management;Assist for transportation;Help with stairs or ramp for entrance   Equipment Recommendations  Other (comment) (defer)    Recommendations for Other Services      Precautions / Restrictions  Precautions Precautions: Fall Precaution Comments: NG tube Restrictions Weight Bearing Restrictions: No       Mobility Bed Mobility Overal bed mobility: Needs Assistance Bed Mobility: Rolling Rolling: Mod assist         General bed mobility comments: rolling in bed to addrss bed positioning    Transfers Overall transfer level: Needs assistance                 General transfer comment: not attempted     Balance Overall balance assessment: Needs assistance     Sitting balance - Comments: patient seen at bed level with HOB elevated to 45 degrees                                   ADL either performed or assessed with clinical judgement   ADL Overall ADL's : Needs assistance/impaired Eating/Feeding: NPO;Minimal assistance;With adaptive utensils Eating/Feeding Details (indicate cue type and reason): Patient NPO, simulated self feeding with empty cup and simulating loading utensil. Patient able to manage utensil with and withou built up foam. Grooming: Minimal assistance;Wash/dry face;Oral care;Bed level Grooming Details (indicate cue type and reason): able to brush teeth without built up toothbrush, assistance with toothpaste and managing spit basin                                    Extremity/Trunk Assessment              Vision       Perception     Praxis      Cognition Arousal/Alertness: Awake/alert Behavior During Therapy: WFL for tasks assessed/performed Overall Cognitive Status: Difficult to assess  General Comments: attempting to speak when asked orientation questions, able to give correct responses using calendar, aware that she was in [place], [facility]        Exercises      Shoulder Instructions       General Comments      Pertinent Vitals/ Pain       Pain Assessment Pain Assessment: Faces Faces Pain Scale: Hurts a little bit Pain Location:  bottom Pain Descriptors / Indicators: Sore, Grimacing Pain Intervention(s): Monitored during session, Repositioned  Home Living                                          Prior Functioning/Environment              Frequency  Min 1X/week        Progress Toward Goals  OT Goals(current goals can now be found in the care plan section)  Progress towards OT goals: Progressing toward goals  Acute Rehab OT Goals Patient Stated Goal: none stated OT Goal Formulation: With patient Time For Goal Achievement: 10/16/22 Potential to Achieve Goals: Fair ADL Goals Pt Will Perform Eating: with adaptive utensils;bed level;sitting;with min guard assist Pt Will Perform Grooming: with min guard assist;with adaptive equipment;sitting;bed level Additional ADL Goal #1: pt will perform bed mobility min A in prep for ADLs  Plan Discharge plan remains appropriate    Co-evaluation                 AM-PAC OT "6 Clicks" Daily Activity     Outcome Measure   Help from another person eating meals?: A Little Help from another person taking care of personal grooming?: A Little Help from another person toileting, which includes using toliet, bedpan, or urinal?: A Lot Help from another person bathing (including washing, rinsing, drying)?: A Lot Help from another person to put on and taking off regular upper body clothing?: A Lot Help from another person to put on and taking off regular lower body clothing?: Total 6 Click Score: 13    End of Session    OT Visit Diagnosis: Unsteadiness on feet (R26.81);Other abnormalities of gait and mobility (R26.89);Muscle weakness (generalized) (M62.81);Other symptoms and signs involving the nervous system (R29.898)   Activity Tolerance Patient tolerated treatment well   Patient Left in bed;with call bell/phone within reach   Nurse Communication Mobility status        Time: 1610-9604 OT Time Calculation (min): 24 min  Charges: OT  General Charges $OT Visit: 1 Visit OT Treatments $Self Care/Home Management : 23-37 mins  Alfonse Flavors, OTA Acute Rehabilitation Services  Office [phone]   Dewain Penning 10/04/2022, 12:30 PM

## 2022-10-05 DIAGNOSIS — Z7189 Other specified counseling: Secondary | ICD-10-CM | POA: Diagnosis not present

## 2022-10-05 DIAGNOSIS — Z8673 Personal history of transient ischemic attack (TIA), and cerebral infarction without residual deficits: Secondary | ICD-10-CM | POA: Diagnosis not present

## 2022-10-05 DIAGNOSIS — R1310 Dysphagia, unspecified: Secondary | ICD-10-CM | POA: Diagnosis not present

## 2022-10-05 DIAGNOSIS — I480 Paroxysmal atrial fibrillation: Secondary | ICD-10-CM | POA: Diagnosis not present

## 2022-10-05 DIAGNOSIS — I4891 Unspecified atrial fibrillation: Secondary | ICD-10-CM | POA: Diagnosis not present

## 2022-10-05 LAB — CBC
HCT: 27.7 % — ABNORMAL LOW (ref 36.0–46.0)
Hemoglobin: 8.9 g/dL — ABNORMAL LOW (ref 12.0–15.0)
MCH: 30.9 pg (ref 26.0–34.0)
MCHC: 32.1 g/dL (ref 30.0–36.0)
MCV: 96.2 fL (ref 80.0–100.0)
Platelets: 253 10*3/uL (ref 150–400)
RBC: 2.88 MIL/uL — ABNORMAL LOW (ref 3.87–5.11)
RDW: 13.6 % (ref 11.5–15.5)
WBC: 7.2 10*3/uL (ref 4.0–10.5)
nRBC: 0 % (ref 0.0–0.2)

## 2022-10-05 LAB — BASIC METABOLIC PANEL
Anion gap: 11 (ref 5–15)
BUN: 8 mg/dL (ref 8–23)
CO2: 25 mmol/L (ref 22–32)
Calcium: 8.3 mg/dL — ABNORMAL LOW (ref 8.9–10.3)
Chloride: 100 mmol/L (ref 98–111)
Creatinine, Ser: 0.43 mg/dL — ABNORMAL LOW (ref 0.44–1.00)
GFR, Estimated: >60 mL/min (ref >60)
Glucose, Bld: 140 mg/dL — ABNORMAL HIGH (ref 70–99)
Potassium: 3.6 mmol/L (ref 3.5–5.1)
Sodium: 136 mmol/L (ref 135–145)

## 2022-10-05 LAB — GLUCOSE, CAPILLARY
Glucose-Capillary: 113 mg/dL — ABNORMAL HIGH (ref 70–99)
Glucose-Capillary: 114 mg/dL — ABNORMAL HIGH (ref 70–99)
Glucose-Capillary: 137 mg/dL — ABNORMAL HIGH (ref 70–99)
Glucose-Capillary: 138 mg/dL — ABNORMAL HIGH (ref 70–99)
Glucose-Capillary: 142 mg/dL — ABNORMAL HIGH (ref 70–99)
Glucose-Capillary: 95 mg/dL (ref 70–99)

## 2022-10-05 LAB — MAGNESIUM
Magnesium: 1.8 mg/dL (ref 1.7–2.4)
Magnesium: 1.8 mg/dL (ref 1.7–2.4)

## 2022-10-05 LAB — PHOSPHORUS
Phosphorus: 1.9 mg/dL — ABNORMAL LOW (ref 2.5–4.6)
Phosphorus: 4.2 mg/dL (ref 2.5–4.6)

## 2022-10-05 MED ORDER — AMLODIPINE BESYLATE 5 MG PO TABS
5.0000 mg | ORAL_TABLET | Freq: Every day | ORAL | Status: DC
  Administered 2022-10-05 – 2022-10-11 (×7): 5 mg via NASOGASTRIC
  Filled 2022-10-05 (×7): qty 1

## 2022-10-05 MED ORDER — FLUTICASONE PROPIONATE 50 MCG/ACT NA SUSP
1.0000 | Freq: Every day | NASAL | Status: DC
  Administered 2022-10-06 – 2022-10-11 (×6): 1 via NASAL
  Filled 2022-10-05 (×2): qty 16

## 2022-10-05 MED ORDER — METOPROLOL TARTRATE 50 MG PO TABS
50.0000 mg | ORAL_TABLET | Freq: Two times a day (BID) | ORAL | Status: DC
  Administered 2022-10-05 – 2022-10-11 (×13): 50 mg via NASOGASTRIC
  Filled 2022-10-05 (×13): qty 1

## 2022-10-05 MED ORDER — DIVALPROEX SODIUM 125 MG PO CSDR: 125.0000 mg | DELAYED_RELEASE_CAPSULE | Freq: Two times a day (BID) | ORAL | Status: DC

## 2022-10-05 MED ORDER — SODIUM PHOSPHATES 45 MMOLE/15ML IV SOLN
30.0000 mmol | Freq: Once | INTRAVENOUS | Status: AC
  Administered 2022-10-05: 30 mmol via INTRAVENOUS
  Filled 2022-10-05: qty 10

## 2022-10-05 MED ORDER — DICLOFENAC SODIUM 1 % EX GEL
2.0000 g | Freq: Four times a day (QID) | CUTANEOUS | Status: DC
  Administered 2022-10-05 – 2022-10-11 (×23): 2 g via TOPICAL
  Filled 2022-10-05: qty 100

## 2022-10-05 MED ORDER — BUDESONIDE 0.25 MG/2ML IN SUSP
0.2500 mg | Freq: Two times a day (BID) | RESPIRATORY_TRACT | Status: DC
  Administered 2022-10-05 – 2022-10-11 (×9): 0.25 mg via RESPIRATORY_TRACT
  Filled 2022-10-05 (×9): qty 2

## 2022-10-05 MED ORDER — ATORVASTATIN CALCIUM 40 MG PO TABS
40.0000 mg | ORAL_TABLET | Freq: Every day | ORAL | Status: DC
  Administered 2022-10-05 – 2022-10-09 (×5): 40 mg via ORAL
  Filled 2022-10-05 (×6): qty 1

## 2022-10-05 NOTE — Progress Notes (Signed)
Speech Language Pathology Treatment: Dysphagia  Patient Details Name: Brenda Riggs MRN: [id number] DOB: 1956/03/16 Today's Date: 10/05/2022 Time: 1310-1330 SLP Time Calculation (min) (ACUTE ONLY): 20 min  Assessment / Plan / Recommendation Clinical Impression  Brenda Riggs seen for f/u dysphagia tx for po readiness/swallow tx.  Brenda Riggs with oral holding, prolonged oral manipulation, delayed onset of swallow initiation and wet vocal quality/respiratory change/snoring response pre/post intake.  Brenda Riggs with multiple swallows (Puree) and consistent weak cough/throat clearing response.  Consistencies assessed post oral care included: single ice chip, 1/3 tsp nectar-thick liquids and  1/3 tsp puree.  Brenda Riggs with uncoordinated swallow, grimacing and above mentioned s/sx of aspiration/dysphagia noted within session.  NPO status recommended continue at current time with TF provided for nutrition/hydration purposes.  ST will continue to f/u for dysphagia tx/management pending GOC.    HPI HPI: 67 year old with prior history of ICH and poor baseline with dysarthria and weakness worse on the left than right at baseline presenting for evaluation of worsening slurred speech and worsening weakness. MRI which does not show any evidence of an acute stroke. Per MD, Has a leukocytosis-wonder if this is recrudescence of old symptoms in the setting of an underlying infection or recrudescence of stroke symptoms in the setting of acute illness-new onset A-fib. Most recent MBS complete 08/20/20 indicated a severe oral and mild pharyngeal dysphagia mostly c/b decreased oral control from significant weakness, with recommendations for dysphagia 1 with thin liquid. MBS also in 2021 with recommendations for dys 3, thin liquid; MBS 10/03/22 with recommendation for NPO.  PO readiness f/u/swallow tx with GOC pending.      SLP Plan  Continue with current plan of care      Recommendations for follow up therapy are one component of a multi-disciplinary  discharge planning process, led by the attending physician.  Recommendations may be updated based on patient status, additional functional criteria and insurance authorization.    Recommendations  Diet recommendations: NPO Medication Administration: Via alternative means                  Oral care QID   Frequent or constant Supervision/Assistance Dysphagia, oropharyngeal phase (R13.12)     Continue with current plan of care     Pat Brenda Riggs,M.S., CCC-SLP  10/05/2022, 1:54 PM

## 2022-10-05 NOTE — Progress Notes (Signed)
   10/05/22 2056  BiPAP/CPAP/SIPAP  Reason BIPAP/CPAP not in use Non-compliant   RA satting 99%

## 2022-10-05 NOTE — Progress Notes (Signed)
PROGRESS NOTE Brenda Riggs  WUJ:[id number] DOB: 1956-01-12 DOA: 09/28/2022 PCP: [name], MD  Brief Narrative/Hospital Course: 67 yof w/ multiple medical comorbidities including asthma, diabetes mellitus type 2, hypertension, hyperlipidemia, ICH with residual dysarthria presented for worsening weakness and worsening speech difficulty on 09/28/22  In the ED:A-fib with RVR with HR 151.MRI brain - limited study but negative for acute intracranial abnormality. Neurology was consulted, felt likely recrudescence of old stroke symptoms in setting of underlying systemic infection. Possible small stroke in setting of A-fib. No anticoagulation recommended due to extremely high risk of hemorrhage in the brain because of presence of chronic microhemorrhages and prior ICH as per neurology.  Patient was admitted Managed with Cardizem drip for new onset A-fib.  Echo 5/30:EF 65 to 70%, no RWMA moderate concentric LVH  On 5/30 had some vomiting and no bowel movement since admission, x-ray abdomen multiple dilated loops of small bowel with decompressed colon possible SBO or ileus> General surgery was consulted. Seen by SLP> recommended n.p.o. and alternative means of nutrition, NG tube as failed swallow eval.   Subjective: Seen and examined MILDLY SLEEPY BUT FOLLOWS COMMANDS Weak on left side  NGT+ Overnight afebrile BP has been trending up 160s-180s, not hypoxic Labs showed hemoglobin 8.9 g Last BM 10/03/2022, reports passing flatus  Assessment and Plan: Principal Problem:   Atrial fibrillation with RVR (HCC) Active Problems:   Type II diabetes mellitus (HCC)   Essential hypertension, benign   Asthma   Hypokalemia   History of stroke   Atrial fibrillation (HCC)   Malnutrition of moderate degree   Ileus versus SBO: Seen by general surgery, and signed off.No surgical need per surgery okay for diet as  swallowing allows. Passing flatus, last bm 6/3. Continue IV fluid hydration.  Severe oral  dysphagia Moderate malnutrition: Cont core track tube feeding, SLP and RD is following continue gentle IV fluid  and PPI> wean off tube feed as able. PMT  consulted- ?? Peg fpr longterm feeding need if aligns with goaols of care.  New onset PAF: Converted to NSR Cardizem drip discontinued 6/1.  No anticoagulation recommended due to risk of intracranial hemorrhage, echo with normal EF TSH stable.  Cardiology consult if recurrence of A-fib  Worsening weakness and dysarthria  History of stroke: Seen by neurology MRI brain unremarkable felt to be a recrudescence of old stroke symptoms.  Hypokalemia/hypophosphatemia/hypomagnesemia: resolved: monitor. Phos low-replete. Recent Labs  Lab 10/01/22 0044 10/02/22 0038 10/02/22 1629 10/03/22 0820 10/03/22 1747 10/04/22 0740 10/04/22 1723 10/05/22 0633  K 3.7 3.2*  --  3.7  --  3.8  --  3.6  CALCIUM 8.8* 8.7*  --  8.6*  --  8.3*  --  8.3*  MG  --  2.1   < > 1.9 2.0 1.9 1.8 1.8  PHOS  --  3.2   < > 2.2* 2.8 2.9 2.0* 1.9*   < > = values in this interval not displayed.    Type 2 diabetes mellitus blood sugar is controlled on SSI. Recent Labs  Lab 10/04/22 1622 10/04/22 2011 10/05/22 0005 10/05/22 0359 10/05/22 0724  GLUCAP 94 122* 114* 142* 138*    Leukocytosis resolved,?etiology   NFA:OZHYQMVH,QIONGEXB IVF. Recent Labs    09/28/22 2337 09/28/22 2342 09/29/22 0550 09/30/22 0037 10/01/22 0044 10/02/22 0038 10/03/22 0820 10/04/22 0740 10/05/22 0633  BUN 15 16 12  24* 26* 18 9 8 8   CREATININE 0.56 0.40* 0.62 1.21* 0.74 0.68 0.43* 0.49 0.43*  CO2 28  --  27  29 29 28 27 23 25    OSA continue nightly CPAP  ZOX:WRUE low stable b12,fa-cont iv iron x 5 days. Recent Labs  Lab 10/01/22 0044 10/02/22 0038 10/03/22 0820 10/04/22 0740 10/05/22 0633  HGB 10.4* 10.3* 9.8* 9.1* 8.9*  HCT 31.6* 32.3* 30.4* 29.1* 27.7*    Severe dysphagia complex co-morbidities-PMT consulted and planning on family meeting 6/6 Nutrition Problem:  Moderate Malnutrition Etiology: chronic illness Signs/Symptoms: mild fat depletion, mild muscle depletion Interventions: Tube feeding, Refer to RD note for recommendations   DVT prophylaxis: enoxaparin (LOVENOX) injection 40 mg Start: 09/29/22 1000 Code Status:   Code Status: Full Code Family Communication: plan of care discussed with patient at bedside. Patient status is:  inpatient  because of  dysphagia Level of care: Progressive   Dispo: The patient is from:snf            Anticipated disposition: tbd Objective: Vitals last 24 hrs: Vitals:   10/05/22 0007 10/05/22 0400 10/05/22 0403 10/05/22 0727  BP:  (!) 161/69 (!) 160/65 (!) 182/66  Pulse: 74 77 76 77  Resp: 17 15 17 19   Temp:   98.4 F (36.9 C) 98.8 F (37.1 C)  TempSrc:   Axillary Axillary  SpO2: 98% 99% 100% 100%  Weight:   55.3 kg   Height:       Weight change: 0.907 kg  Physical Examination: General exam: AA mildly sleepy,weak,older appearing HEENT:Oral mucosa moist, Ear/Nose WNL grossly, dentition normal. Respiratory system: bilaterally clear BS, no use of accessory muscle Cardiovascular system: S1 & S2 +, regular rate,. Gastrointestinal system: Abdomen soft, NT,ND,BS+ Nervous System:Alert, awake, weaker left side Extremities: LE ankle edema neg, lower extremities warm Skin: No rashes,no icterus. MSK: Normal muscle bulk,tone, power  NGT+  Medications reviewed:  Scheduled Meds:  enoxaparin (LOVENOX) injection  40 mg Subcutaneous Daily   feeding supplement (PROSource TF20)  60 mL Per Tube Daily   free water  110 mL Per Tube Q4H   insulin aspart  0-6 Units Subcutaneous Q4H   [START ON 10/07/2022] iron polysaccharides  150 mg Oral Daily   mouth rinse  15 mL Mouth Rinse 4 times per day   pantoprazole (PROTONIX) IV  40 mg Intravenous Q24H   sodium chloride flush  3 mL Intravenous Q12H   Continuous Infusions:  dextrose 5 % and 0.9 % NaCl with KCl 20 mEq/L 75 mL/hr at 10/05/22 0548   feeding supplement  (OSMOLITE 1.5 CAL) 1,000 mL (10/05/22 0118)   iron sucrose 200 mg (10/04/22 0821)   sodium phosphate 30 mmol in dextrose 5 % 250 mL infusion     Diet Order             Diet NPO time specified  Diet effective now                  Intake/Output Summary (Last 24 hours) at 10/05/2022 0921 Last data filed at 10/05/2022 0406 Gross per 24 hour  Intake --  Output 200 ml  Net -200 ml   Net IO Since Admission: 346.49 mL [10/05/22 0921]  Wt Readings from Last 3 Encounters:  10/05/22 55.3 kg  08/18/20 54 kg  03/28/17 61.2 kg     Unresulted Labs (From admission, onward)     Start     Ordered   10/06/22 0500  Creatinine, serum  (enoxaparin (LOVENOX)    CrCl >/= 30 ml/min)  Weekly,   R     Comments: while on enoxaparin therapy    09/29/22 0237  10/04/22 0500  CBC  Daily,   R     Question:  Specimen collection method  Answer:  Lab=Lab collect   10/03/22 0843   10/04/22 0500  Basic metabolic panel  Daily,   R     Question:  Specimen collection method  Answer:  Lab=Lab collect   10/03/22 0843   10/03/22 1700  Magnesium  (ICU Tube Feeding: PEPuP )  5A & 5P,   R (with TIMED occurrences)     Question:  Specimen collection method  Answer:  Lab=Lab collect   10/03/22 1431   10/03/22 1700  Phosphorus  (ICU Tube Feeding: PEPuP )  5A & 5P,   R (with TIMED occurrences)     Question:  Specimen collection method  Answer:  Lab=Lab collect   10/03/22 1431   10/02/22 0500  Basic metabolic panel  Daily,   R     Question:  Specimen collection method  Answer:  Lab=Lab collect   10/01/22 1006   10/02/22 0500  CBC  Daily,   R     Question:  Specimen collection method  Answer:  Lab=Lab collect   10/01/22 1006          Data Reviewed: I have personally reviewed following labs and imaging studies CBC: Recent Labs  Lab 09/28/22 2337 09/28/22 2342 10/01/22 0044 10/02/22 0038 10/03/22 0820 10/04/22 0740 10/05/22 0633  WBC 18.3*   < > 6.0 7.8 8.1 6.4 7.2  NEUTROABS 15.3*  --   --   --   --    --   --   HGB 12.5   < > 10.4* 10.3* 9.8* 9.1* 8.9*  HCT 38.4   < > 31.6* 32.3* 30.4* 29.1* 27.7*  MCV 96.7   < > 92.4 93.1 93.5 98.0 96.2  PLT 259   < > 296 281 267 216 253   < > = values in this interval not displayed.   Basic Metabolic Panel: Recent Labs  Lab 10/01/22 0044 10/02/22 0038 10/02/22 1629 10/03/22 0820 10/03/22 1747 10/04/22 0740 10/04/22 1723 10/05/22 0633  NA 136 140  --  139  --  137  --  136  K 3.7 3.2*  --  3.7  --  3.8  --  3.6  CL 98 101  --  106  --  103  --  100  CO2 29 28  --  27  --  23  --  25  GLUCOSE 105* 115*  --  121*  --  132*  --  140*  BUN 26* 18  --  9  --  8  --  8  CREATININE 0.74 0.68  --  0.43*  --  0.49  --  0.43*  CALCIUM 8.8* 8.7*  --  8.6*  --  8.3*  --  8.3*  MG  --  2.1   < > 1.9 2.0 1.9 1.8 1.8  PHOS  --  3.2   < > 2.2* 2.8 2.9 2.0* 1.9*   < > = values in this interval not displayed.   GFR: Estimated Creatinine Clearance: 50.1 mL/min (A) (by C-G formula based on SCr of 0.43 mg/dL (L)). Liver Function Tests: Recent Labs  Lab 09/28/22 2337 09/30/22 0037  AST 28 25  ALT 23 19  ALKPHOS 71 65  BILITOT 0.6 0.7  PROT 7.7 7.0  ALBUMIN 3.8 3.3*   Recent Labs  Lab 09/28/22 2337  INR 1.0   Recent Labs  Lab 10/04/22 1622 10/04/22 2011 10/05/22  0005 10/05/22 0359 10/05/22 0724  GLUCAP 94 122* 114* 142* 138*   Recent Results (from the past 240 hour(s))  MRSA Next Gen by PCR, Nasal     Status: None   Collection Time: 09/29/22  4:39 AM   Specimen: Nasal Mucosa; Nasal Swab  Result Value Ref Range Status   MRSA by PCR Next Gen NOT DETECTED NOT DETECTED Final    Comment: (NOTE) The GeneXpert MRSA Assay (FDA approved for NASAL specimens only), is one component of a comprehensive MRSA colonization surveillance program. It is not intended to diagnose MRSA infection nor to guide or monitor treatment for MRSA infections. Test performance is not FDA approved in patients less than 75 years old. Performed at [facility]  Lab, [address], [place], [place] [address]     Antimicrobials: Anti-infectives (From admission, onward)    None      Culture/Microbiology    Component Value Date/Time   SDES  08/15/2020 1610    BLOOD RIGHT ARM Performed at [facility], [address], [place], [place] [address]    SPECREQUEST  08/15/2020 1610    BOTTLES DRAWN AEROBIC AND ANAEROBIC Blood Culture results may not be optimal due to an inadequate volume of blood received in culture bottles Performed at [facility], [address] [place], [place] [address]    CULT  08/15/2020 1610    NO GROWTH 5 DAYS Performed at [facility], [address] [place], [place] [address]    REPTSTATUS 08/20/2020 FINAL 08/15/2020 1610  Radiology Studies: DG Abd Portable 1V  Result Date: 10/03/2022 CLINICAL DATA:  Encounter for feeding tube placement. EXAM: PORTABLE ABDOMEN - 1 VIEW COMPARISON:  10/02/2022 FINDINGS: Feeding tube in the upper abdomen with the tip just right of midline. The tip is likely in the distal stomach near the duodenal bulb. There is oral contrast in the colon and a small amount of contrast within the stomach. Gaseous distention of bowel loops throughout the abdomen. IMPRESSION: 1. Feeding tube tip is likely in the distal stomach near the duodenal bulb. 2. Gaseous distention of bowel loops throughout the abdomen. Electronically Signed   By: Richarda Overlie M.D.   On: 10/03/2022 12:38   DG Swallowing Func-Speech Pathology  Result Date: 10/03/2022 Table formatting from the original result was not included. Modified Barium Swallow Study Patient Details Name: ALYVEA KICK MRN: [id number] Date of Birth: [birth year] Today's Date: 10/03/2022 HPI/PMH: HPI: 67 year old with prior history of ICH and poor baseline with dysarthria and weakness worse on the left than right at baseline presenting for evaluation of worsening slurred speech and worsening weakness.  MRI which does not show any evidence of an  acute stroke.  Per MD, Has a leukocytosis-wonder if this is recrudescence of old symptoms in the setting of an underlying infection or recrudescence of stroke symptoms in the setting of acute illness-new onset A-fib. Most recent MBS complete 08/20/20 indicated a  severe oral and mild pharyngeal dysphagia mostly c/b decreased oral control from significant weakness, with recommendations for dysphagia 1 with thin liquid.  MBs also in 2021 with recommendations for dys 3, thin liquid. Clinical Impression: Clinical Impression: Pt demonstrates a severe oral dysphagia with very limited lingual function for bolus propulsion. When given teaspoons or straw sips pt has anterior spillage, lingual thrusting for propulsion with loss of bolus to the floor of mouth. Pt often spews liquid from mouth while attempting to manipulate. Pt uses a posterior head tilt to  transit boluses. Thin, honey and puree all spill to pyriforms and airway prior to swallow initaition while pt still attempting to propel remainder of bolus. Aspiration is silent. Though pt does have involuntary movement of base of tongue and soft palate, at rest both appear flaccid and pt has snoring respirations. THere is a constant pulsing movement of all oropharyngeal musculature. Pharyngeal strength and ROM of motion quite good once swallow triggered. Unsure of etiology of impairment. There is a dramatic difference since last MBS in 2022. Unsure of potential for improvement, would expect limited ability to participate in therapy. Factors that may increase risk of adverse event in presence of aspiration [name] & [name] 2021): Factors that may increase risk of adverse event in presence of aspiration [name] & [name] 2021): Weak cough; Aspiration of thick, dense, and/or acidic materials; Frequent aspiration of large volumes; Limited mobility; Reduced cognitive function; Poor general health and/or compromised immunity; Frail or deconditioned Recommendations/Plan:  Swallowing Evaluation Recommendations Swallowing Evaluation Recommendations Recommendations: NPO; Alternative means of nutrition - NG Tube Medication Administration: Via alternative means Oral care recommendations: Oral care QID (4x/day) Recommended consults: Consider Palliative care Treatment Plan Treatment Plan Treatment recommendations: Therapy as outlined in treatment plan below Follow-up recommendations: Skilled nursing-short term rehab (<3 hours/day) Functional status assessment: Patient has had a recent decline in their functional status and/or demonstrates limited ability to make significant improvements in function in a reasonable and predictable amount of time. Treatment frequency: Min 2x/week Treatment duration: 2 weeks Interventions: Patient/family education; Oropharyngeal exercises; Aspiration precaution training; Trials of upgraded texture/liquids Recommendations Recommendations for follow up therapy are one component of a multi-disciplinary discharge planning process, led by the attending physician.  Recommendations may be updated based on patient status, additional functional criteria and insurance authorization. Assessment: Orofacial Exam: Orofacial Exam Oral Cavity - Dentition: Adequate natural dentition Anatomy: No data recorded Boluses Administered: Boluses Administered Boluses Administered: Thin liquids (Level 0); Moderately thick liquids (Level 3, honey thick); Puree  Oral Impairment Domain: Oral Impairment Domain Lip Closure: Escape beyond mid-chin Tongue control during bolus hold: Posterior escape of greater than half of bolus Bolus preparation/mastication: -- (NT) Bolus transport/lingual motion: Minimal-no tongue motion Oral residue: Majority of bolus remaining Location of oral residue : Floor of mouth; Lateral sulci Initiation of pharyngeal swallow : Pyriform sinuses (airway)  Pharyngeal Impairment Domain: Pharyngeal Impairment Domain Soft palate elevation: No bolus between soft palate  (SP)/pharyngeal wall (PW) Laryngeal elevation: Complete superior movement of thyroid cartilage with complete approximation of arytenoids to epiglottic petiole Anterior hyoid excursion: Partial anterior movement Epiglottic movement: Partial inversion Laryngeal vestibule closure: Complete, no air/contrast in laryngeal vestibule Pharyngeal stripping wave : Present - complete Pharyngeal contraction (A/P view only): N/A Pharyngoesophageal segment opening: Complete distension and complete duration, no obstruction of flow Tongue base retraction: No contrast between tongue base and posterior pharyngeal wall (PPW) Pharyngeal residue: Trace residue within or on pharyngeal structures  Esophageal Impairment Domain: No data recorded Pill: No data recorded Penetration/Aspiration Scale Score: Penetration/Aspiration Scale Score 8.  Material enters airway, passes BELOW cords without attempt by patient to eject out (silent aspiration) : Thin liquids (Level 0); Moderately thick liquids (Level 3, honey thick); Puree Compensatory Strategies: Compensatory Strategies Compensatory strategies: No   General Information: Caregiver present: No  Diet Prior to this Study: NPO   Temperature : Normal   No data recorded  Supplemental O2: None (Room air)   History of Recent Intubation: No  Behavior/Cognition: Alert; Cooperative Self-Feeding Abilities: Dependent for feeding Baseline vocal quality/speech: Abnormal resonance;  Dysphonic Volitional Cough: Able to elicit Volitional Swallow: Able to elicit No data recorded Goal Planning: Prognosis for improved oropharyngeal function: Guarded Barriers to Reach Goals: Cognitive deficits; Time post onset; Severity of deficits; Behavior; Overall medical prognosis No data recorded No data recorded Consulted and agree with results and recommendations: Pt unable/family or caregiver not available; Physician; Nurse Pain: Pain Assessment Pain Assessment: No/denies pain Faces Pain Scale: 0 Breathing: 0 Negative  Vocalization: 0 Facial Expression: 0 Body Language: 0 Consolability: 0 PAINAD Score: 0 End of Session: Start Time:SLP Start Time (ACUTE ONLY): 0940 Stop Time: SLP Stop Time (ACUTE ONLY): 1000 Time Calculation:SLP Time Calculation (min) (ACUTE ONLY): 20 min Charges: SLP Evaluations $ SLP Speech Visit: 1 Visit SLP Evaluations $BSS Swallow: 1 Procedure $MBS Swallow: 1 Procedure $Swallowing Treatment: 1 Procedure SLP visit diagnosis: SLP Visit Diagnosis: Dysphagia, oral phase (R13.11); Dysphagia, oropharyngeal phase (R13.12) Past Medical History: Past Medical History: Diagnosis Date  Allergy   Asthma   Blood transfusion without reported diagnosis   Chronic kidney disease   COPD (chronic obstructive pulmonary disease) (HCC)   Depression   Diabetes mellitus without complication (HCC)   Hyperlipidemia   Hypertension   Stroke [facility])  Past Surgical History: Past Surgical History: Procedure Laterality Date  ABDOMINAL HYSTERECTOMY    APPENDECTOMY    CESAREAN SECTION    TUBAL LIGATION   DeBlois, Riley Nearing 10/03/2022, 10:48 AM    LOS: 6 days   [name], MD [facility]  10/05/2022, 9:21 AM

## 2022-10-05 NOTE — Progress Notes (Signed)
Patient ID: SHEENAH DIMITROFF, female   DOB: 01/27/1956, 67 y.o.   MRN: [id number]    Progress Note from the Palliative Medicine Team at [facility]   Patient Name: Brenda Riggs        Date: 10/05/2022 DOB: 14-May-1955  Age: 67 y.o. MRN#: [id number] Attending Physician: [name], MD Primary Care Physician: [name], MD Admit Date: 09/28/2022   Medical records reviewed   67 y.o. female   admitted on 09/28/2022 with past medical history significant for asthma, type 2 diabetes mellitus, hypertension, hyperlipidemia, and ICH with residual dysarthria who presents for evaluation of worsened weakness and worsened speech difficulty.  New onset A-fib   Per report of the patient's facility, according to social work note she is a resident of [facility]. Facility transferred patient to the emergency room with increasing weakness.  Most recent MBS complete 08/20/20 indicated a severe oral and mild pharyngeal dysphagia mostly c/b decreased oral control from significant weakness, with recommendations for dysphagia 1 with thin liquid. MBs also in 2021 with recommendations for dys 3, thin liquid.    Family report continued physical and functional decline over the past many months.   Patient requires assistance with ADLs, is essentially bedbound, and has chronic dysarthria, dysphagia, and weakness.  Today is day 7 of this hospitalization.  She and continues with core track for nutritional support.  She remains high risk for aspiration  This NP assessed patient at the bedside as a follow up for palliative and needs and emotional support.  Patient is alert, communication is difficult 2/2 dysarthria.    Spoke to patient's daughter/ Edward Qualia  by telephone.  Education offered on current medical situation and multiple comorbidities.  Education offered today regarding  the importance of continued conversation with family and the  medical providers regarding overall plan of care and treatment  options,  ensuring decisions are within the context of the patients values and GOCs.  Family meeting is planned for Friday, June 7 at 1015.  Questions and concerns addressed   Discussed with Dr Jonathon Bellows   Time:25  minutes   Detailed review of medical records ( labs, imaging, vital signs), medically appropriate exam ( MS, skin, resp)   discussed with treatment team, counseling and education to patient, family, staff, documenting clinical information, medication management, coordination of care    Lorinda Creed NP  Palliative Medicine Team Team Phone # [phone] Pager [phone]

## 2022-10-06 DIAGNOSIS — I4891 Unspecified atrial fibrillation: Secondary | ICD-10-CM | POA: Diagnosis not present

## 2022-10-06 DIAGNOSIS — I480 Paroxysmal atrial fibrillation: Secondary | ICD-10-CM | POA: Diagnosis not present

## 2022-10-06 LAB — GLUCOSE, CAPILLARY
Glucose-Capillary: 105 mg/dL — ABNORMAL HIGH (ref 70–99)
Glucose-Capillary: 106 mg/dL — ABNORMAL HIGH (ref 70–99)
Glucose-Capillary: 110 mg/dL — ABNORMAL HIGH (ref 70–99)
Glucose-Capillary: 122 mg/dL — ABNORMAL HIGH (ref 70–99)
Glucose-Capillary: 129 mg/dL — ABNORMAL HIGH (ref 70–99)
Glucose-Capillary: 134 mg/dL — ABNORMAL HIGH (ref 70–99)
Glucose-Capillary: 96 mg/dL (ref 70–99)

## 2022-10-06 LAB — CBC
HCT: 27.6 % — ABNORMAL LOW (ref 36.0–46.0)
Hemoglobin: 9 g/dL — ABNORMAL LOW (ref 12.0–15.0)
MCH: 30.4 pg (ref 26.0–34.0)
MCHC: 32.6 g/dL (ref 30.0–36.0)
MCV: 93.2 fL (ref 80.0–100.0)
Platelets: 276 10*3/uL (ref 150–400)
RBC: 2.96 MIL/uL — ABNORMAL LOW (ref 3.87–5.11)
RDW: 13.8 % (ref 11.5–15.5)
WBC: 6.8 10*3/uL (ref 4.0–10.5)
nRBC: 0 % (ref 0.0–0.2)

## 2022-10-06 LAB — BASIC METABOLIC PANEL
Anion gap: 8 (ref 5–15)
BUN: 6 mg/dL — ABNORMAL LOW (ref 8–23)
CO2: 27 mmol/L (ref 22–32)
Calcium: 8.3 mg/dL — ABNORMAL LOW (ref 8.9–10.3)
Chloride: 102 mmol/L (ref 98–111)
Creatinine, Ser: 0.44 mg/dL (ref 0.44–1.00)
GFR, Estimated: >60 mL/min (ref >60)
Glucose, Bld: 156 mg/dL — ABNORMAL HIGH (ref 70–99)
Potassium: 3.8 mmol/L (ref 3.5–5.1)
Sodium: 137 mmol/L (ref 135–145)

## 2022-10-06 LAB — PHOSPHORUS: Phosphorus: 2.5 mg/dL (ref 2.5–4.6)

## 2022-10-06 LAB — MAGNESIUM: Magnesium: 1.8 mg/dL (ref 1.7–2.4)

## 2022-10-06 NOTE — Progress Notes (Signed)
   10/06/22 2328  BiPAP/CPAP/SIPAP  Reason BIPAP/CPAP not in use Non-compliant  BiPAP/CPAP /SiPAP Vitals  Resp 17  MEWS Score/Color  MEWS Score 0  MEWS Score Color Brenda Riggs

## 2022-10-06 NOTE — Progress Notes (Signed)
Physical Therapy Treatment Patient Details Name: Brenda Riggs MRN: [id number] DOB: 09-19-1955 Today's Date: 10/06/2022   History of Present Illness Pt is a 67 y/o F presenting to ED from [facility] with worsening weakness and speech difficulty. MRI brain negative for acute intracranial abnormaility. Neuro consulted and felt recredescence of old stroke symptoms in the setting of underlying systemic infection. Admitted for A fib with RVR. PMH includes asthma, DM2, HTN, HLD, ICH with residual dysarthria    PT Comments    Pt with similar presentation to previous session. Pt continues to require +2 Total A for transfer to chair. No change in DC/DME recs at this time. PT will continue to follow.  Recommendations for follow up therapy are one component of a multi-disciplinary discharge planning process, led by the attending physician.  Recommendations may be updated based on patient status, additional functional criteria and insurance authorization.  Follow Up Recommendations  Can patient physically be transported by private vehicle: No    Assistance Recommended at Discharge Frequent or constant Supervision/Assistance  Patient can return home with the following Two people to help with walking and/or transfers;Two people to help with bathing/dressing/bathroom;Assistance with cooking/housework;Assistance with feeding;Direct supervision/assist for medications management;Direct supervision/assist for financial management;Assist for transportation;Help with stairs or ramp for entrance   Equipment Recommendations  None recommended by PT    Recommendations for Other Services       Precautions / Restrictions Precautions Precautions: Fall Precaution Comments: NG tube Restrictions Weight Bearing Restrictions: No     Mobility  Bed Mobility Overal bed mobility: Needs Assistance Bed Mobility: Supine to Sit     Supine to sit: +2 for physical assistance, Max assist     General bed mobility  comments: Use of bed pads for scooting EOB.    Transfers Overall transfer level: Needs assistance Equipment used: 2 person hand held assist Transfers: Bed to chair/wheelchair/BSC       Squat pivot transfers: Total assist, +2 physical assistance     General transfer comment: pt able to hold onto back of therapist arms while squat pivoting. pt not able to provide much assistance    Ambulation/Gait               General Gait Details: pt did not ambulate PTA   Stairs             Wheelchair Mobility    Modified Rankin (Stroke Patients Only)       Balance Overall balance assessment: Needs assistance Sitting-balance support: Feet supported Sitting balance-Leahy Scale: Poor Sitting balance - Comments: heavy posterior lean Postural control: Posterior lean                                  Cognition Arousal/Alertness: Awake/alert Behavior During Therapy: WFL for tasks assessed/performed Overall Cognitive Status: Difficult to assess                                 General Comments: Pt with difficulty communicating however able to follow commands with increased time and able to give thumbs up or nod to closed ended questions. pt was able to write "thank you" on a piece of paper with a pen.        Exercises      General Comments General comments (skin integrity, edema, etc.): VSS on RA      Pertinent Vitals/Pain Pain  Assessment Pain Assessment: No/denies pain    Home Living                          Prior Function            PT Goals (current goals can now be found in the care plan section) Progress towards PT goals: Progressing toward goals    Frequency    Min 1X/week      PT Plan Current plan remains appropriate    Co-evaluation              AM-PAC PT "6 Clicks" Mobility   Outcome Measure  Help needed turning from your back to your side while in a flat bed without using bedrails?:  Total Help needed moving from lying on your back to sitting on the side of a flat bed without using bedrails?: Total Help needed moving to and from a bed to a chair (including a wheelchair)?: Total Help needed standing up from a chair using your arms (e.g., wheelchair or bedside chair)?: Total Help needed to walk in hospital room?: Total Help needed climbing 3-5 steps with a railing? : Total 6 Click Score: 6    End of Session Equipment Utilized During Treatment: Gait belt Activity Tolerance: Patient tolerated treatment well Patient left: in chair;with call bell/phone within reach;with chair alarm set Nurse Communication: Mobility status;Need for lift equipment PT Visit Diagnosis: Muscle weakness (generalized) (M62.81)     Time: 1610-9604 PT Time Calculation (min) (ACUTE ONLY): 30 min  Charges:  $Therapeutic Activity: 23-37 mins                     Shela Nevin, PT, DPT Acute Rehab Services [phone]    Gladys Damme 10/06/2022, 3:46 PM

## 2022-10-06 NOTE — Progress Notes (Signed)
PROGRESS NOTE Brenda Riggs  ZOX:[id number] DOB: 1956-03-14 DOA: 09/28/2022 PCP: [name], MD  Brief Narrative/Hospital Course: 67 yof w/ multiple medical comorbidities including asthma, diabetes mellitus type 2, hypertension, hyperlipidemia, ICH with residual dysarthria presented for worsening weakness and worsening speech difficulty on 09/28/22  In the ED:A-fib with RVR with HR 151.MRI brain - limited study but negative for acute intracranial abnormality. Neurology was consulted, felt likely recrudescence of old stroke symptoms in setting of underlying systemic infection. Possible small stroke in setting of A-fib. No anticoagulation recommended due to extremely high risk of hemorrhage in the brain because of presence of chronic microhemorrhages and prior ICH as per neurology.  Patient was admitted Managed with Cardizem drip for new onset A-fib.  Echo 5/30:EF 65 to 70%, no RWMA moderate concentric LVH  On 5/30 had some vomiting and no bowel movement since admission, x-ray abdomen multiple dilated loops of small bowel with decompressed colon possible SBO or ileus> General surgery was consulted. Seen by SLP> recommended n.p.o. and alternative means of nutrition, NG tube as failed swallow eval.   Subjective:  Seen and examined Just waking up Follows some commands Last BM 6/4- states passing flatus Core track in place patient comes in himself  Assessment and Plan: Principal Problem:   Atrial fibrillation with RVR (HCC) Active Problems:   Type II diabetes mellitus (HCC)   Essential hypertension, benign   Asthma   Hypokalemia   History of stroke   Atrial fibrillation (HCC)   Malnutrition of moderate degree   Ileus versus SBO: Seen by general surgery, and signed off.No surgical need and advanced diet as  swallowing allows but NPO 2/2 dysphagia.  Last BM 6/4, passing flatus continue core track feeding gentle IVF  Severe oral dysphagia Moderate malnutrition: Recent MBS:08/20/20 a  severe oral and mild pharyngeal dysphagia mostly c/b decreased oral control from significant weakness> dysphagia diet with thin liquids was recommended. MBS also in 2021> dysphagia 3 with thin liquid was recommended MBS 6/3> n.p.o. recommended. Patient remains on core track tube feeding and ivf.Speech and RD following closely . Cont PPI. PMT consulted-for goals of care-pending was to decide on,?? PEG for longterm plan.   New onset PAF: Converted to NSR.Cardizem drip discontinued 6/1.  No anticoagulation recommended due to risk of intracranial hemorrhage, echo with normal EF TSH stable.  Cardiology consult if recurrence of A-fib  Worsening weakness and dysarthria  History of stroke: Seen by neurology MRI brain unremarkable felt to be a recrudescence of old stroke symptoms.  Continue her statin.  Hypokalemia/hypophosphatemia/hypomagnesemia: Electrolytes stable as below Recent Labs  Lab 10/02/22 0038 10/02/22 1629 10/03/22 0820 10/03/22 1747 10/04/22 0740 10/04/22 1723 10/05/22 0633 10/05/22 1641 10/06/22 0623  K 3.2*  --  3.7  --  3.8  --  3.6  --  3.8  CALCIUM 8.7*  --  8.6*  --  8.3*  --  8.3*  --  8.3*  MG 2.1   < > 1.9   < > 1.9 1.8 1.8 1.8 1.8  PHOS 3.2   < > 2.2*   < > 2.9 2.0* 1.9* 4.2 2.5   < > = values in this interval not displayed.   Type 2 diabetes mellitus blood sugar is well controlled on SSI. Recent Labs  Lab 10/05/22 1507 10/05/22 2004 10/05/22 2359 10/06/22 0406 10/06/22 0728  GLUCAP 95 137* 129* 122* 134*     Leukocytosis resolved,?etiology   WJX:BJYNWGNF, wean down IVF. Recent Labs    09/28/22 2337 09/28/22 2342  09/29/22 0550 09/30/22 0037 10/01/22 0044 10/02/22 0038 10/03/22 0820 10/04/22 0740 10/05/22 0633 10/06/22 0623  BUN 15 16 12  24* 26* 18 9 8 8  6*  CREATININE 0.56 0.40* 0.62 1.21* 0.74 0.68 0.43* 0.49 0.43* 0.44  CO2 28  --  27 29 29 28 27 23 25 27     OSA continue nightly CPAP  ZOX:WRUE low stable b12,fa-completed IV iron x 5  days. Recent Labs  Lab 10/02/22 0038 10/03/22 0820 10/04/22 0740 10/05/22 4540 10/06/22 0623  HGB 10.3* 9.8* 9.1* 8.9* 9.0*  HCT 32.3* 30.4* 29.1* 27.7* 27.6*     Failure to thrive/deconditioning complex comorbidities with dysphagia stroke and malnutrition moderate PMT consulted and planning on family meeting 10/06/22 for further goals of care Consider DNR/DNI understanding evidenced-based poor outcomes in similar hospitalized ptient, as a cause of arrest is likely associated with advanced chronic illness rather than easily reversible acute cardiopulmonary.  She has complex medical comorbidity and prognosis does not appear bright  Nutrition Problem: Moderate Malnutrition Etiology: chronic illness Signs/Symptoms: mild fat depletion, mild muscle depletion Interventions: Tube feeding, Refer to RD note for recommendations   DVT prophylaxis: enoxaparin (LOVENOX) injection 40 mg Start: 09/29/22 1000 Code Status:   Code Status: Full Code Family Communication: plan of care discussed with patient at bedside. Patient status is:  inpatient  because of  dysphagia Level of care: Progressive   Dispo: The patient is from:snf            Anticipated disposition: tbd Objective: Vitals last 24 hrs: Vitals:   10/05/22 2055 10/05/22 2359 10/06/22 0410 10/06/22 0412  BP:  (!) 153/62 (!) 162/74   Pulse:  74 83   Resp:  19 19   Temp:  98.4 F (36.9 C) 98.9 F (37.2 C)   TempSrc:  Oral Oral   SpO2: 99% 100% 100%   Weight:    55.8 kg  Height:       Weight change: 0.454 kg  Physical Examination: General exam: sleepy,67 appearing HEENT:Oral mucosa moist, Ear/Nose WNL grossly, dentition normal. Respiratory system: bilaterally diminished BS, no use of accessory muscle Cardiovascular system: S1 & S2 +, regular rate, JVD neg Gastrointestinal system: Abdomen soft, NT,ND,BS+ Nervous System:sleepy, weakness all over but more pronounced on the left extremities, dysarthria Extremities: LE ankle  edema neg, lower extremities warm Skin: No rashes,no icterus. MSK: Normal muscle bulk,tone, power  Cortrak+  Medications reviewed:  Scheduled Meds:  amLODipine  5 mg Per NG tube Daily   atorvastatin  40 mg Oral q1800   budesonide (PULMICORT) nebulizer solution  0.25 mg Nebulization BID   diclofenac Sodium  2 g Topical QID   enoxaparin (LOVENOX) injection  40 mg Subcutaneous Daily   feeding supplement (PROSource TF20)  60 mL Per Tube Daily   fluticasone  1 spray Each Nare Daily   free water  110 mL Per Tube Q4H   insulin aspart  0-6 Units Subcutaneous Q4H   [START ON 10/07/2022] iron polysaccharides  150 mg Oral Daily   metoprolol tartrate  50 mg Per NG tube BID   mouth rinse  15 mL Mouth Rinse 4 times per day   pantoprazole (PROTONIX) IV  40 mg Intravenous Q24H   sodium chloride flush  3 mL Intravenous Q12H   Continuous Infusions:  dextrose 5 % and 0.9 % NaCl with KCl 20 mEq/L 75 mL/hr at 10/06/22 0210   feeding supplement (OSMOLITE 1.5 CAL) 1,000 mL (10/06/22 0525)   iron sucrose 200 mg (10/05/22 1143)  Diet Order             Diet NPO time specified  Diet effective now                  Intake/Output Summary (Last 24 hours) at 10/06/2022 0814 Last data filed at 10/06/2022 0001 Gross per 24 hour  Intake 1880.8 ml  Output 1200 ml  Net 680.8 ml    Net IO Since Admission: 1,027.29 mL [10/06/22 0814]  Wt Readings from Last 3 Encounters:  10/06/22 55.8 kg  08/18/20 54 kg  03/28/17 61.2 kg     Unresulted Labs (From admission, onward)     Start     Ordered   10/06/22 0500  Creatinine, serum  (enoxaparin (LOVENOX)    CrCl >/= 30 ml/min)  Weekly,   R     Comments: while on enoxaparin therapy    09/29/22 0237   10/04/22 0500  CBC  Daily,   R     Question:  Specimen collection method  Answer:  Lab=Lab collect   10/03/22 0843   10/04/22 0500  Basic metabolic panel  Daily,   R     Question:  Specimen collection method  Answer:  Lab=Lab collect   10/03/22 0843           Data Reviewed: I have personally reviewed following labs and imaging studies CBC: Recent Labs  Lab 10/02/22 0038 10/03/22 0820 10/04/22 0740 10/05/22 0633 10/06/22 0623  WBC 7.8 8.1 6.4 7.2 6.8  HGB 10.3* 9.8* 9.1* 8.9* 9.0*  HCT 32.3* 30.4* 29.1* 27.7* 27.6*  MCV 93.1 93.5 98.0 96.2 93.2  PLT 281 267 216 253 276    Basic Metabolic Panel: Recent Labs  Lab 10/02/22 0038 10/02/22 1629 10/03/22 0820 10/03/22 1747 10/04/22 0740 10/04/22 1723 10/05/22 0633 10/05/22 1641 10/06/22 0623  NA 140  --  139  --  137  --  136  --  137  K 3.2*  --  3.7  --  3.8  --  3.6  --  3.8  CL 101  --  106  --  103  --  100  --  102  CO2 28  --  27  --  23  --  25  --  27  GLUCOSE 115*  --  121*  --  132*  --  140*  --  156*  BUN 18  --  9  --  8  --  8  --  6*  CREATININE 0.68  --  0.43*  --  0.49  --  0.43*  --  0.44  CALCIUM 8.7*  --  8.6*  --  8.3*  --  8.3*  --  8.3*  MG 2.1   < > 1.9   < > 1.9 1.8 1.8 1.8 1.8  PHOS 3.2   < > 2.2*   < > 2.9 2.0* 1.9* 4.2 2.5   < > = values in this interval not displayed.    GFR: Estimated Creatinine Clearance: 50.4 mL/min (by C-G formula based on SCr of 0.44 mg/dL). Liver Function Tests: Recent Labs  Lab 09/30/22 0037  AST 25  ALT 19  ALKPHOS 65  BILITOT 0.7  PROT 7.0  ALBUMIN 3.3*    No results for input(s): "INR", "PROTIME" in the last 168 hours.  Recent Labs  Lab 10/05/22 1507 10/05/22 2004 10/05/22 2359 10/06/22 0406 10/06/22 0728  GLUCAP 95 137* 129* 122* 134*    Recent Results (from the past 240  hour(s))  MRSA Next Gen by PCR, Nasal     Status: None   Collection Time: 09/29/22  4:39 AM   Specimen: Nasal Mucosa; Nasal Swab  Result Value Ref Range Status   MRSA by PCR Next Gen NOT DETECTED NOT DETECTED Final    Comment: (NOTE) The GeneXpert MRSA Assay (FDA approved for NASAL specimens only), is one component of a comprehensive MRSA colonization surveillance program. It is not intended to diagnose MRSA infection nor  to guide or monitor treatment for MRSA infections. Test performance is not FDA approved in patients less than 44 years old. Performed at [facility], [address] [place], [place] [address]     Antimicrobials: Anti-infectives (From admission, onward)    None      Culture/Microbiology    Component Value Date/Time   SDES  08/15/2020 1610    BLOOD RIGHT ARM Performed at [facility], [address] [place], [place] [address]    SPECREQUEST  08/15/2020 1610    BOTTLES DRAWN AEROBIC AND ANAEROBIC Blood Culture results may not be optimal due to an inadequate volume of blood received in culture bottles Performed at [facility], [address] [place], [place] [address]    CULT  08/15/2020 1610    NO GROWTH 5 DAYS Performed at [facility], [address] [place], [place] [address]    REPTSTATUS 08/20/2020 FINAL 08/15/2020 1610  Radiology Studies: No results found.   LOS: 7 days   [name], MD [facility]  10/06/2022, 8:14 AM

## 2022-10-07 ENCOUNTER — Inpatient Hospital Stay (HOSPITAL_COMMUNITY): Payer: Medicare Other

## 2022-10-07 DIAGNOSIS — I4891 Unspecified atrial fibrillation: Secondary | ICD-10-CM | POA: Diagnosis not present

## 2022-10-07 DIAGNOSIS — Z66 Do not resuscitate: Secondary | ICD-10-CM

## 2022-10-07 DIAGNOSIS — R1319 Other dysphagia: Secondary | ICD-10-CM

## 2022-10-07 DIAGNOSIS — Z7401 Bed confinement status: Secondary | ICD-10-CM | POA: Diagnosis not present

## 2022-10-07 DIAGNOSIS — Z8673 Personal history of transient ischemic attack (TIA), and cerebral infarction without residual deficits: Secondary | ICD-10-CM | POA: Diagnosis not present

## 2022-10-07 DIAGNOSIS — E440 Moderate protein-calorie malnutrition: Secondary | ICD-10-CM | POA: Diagnosis not present

## 2022-10-07 DIAGNOSIS — I480 Paroxysmal atrial fibrillation: Secondary | ICD-10-CM | POA: Diagnosis not present

## 2022-10-07 LAB — BASIC METABOLIC PANEL
Anion gap: 7 (ref 5–15)
BUN: 7 mg/dL — ABNORMAL LOW (ref 8–23)
CO2: 28 mmol/L (ref 22–32)
Calcium: 8.4 mg/dL — ABNORMAL LOW (ref 8.9–10.3)
Chloride: 103 mmol/L (ref 98–111)
Creatinine, Ser: 0.47 mg/dL (ref 0.44–1.00)
GFR, Estimated: >60 mL/min (ref >60)
Glucose, Bld: 133 mg/dL — ABNORMAL HIGH (ref 70–99)
Potassium: 3.6 mmol/L (ref 3.5–5.1)
Sodium: 138 mmol/L (ref 135–145)

## 2022-10-07 LAB — CBC
HCT: 28.2 % — ABNORMAL LOW (ref 36.0–46.0)
Hemoglobin: 8.9 g/dL — ABNORMAL LOW (ref 12.0–15.0)
MCH: 30.1 pg (ref 26.0–34.0)
MCHC: 31.6 g/dL (ref 30.0–36.0)
MCV: 95.3 fL (ref 80.0–100.0)
Platelets: 272 10*3/uL (ref 150–400)
RBC: 2.96 MIL/uL — ABNORMAL LOW (ref 3.87–5.11)
RDW: 13.9 % (ref 11.5–15.5)
WBC: 6.9 10*3/uL (ref 4.0–10.5)
nRBC: 0 % (ref 0.0–0.2)

## 2022-10-07 LAB — GLUCOSE, CAPILLARY
Glucose-Capillary: 135 mg/dL — ABNORMAL HIGH (ref 70–99)
Glucose-Capillary: 127 mg/dL — ABNORMAL HIGH (ref 70–99)
Glucose-Capillary: 106 mg/dL — ABNORMAL HIGH (ref 70–99)
Glucose-Capillary: 116 mg/dL — ABNORMAL HIGH (ref 70–99)

## 2022-10-07 NOTE — Progress Notes (Signed)
PROGRESS NOTE Brenda Riggs  ZOX:[id number] DOB: 1955-11-04 DOA: 09/28/2022 PCP: [name], MD  Brief Narrative/Hospital Course: 67 yof w/ multiple medical comorbidities including asthma, diabetes mellitus type 2, hypertension, hyperlipidemia, ICH with residual dysarthria presented for worsening weakness and worsening speech difficulty on 09/28/22  In the ED:A-fib with RVR with HR 151.MRI brain - limited study but negative for acute intracranial abnormality. Neurology was consulted, felt likely recrudescence of old stroke symptoms in setting of underlying systemic infection. Possible small stroke in setting of A-fib. No anticoagulation recommended due to extremely high risk of hemorrhage in the brain because of presence of chronic microhemorrhages and prior ICH as per neurology.  Patient was admitted Managed with Cardizem drip for new onset A-fib.Echo 5/30:EF 65 to 70%, no RWMA moderate concentric LVH 5/30>some vomiting and no bowel movement since admission, x-ray abdomen multiple dilated loops of small bowel with decompressed colon possible SBO or ileus> General surgery was consulted> SBO resolved surgery signed off SLP following> recommended n.p.o. and alternative means of nutrition intervention core track tube feeding.  Palliative care following for goals of care discussion/PEG tube feeding   Subjective: Patient seen and examined She is more alert awake, using her suction to clear mouth Overnight remains afebrile BP stable Labs reviewed stable CBC BMP with chronic anemia Discussed w/ RN.-some diarrhea yesterday and needed rectal pouch overnight , otherwise no other issues  Assessment and Plan: Principal Problem:   Atrial fibrillation with RVR (HCC) Active Problems:   Type II diabetes mellitus (HCC)   Essential hypertension, benign   Asthma   Hypokalemia   History of stroke   Atrial fibrillation (HCC)   Malnutrition of moderate degree   Ileus versus SBO: resolve. seen by general  surgery, and signed off.No surgical need and advanced diet as  swallowing allows but NPO 2/2 dysphagia.having loose stool over right eating rectal polyps.  Abdomen soft.  Severe oral dysphagia Moderate malnutrition: Recent MBS:08/20/20 a severe oral and mild pharyngeal dysphagia mostly c/b decreased oral control from significant weakness> dysphagia diet with thin liquids was recommended.MBS also in 2021> dysphagia 3 with thin liquid was recommended MBS 6/3> n.p.o. recommended and remains on core track tube feeding and ivf pending further GOC by PM. SLP and RD following closely.   New onset WJX:BJYNWGNFA to NSR.Cardizem drip discontinued 6/1. No anticoagulation recommended due to risk of intracranial hemorrhage, echo with normal EF TSH stable.  Cardiology consult if recurrence of A-fib  Worsening weakness and dysarthria  History of stroke: Seen by neurology MRI brain unremarkable felt to be a recrudescence of old stroke symptoms.  Continue her statin.  Hypokalemia/hypophosphatemia/hypomagnesemia: Resolved   Type 2 diabetes mellitus blood sugar is well controlled on SSI. Recent Labs  Lab 10/06/22 1604 10/06/22 2014 10/06/22 2340 10/07/22 0417 10/07/22 0753  GLUCAP 106* 96 105* 135* 127*    Leukocytosis resolved.  OZH:YQMVHQIO, on ivf at 30 cc/hr and tube feed  Hypertension: BP well-controlled continue amlodipine  OSA continue nightly CPAP  NGE:XBMW low stable b12,fa-completed IV iron x 5 days.  Hemoglobin Recent Labs  Lab 10/03/22 0820 10/04/22 0740 10/05/22 0633 10/06/22 0623 10/07/22 0101  HGB 9.8* 9.1* 8.9* 9.0* 8.9*  HCT 30.4* 29.1* 27.7* 27.6* 28.2*    Failure to thrive/deconditioning complex comorbidities with dysphagia stroke and malnutrition moderate PMT consulted and planning on family meeting 10/07/22 for further goals of care. Consider DNR/DNI understanding evidenced-based poor outcomes in similar hospitalized ptient, as a cause of arrest is likely associated with  advanced chronic illness  rather than easily reversible acute cardiopulmonary. She has complex medical comorbidity and prognosis does not appear bright.  Moderate malnutrition augment diet as below Nutrition Problem: Moderate Malnutrition Etiology: chronic illness Signs/Symptoms: mild fat depletion, mild muscle depletion Interventions: Tube feeding, Refer to RD note for recommendations   DVT prophylaxis: enoxaparin (LOVENOX) injection 40 mg Start: 09/29/22 1000 Code Status:   Code Status: Full Code Family Communication: plan of care discussed with patient at bedside. Patient status is:  inpatient  because of  dysphagia Level of care: Progressive   Dispo: The patient is from:snf            Anticipated disposition: tbd Objective: Vitals last 24 hrs: Vitals:   10/06/22 2328 10/06/22 2339 10/07/22 0417 10/07/22 0727  BP:  118/60 (!) 162/66 (!) 145/81  Pulse:   73 72  Resp: 17 14 15 18   Temp:  98.6 F (37 C) 97.6 F (36.4 C) 97.8 F (36.6 C)  TempSrc:  Oral Oral Oral  SpO2:  99% 100% 100%  Weight:   54 kg   Height:       Weight change: -1.814 kg  Physical Examination: General exam: AA oriented, follows commands has baseline dysarthria, weak,older appearing HEENT:Oral mucosa moist, Ear/Nose WNL grossly, dentition normal. Respiratory system: bilaterally clear BS, no use of accessory muscle Cardiovascular system: S1 & S2 +, regular rate. Gastrointestinal system: Abdomen soft, nontender bowel sounds present, appears full.  Nervous System:Alert, awake, weakness all over more on left side Extremities: LE ankle edema neg, lower extremities warm Skin: No rashes,no icterus. MSK: Normal muscle bulk,tone, power  Cortrak+  Medications reviewed:  Scheduled Meds:  amLODipine  5 mg Per NG tube Daily   atorvastatin  40 mg Oral q1800   budesonide (PULMICORT) nebulizer solution  0.25 mg Nebulization BID   diclofenac Sodium  2 g Topical QID   enoxaparin (LOVENOX) injection  40 mg  Subcutaneous Daily   feeding supplement (PROSource TF20)  60 mL Per Tube Daily   fluticasone  1 spray Each Nare Daily   free water  110 mL Per Tube Q4H   insulin aspart  0-6 Units Subcutaneous Q4H   iron polysaccharides  150 mg Oral Daily   metoprolol tartrate  50 mg Per NG tube BID   mouth rinse  15 mL Mouth Rinse 4 times per day   pantoprazole (PROTONIX) IV  40 mg Intravenous Q24H   sodium chloride flush  3 mL Intravenous Q12H   Continuous Infusions:  dextrose 5 % and 0.9 % NaCl with KCl 20 mEq/L 30 mL/hr at 10/07/22 0641   feeding supplement (OSMOLITE 1.5 CAL) 1,000 mL (10/06/22 0525)   Diet Order             Diet NPO time specified  Diet effective now                  Intake/Output Summary (Last 24 hours) at 10/07/2022 0859 Last data filed at 10/07/2022 0420 Gross per 24 hour  Intake 0 ml  Output 1300 ml  Net -1300 ml   Net IO Since Admission: -272.71 mL [10/07/22 0859]  Wt Readings from Last 3 Encounters:  10/07/22 54 kg  08/18/20 54 kg  03/28/17 61.2 kg     Unresulted Labs (From admission, onward)     Start     Ordered   10/06/22 0500  Creatinine, serum  (enoxaparin (LOVENOX)    CrCl >/= 30 ml/min)  Weekly,   R     Comments: while  on enoxaparin therapy    09/29/22 0237   10/04/22 0500  CBC  Daily,   R     Question:  Specimen collection method  Answer:  Lab=Lab collect   10/03/22 0843   10/04/22 0500  Basic metabolic panel  Daily,   R     Question:  Specimen collection method  Answer:  Lab=Lab collect   10/03/22 0843          Data Reviewed: I have personally reviewed following labs and imaging studies CBC: Recent Labs  Lab 10/03/22 0820 10/04/22 0740 10/05/22 0633 10/06/22 0623 10/07/22 0101  WBC 8.1 6.4 7.2 6.8 6.9  HGB 9.8* 9.1* 8.9* 9.0* 8.9*  HCT 30.4* 29.1* 27.7* 27.6* 28.2*  MCV 93.5 98.0 96.2 93.2 95.3  PLT 267 216 253 276 272   Basic Metabolic Panel: Recent Labs  Lab 10/03/22 0820 10/03/22 1747 10/04/22 0740 10/04/22 1723  10/05/22 0633 10/05/22 1641 10/06/22 0623 10/07/22 0101  NA 139  --  137  --  136  --  137 138  K 3.7  --  3.8  --  3.6  --  3.8 3.6  CL 106  --  103  --  100  --  102 103  CO2 27  --  23  --  25  --  27 28  GLUCOSE 121*  --  132*  --  140*  --  156* 133*  BUN 9  --  8  --  8  --  6* 7*  CREATININE 0.43*  --  0.49  --  0.43*  --  0.44 0.47  CALCIUM 8.6*  --  8.3*  --  8.3*  --  8.3* 8.4*  MG 1.9   < > 1.9 1.8 1.8 1.8 1.8  --   PHOS 2.2*   < > 2.9 2.0* 1.9* 4.2 2.5  --    < > = values in this interval not displayed.   GFR: Estimated Creatinine Clearance: 49.6 mL/min (by C-G formula based on SCr of 0.47 mg/dL). Liver Function Tests: No results for input(s): "AST", "ALT", "ALKPHOS", "BILITOT", "PROT", "ALBUMIN" in the last 168 hours.  No results for input(s): "INR", "PROTIME" in the last 168 hours.  Recent Labs  Lab 10/06/22 1604 10/06/22 2014 10/06/22 2340 10/07/22 0417 10/07/22 0753  GLUCAP 106* 96 105* 135* 127*   Recent Results (from the past 240 hour(s))  MRSA Next Gen by PCR, Nasal     Status: None   Collection Time: 09/29/22  4:39 AM   Specimen: Nasal Mucosa; Nasal Swab  Result Value Ref Range Status   MRSA by PCR Next Gen NOT DETECTED NOT DETECTED Final    Comment: (NOTE) The GeneXpert MRSA Assay (FDA approved for NASAL specimens only), is one component of a comprehensive MRSA colonization surveillance program. It is not intended to diagnose MRSA infection nor to guide or monitor treatment for MRSA infections. Test performance is not FDA approved in patients less than 60 years old. Performed at [facility], [address], [place], [place] [address]     Antimicrobials: Anti-infectives (From admission, onward)    None      Culture/Microbiology    Component Value Date/Time   SDES  08/15/2020 1610    BLOOD RIGHT ARM Performed at [facility], [address] [place], [place] [address]    SPECREQUEST  08/15/2020 1610    BOTTLES DRAWN  AEROBIC AND ANAEROBIC Blood Culture results may not be optimal due to  an inadequate volume of blood received in culture bottles Performed at [facility], [address], [place], [place] [address]    CULT  08/15/2020 1610    NO GROWTH 5 DAYS Performed at [facility], [address] [place], [place] [address]    REPTSTATUS 08/20/2020 FINAL 08/15/2020 1610  Radiology Studies: No results found.   LOS: 8 days   [name], MD [facility]  10/07/2022, 8:59 AM

## 2022-10-07 NOTE — Progress Notes (Signed)
Patient ID: Brenda Riggs, female   DOB: Sep 02, 1955, 67 y.o.   MRN: [id number]    Progress Note from the Palliative Medicine Team at [facility]   Patient Name: Brenda Riggs        Date: 10/07/2022 DOB: [birth year]  Age: 67 y.o. MRN#: [id number] Attending Physician: Brenda Boast, MD Primary Care Physician: Brenda Plum, MD Admit Date: 09/28/2022   Medical records reviewed   67 y.o. female   admitted on 09/28/2022 with past medical history significant for asthma, type 2 diabetes mellitus, hypertension, hyperlipidemia, and ICH with residual dysarthria who presents for evaluation of worsened weakness and worsened speech difficulty.  New onset A-fib   Per report of the patient's facility, according to social work note she is a resident of [facility]. Facility transferred patient to the emergency room with increasing weakness.  Most recent MBS complete 08/20/20 indicated a severe oral and mild pharyngeal dysphagia mostly c/b decreased oral control from significant weakness, with recommendations for dysphagia 1 with thin liquid. MBs also in 2021 with recommendations for dys 3, thin liquid.    Family report continued physical and functional decline over the past many months.   Patient requires assistance with ADLs, is essentially bedbound, and has chronic dysarthria, dysphagia, and weakness.  Today is day 8 of this hospitalization.  She  continues with core track for nutritional support.  She remains high risk for aspiration  Patient is alert, communication is difficult 2/2 dysarthria.    This NP assessed patient at the bedside as a follow up for palliative and needs and emotional support and to meet with her daughter as scheduled for ongoing conversation regarding treatment option decisions, advanced directive decisions and anticipatory care needs.  Spoke to patient's daughter/ Brenda Riggs at bedside.    Education offered on current medical situation and multiple  comorbidities.  Yecola verbalizes clear understanding of her mother's current medical situation.  She shares briefly her mother's "traumatic life".  She diagnosis of schizophrenia, having been institutionalized at [facility], and that she has lived in a group home for skilled facility for well over 15 years.  She reports continued physical, functional decline  over that time period.   Education offered on risks and benefits of artificial feeding via PEG.  Occasion offered on her continued high risk for aspiration  Education offered on the difference between a full medical support path versus a comfort path.  Concept of comfort feeds with known risk of aspiration detailed  Plan of care:  -DNR/DNI-documented today -Patient and family wish to pursue PEG placement for artificial feeding and hydration -When medically stable transition back to [facility] skilled nursing facility  MOST form completed  Education offered today regarding  the importance of continued conversation with family and the  medical providers regarding overall plan of care and treatment options,  ensuring decisions are within the context of the patients values and GOCs.  This nurse practitioner informed  the patient/family and the attending that I will be out of the hospital next.  A PMT provider will be following for needs  Call palliative medicine team phone # [phone] with questions or concerns.   Questions and concerns addressed   Discussed with Dr Jonathon Bellows and bedside RN via secure chat  Time: 66   minutes   Detailed review of medical records ( labs, imaging, vital signs), medically appropriate exam ( MS, skin, resp)   discussed with treatment team, counseling and education to patient, family, staff, documenting clinical information,  medication management, coordination of care    Brenda Creed NP  Palliative Medicine Team Team Phone # [phone] Pager [phone]

## 2022-10-08 DIAGNOSIS — I480 Paroxysmal atrial fibrillation: Secondary | ICD-10-CM | POA: Diagnosis not present

## 2022-10-08 DIAGNOSIS — I4891 Unspecified atrial fibrillation: Secondary | ICD-10-CM | POA: Diagnosis not present

## 2022-10-08 LAB — BASIC METABOLIC PANEL
Anion gap: 8 (ref 5–15)
BUN: 10 mg/dL (ref 8–23)
CO2: 27 mmol/L (ref 22–32)
Calcium: 8.7 mg/dL — ABNORMAL LOW (ref 8.9–10.3)
Chloride: 103 mmol/L (ref 98–111)
Creatinine, Ser: 0.43 mg/dL — ABNORMAL LOW (ref 0.44–1.00)
GFR, Estimated: >60 mL/min (ref >60)
Glucose, Bld: 110 mg/dL — ABNORMAL HIGH (ref 70–99)
Potassium: 3.6 mmol/L (ref 3.5–5.1)
Sodium: 138 mmol/L (ref 135–145)

## 2022-10-08 LAB — CBC
HCT: 31 % — ABNORMAL LOW (ref 36.0–46.0)
Hemoglobin: 9.9 g/dL — ABNORMAL LOW (ref 12.0–15.0)
MCH: 30.9 pg (ref 26.0–34.0)
MCHC: 31.9 g/dL (ref 30.0–36.0)
MCV: 96.9 fL (ref 80.0–100.0)
Platelets: 289 10*3/uL (ref 150–400)
RBC: 3.2 MIL/uL — ABNORMAL LOW (ref 3.87–5.11)
RDW: 13.8 % (ref 11.5–15.5)
WBC: 6.5 10*3/uL (ref 4.0–10.5)
nRBC: 0 % (ref 0.0–0.2)

## 2022-10-08 LAB — GLUCOSE, CAPILLARY
Glucose-Capillary: 110 mg/dL — ABNORMAL HIGH (ref 70–99)
Glucose-Capillary: 112 mg/dL — ABNORMAL HIGH (ref 70–99)
Glucose-Capillary: 113 mg/dL — ABNORMAL HIGH (ref 70–99)
Glucose-Capillary: 119 mg/dL — ABNORMAL HIGH (ref 70–99)
Glucose-Capillary: 130 mg/dL — ABNORMAL HIGH (ref 70–99)
Glucose-Capillary: 140 mg/dL — ABNORMAL HIGH (ref 70–99)
Glucose-Capillary: 149 mg/dL — ABNORMAL HIGH (ref 70–99)

## 2022-10-08 NOTE — Progress Notes (Signed)
PROGRESS NOTE Brenda Riggs  UEA:[id number] DOB: 11-05-1955 DOA: 09/28/2022 PCP: [name], MD  Brief Narrative/Hospital Course: As per prior documentation by [name], MD: "73 yof w/ multiple medical comorbidities including asthma, diabetes mellitus type 2, hypertension, hyperlipidemia, ICH with residual dysarthria presented for worsening weakness and worsening speech difficulty on 09/28/22  In the ED:A-fib with RVR with HR 151.MRI brain - limited study but negative for acute intracranial abnormality. Neurology was consulted, felt likely recrudescence of old stroke symptoms in setting of underlying systemic infection. Possible small stroke in setting of A-fib. No anticoagulation recommended due to extremely high risk of hemorrhage in the brain because of presence of chronic microhemorrhages and prior ICH as per neurology.  Patient was admitted Managed with Cardizem drip for new onset A-fib.Echo 5/30:EF 65 to 70%, no RWMA moderate concentric LVH 5/30>some vomiting and no bowel movement since admission, x-ray abdomen multiple dilated loops of small bowel with decompressed colon possible SBO or ileus> General surgery was consulted> SBO resolved surgery signed off SLP following> recommended n.p.o. and alternative means of nutrition intervention core track tube feeding.  Palliative care following for goals of care discussion/PEG tube feeding"  10/08/2022: Patient seen alongside patient's nurse.  No significant history from patient.  Patient's nurse tells me that he is planning to place a PEG tube.  Subjective: Patient seen and examined No significant history from previously.   Assessment and Plan: Principal Problem:   Atrial fibrillation with RVR (HCC) Active Problems:   Type II diabetes mellitus (HCC)   Essential hypertension, benign   Asthma   Hypokalemia   History of stroke   Atrial fibrillation (HCC)   Malnutrition of moderate degree   Ileus versus SBO: -Resolved. -Patient is  currently on tube feeds.   Severe oral dysphagia Moderate malnutrition: Recent MBS:08/20/20 a severe oral and mild pharyngeal dysphagia mostly c/b decreased oral control from significant weakness> dysphagia diet with thin liquids was recommended.MBS also in 2021> dysphagia 3 with thin liquid was recommended MBS 6/3> n.p.o. recommended and remains on core track tube feeding and ivf pending further GOC by PM. SLP and RD following closely.  10/08/2022: Continue tube feeds for now.  Palliative care input is appreciated.  New onset YNW:[id number] to NSR.Cardizem drip discontinued 6/1. No anticoagulation recommended due to risk of intracranial hemorrhage, echo with normal EF TSH stable.  Cardiology consult if recurrence of A-fib  Worsening weakness and dysarthria  History of stroke: Seen by neurology MRI brain unremarkable felt to be a recrudescence of old stroke symptoms.  Continue her statin. 10/08/2022: Palliative care team is discussing with family.  Hypokalemia/hypophosphatemia/hypomagnesemia: Resolved   Type 2 diabetes mellitus blood sugar is well controlled on SSI. Recent Labs  Lab 10/07/22 1118 10/07/22 1636 10/08/22 0214 10/08/22 0511 10/08/22 0752  GLUCAP 106* 116* 112* 113* 140*     Leukocytosis resolved.  AKI: -Resolved.   -On IV fluid at 30 cc/h.  Patient is also on tube feeds.    Hypertension:  -Reasonably controlled. -Continue to optimize.     OSA continue nightly CPAP  MVH:QION low stable b12,fa-completed IV iron x 5 days.  Hemoglobin Recent Labs  Lab 10/04/22 0740 10/05/22 6295 10/06/22 0623 10/07/22 0101 10/08/22 0055  HGB 9.1* 8.9* 9.0* 8.9* 9.9*  HCT 29.1* 27.7* 27.6* 28.2* 31.0*     Failure to thrive/deconditioning complex comorbidities with dysphagia stroke and malnutrition moderate PMT consulted and planning on family meeting 10/07/22 for further goals of care. Consider DNR/DNI understanding evidenced-based poor outcomes in similar  hospitalized ptient, as  a cause of arrest is likely associated with advanced chronic illness rather than easily reversible acute cardiopulmonary. She has complex medical comorbidity and prognosis does not appear bright.  Moderate malnutrition augment diet as below Nutrition Problem: Moderate Malnutrition Etiology: chronic illness Signs/Symptoms: mild fat depletion, mild muscle depletion Interventions: Tube feeding, Refer to RD note for recommendations   DVT prophylaxis: enoxaparin (LOVENOX) injection 40 mg Start: 09/29/22 1000 Code Status:   Code Status: DNR Family Communication: plan of care discussed with patient at bedside. Patient status is:  inpatient  because of  dysphagia Level of care: Progressive   Dispo: The patient is from:snf            Anticipated disposition: tbd Objective: Vitals last 24 hrs: Vitals:   10/07/22 1639 10/07/22 1941 10/08/22 0230 10/08/22 0612  BP: (!) 141/56 131/67 (!) 144/60 (!) 151/71  Pulse: 73 80 71 70  Resp: 16 16 18 17   Temp: 98.7 F (37.1 C) (!) 97.5 F (36.4 C) 98 F (36.7 C) 97.6 F (36.4 C)  TempSrc: Oral Oral Oral Oral  SpO2: 100% 100% 100% 100%  Weight:    54.9 kg  Height:       Weight change: 0.907 kg  Physical Examination: General exam: Chronically ill looking.  Dysarthric.  Left-sided weakness.   HEENT: Patient is pale.  No jaundice.   Respiratory system: Decreased air entry globally.   Cardiovascular system: S1 & S2. Gastrointestinal system: Abdomen is soft and nontender.   Nervous System: Awake and alert.  Left-sided weakness.  Extremities: LE ankle edema neg, lower extremities warm    Cortrak+  Medications reviewed:  Scheduled Meds:  amLODipine  5 mg Per NG tube Daily   atorvastatin  40 mg Oral q1800   budesonide (PULMICORT) nebulizer solution  0.25 mg Nebulization BID   diclofenac Sodium  2 g Topical QID   enoxaparin (LOVENOX) injection  40 mg Subcutaneous Daily   feeding supplement (PROSource TF20)  60 mL Per Tube Daily   fluticasone  1  spray Each Nare Daily   free water  110 mL Per Tube Q4H   insulin aspart  0-6 Units Subcutaneous Q4H   iron polysaccharides  150 mg Oral Daily   metoprolol tartrate  50 mg Per NG tube BID   mouth rinse  15 mL Mouth Rinse 4 times per day   pantoprazole (PROTONIX) IV  40 mg Intravenous Q24H   sodium chloride flush  3 mL Intravenous Q12H   Continuous Infusions:  dextrose 5 % and 0.9 % NaCl with KCl 20 mEq/L 30 mL/hr at 10/08/22 0800   feeding supplement (OSMOLITE 1.5 CAL) 1,000 mL (10/07/22 1512)   Diet Order             Diet NPO time specified  Diet effective now                  Intake/Output Summary (Last 24 hours) at 10/08/2022 0805 Last data filed at 10/08/2022 0616 Gross per 24 hour  Intake 200 ml  Output 1900 ml  Net -1700 ml    Net IO Since Admission: -1,972.71 mL [10/08/22 0805]  Wt Readings from Last 3 Encounters:  10/08/22 54.9 kg  08/18/20 54 kg  03/28/17 61.2 kg     Unresulted Labs (From admission, onward)     Start     Ordered   10/06/22 0500  Creatinine, serum  (enoxaparin (LOVENOX)    CrCl >/= 30 ml/min)  Weekly,  R     Comments: while on enoxaparin therapy    09/29/22 0237   10/04/22 0500  CBC  Daily,   R     Question:  Specimen collection method  Answer:  Lab=Lab collect   10/03/22 0843   10/04/22 0500  Basic metabolic panel  Daily,   R     Question:  Specimen collection method  Answer:  Lab=Lab collect   10/03/22 0843          Data Reviewed: I have personally reviewed following labs and imaging studies CBC: Recent Labs  Lab 10/04/22 0740 10/05/22 0633 10/06/22 0623 10/07/22 0101 10/08/22 0055  WBC 6.4 7.2 6.8 6.9 6.5  HGB 9.1* 8.9* 9.0* 8.9* 9.9*  HCT 29.1* 27.7* 27.6* 28.2* 31.0*  MCV 98.0 96.2 93.2 95.3 96.9  PLT 216 253 276 272 289    Basic Metabolic Panel: Recent Labs  Lab 10/04/22 0740 10/04/22 1723 10/05/22 0633 10/05/22 1641 10/06/22 0623 10/07/22 0101 10/08/22 0055  NA 137  --  136  --  137 138 138  K 3.8  --   3.6  --  3.8 3.6 3.6  CL 103  --  100  --  102 103 103  CO2 23  --  25  --  27 28 27   GLUCOSE 132*  --  140*  --  156* 133* 110*  BUN 8  --  8  --  6* 7* 10  CREATININE 0.49  --  0.43*  --  0.44 0.47 0.43*  CALCIUM 8.3*  --  8.3*  --  8.3* 8.4* 8.7*  MG 1.9 1.8 1.8 1.8 1.8  --   --   PHOS 2.9 2.0* 1.9* 4.2 2.5  --   --     GFR: Estimated Creatinine Clearance: 49.9 mL/min (A) (by C-G formula based on SCr of 0.43 mg/dL (L)). Liver Function Tests: No results for input(s): "AST", "ALT", "ALKPHOS", "BILITOT", "PROT", "ALBUMIN" in the last 168 hours.  No results for input(s): "INR", "PROTIME" in the last 168 hours.  Recent Labs  Lab 10/07/22 1118 10/07/22 1636 10/08/22 0214 10/08/22 0511 10/08/22 0752  GLUCAP 106* 116* 112* 113* 140*    Recent Results (from the past 240 hour(s))  MRSA Next Gen by PCR, Nasal     Status: None   Collection Time: 09/29/22  4:39 AM   Specimen: Nasal Mucosa; Nasal Swab  Result Value Ref Range Status   MRSA by PCR Next Gen NOT DETECTED NOT DETECTED Final    Comment: (NOTE) The GeneXpert MRSA Assay (FDA approved for NASAL specimens only), is one component of a comprehensive MRSA colonization surveillance program. It is not intended to diagnose MRSA infection nor to guide or monitor treatment for MRSA infections. Test performance is not FDA approved in patients less than 81 years old. Performed at [facility], [address], [place], [place] [address]     Antimicrobials: Anti-infectives (From admission, onward)    None      Culture/Microbiology    Component Value Date/Time   SDES  08/15/2020 1610    BLOOD RIGHT ARM Performed at [facility], [address] [place], [place] [address]    SPECREQUEST  08/15/2020 1610    BOTTLES DRAWN AEROBIC AND ANAEROBIC Blood Culture results may not be optimal due to an inadequate volume of blood received in culture bottles Performed at [facility], [address],  [place], [place] [address]    CULT  08/15/2020 1610  NO GROWTH 5 DAYS Performed at [facility], [address] [place], [place] [address]    REPTSTATUS 08/20/2020 FINAL 08/15/2020 1610  Radiology Studies: CT ABDOMEN WO CONTRAST  Result Date: 10/07/2022 CLINICAL DATA:  Evaluate anatomy prior to potential percutaneous gastrostomy tube placement. EXAM: CT ABDOMEN WITHOUT CONTRAST TECHNIQUE: Multidetector CT imaging of the abdomen was performed following the standard protocol without IV contrast. RADIATION DOSE REDUCTION: This exam was performed according to the departmental dose-optimization program which includes automated exposure control, adjustment of the mA and/or kV according to patient size and/or use of iterative reconstruction technique. COMPARISON:  None Available. FINDINGS: The lack of intravenous contrast limits the ability to evaluate solid abdominal organs Lower chest: Limited evaluation of the lower thorax demonstrates minimal bibasilar dependent subpleural ground-glass atelectasis, right-greater-than-left. Normal heart size. Coronary artery calcifications. No pericardial effusion. Hepatobiliary: Normal hepatic contour. Normal noncontrast appearance of the gallbladder given degree distention. No radiopaque gallstones. No ascites. Pancreas: Normal noncontrast appearance of the pancreas. Spleen: Normal noncontrast appearance of the spleen. Adrenals/Urinary Tract: There is a punctate (3 mm) nonobstructing stone within the superior pole of the right kidney (image 29, series 3). No evidence of left-sided nephrolithiasis. No urinary obstruction or perinephric stranding. Normal noncontrast appearance of the bilateral adrenal glands. The urinary bladder was not imaged. Stomach/Bowel: The anterior wall of the stomach is well apposed against the ventral wall of the upper abdomen without interposition of the hepatic parenchyma or transverse colon. Weighted tip enteric tube terminates within the gastric  antrum/duodenal bulb. Radiopaque material is seen within the descending and sigmoid colon. No pneumoperitoneum, pneumatosis or portal venous gas. Vascular/Lymphatic: Atherosclerotic plaque within a normal caliber abdominal aorta. No bulky retroperitoneal or mesenteric lymphadenopathy on this noncontrast examination. Other: Subcutaneous emphysema involving the right lower abdominal pannus likely at the location of subcutaneous medication administration. Dermal calcifications are noted about the buttocks bilaterally. Musculoskeletal: No acute or aggressive osseous abnormalities. Grade 1 anterolisthesis of L4 upon L5 without associated pars defects. Mild-to-moderate multilevel lumbar spine DDD, worse at L2-L3 and L3-L4 with disc space height loss, endplate irregularity and sclerosis. Stigmata of dish within the lower thoracic spine. IMPRESSION: 1. Gastric anatomy amenable to attempted percutaneous gastrostomy tube placement as indicated. 2. Punctate (3 mm) nonobstructing right-sided renal stone. 3. Coronary artery calcifications. Aortic Atherosclerosis (ICD10-I70.0). Electronically Signed   By: Simonne Come M.D.   On: 10/07/2022 15:11     LOS: 9 days   Barnetta Chapel, MD [facility]  10/08/2022, 8:05 AM

## 2022-10-09 DIAGNOSIS — I4891 Unspecified atrial fibrillation: Secondary | ICD-10-CM | POA: Diagnosis not present

## 2022-10-09 DIAGNOSIS — I480 Paroxysmal atrial fibrillation: Secondary | ICD-10-CM | POA: Diagnosis not present

## 2022-10-09 LAB — BASIC METABOLIC PANEL
Anion gap: 6 (ref 5–15)
BUN: 11 mg/dL (ref 8–23)
CO2: 26 mmol/L (ref 22–32)
Calcium: 8.5 mg/dL — ABNORMAL LOW (ref 8.9–10.3)
Chloride: 103 mmol/L (ref 98–111)
Creatinine, Ser: 0.49 mg/dL (ref 0.44–1.00)
GFR, Estimated: >60 mL/min (ref >60)
Glucose, Bld: 132 mg/dL — ABNORMAL HIGH (ref 70–99)
Potassium: 3.8 mmol/L (ref 3.5–5.1)
Sodium: 135 mmol/L (ref 135–145)

## 2022-10-09 LAB — GLUCOSE, CAPILLARY
Glucose-Capillary: 147 mg/dL — ABNORMAL HIGH (ref 70–99)
Glucose-Capillary: 150 mg/dL — ABNORMAL HIGH (ref 70–99)
Glucose-Capillary: 129 mg/dL — ABNORMAL HIGH (ref 70–99)
Glucose-Capillary: 112 mg/dL — ABNORMAL HIGH (ref 70–99)
Glucose-Capillary: 129 mg/dL — ABNORMAL HIGH (ref 70–99)

## 2022-10-09 LAB — CBC
HCT: 30.9 % — ABNORMAL LOW (ref 36.0–46.0)
Hemoglobin: 9.8 g/dL — ABNORMAL LOW (ref 12.0–15.0)
MCH: 30.5 pg (ref 26.0–34.0)
MCHC: 31.7 g/dL (ref 30.0–36.0)
MCV: 96.3 fL (ref 80.0–100.0)
Platelets: 320 10*3/uL (ref 150–400)
RBC: 3.21 MIL/uL — ABNORMAL LOW (ref 3.87–5.11)
RDW: 13.9 % (ref 11.5–15.5)
WBC: 9.8 10*3/uL (ref 4.0–10.5)
nRBC: 0 % (ref 0.0–0.2)

## 2022-10-09 MED ORDER — GUAIFENESIN 100 MG/5ML PO LIQD
5.0000 mL | ORAL | Status: DC | PRN
  Administered 2022-10-11: 5 mL
  Filled 2022-10-09: qty 10

## 2022-10-09 MED ORDER — ACETAMINOPHEN 160 MG/5ML PO SOLN
650.0000 mg | Freq: Four times a day (QID) | ORAL | Status: DC | PRN
  Administered 2022-10-09 – 2022-10-11 (×3): 650 mg
  Filled 2022-10-09 (×4): qty 20.3

## 2022-10-09 MED ORDER — ACETAMINOPHEN 650 MG RE SUPP: 650.0000 mg | Freq: Four times a day (QID) | RECTAL | Status: DC | PRN

## 2022-10-09 MED ORDER — ATORVASTATIN CALCIUM 40 MG PO TABS: 40.0000 mg | ORAL_TABLET | Freq: Every day | ORAL | Status: DC

## 2022-10-09 MED ORDER — POLYSACCHARIDE IRON COMPLEX 150 MG PO CAPS
150.0000 mg | ORAL_CAPSULE | Freq: Every day | ORAL | Status: DC
  Administered 2022-10-10 – 2022-10-11 (×2): 150 mg
  Filled 2022-10-09 (×2): qty 1

## 2022-10-09 MED ORDER — ENOXAPARIN SODIUM 40 MG/0.4ML IJ SOSY
40.0000 mg | PREFILLED_SYRINGE | Freq: Every day | INTRAMUSCULAR | Status: DC
  Administered 2022-10-11: 40 mg via SUBCUTANEOUS
  Filled 2022-10-09: qty 0.4

## 2022-10-09 NOTE — Progress Notes (Signed)
PROGRESS NOTE Brenda Riggs  ZOX:[id number] DOB: 1956/03/30 DOA: 09/28/2022 PCP: [name], MD  Brief Narrative/Hospital Course: As per prior documentation by [name], MD: "67 yof w/ multiple medical comorbidities including asthma, diabetes mellitus type 2, hypertension, hyperlipidemia, ICH with residual dysarthria presented for worsening weakness and worsening speech difficulty on 09/28/22  In the ED:A-fib with RVR with HR 151.MRI brain - limited study but negative for acute intracranial abnormality. Neurology was consulted, felt likely recrudescence of old stroke symptoms in setting of underlying systemic infection. Possible small stroke in setting of A-fib. No anticoagulation recommended due to extremely high risk of hemorrhage in the brain because of presence of chronic microhemorrhages and prior ICH as per neurology.  Patient was admitted Managed with Cardizem drip for new onset A-fib.Echo 5/30:EF 65 to 70%, no RWMA moderate concentric LVH 5/30>some vomiting and no bowel movement since admission, x-ray abdomen multiple dilated loops of small bowel with decompressed colon possible SBO or ileus> General surgery was consulted> SBO resolved surgery signed off SLP following> recommended n.p.o. and alternative means of nutrition intervention core track tube feeding.  Palliative care following for goals of care discussion/PEG tube feeding"  10/08/2022: Patient seen alongside patient's nurse.  No significant history from patient.  Patient's nurse tells me that he is planning to place a PEG tube.  10/09/2022: Interventional radiology input is appreciated.  Patient's family has opted for PEG tube placement.  Otherwise, no new changes.  Subjective: No significant history from previously.   Assessment and Plan: Principal Problem:   Atrial fibrillation with RVR (HCC) Active Problems:   Type II diabetes mellitus (HCC)   Essential hypertension, benign   Asthma   Hypokalemia   History of  stroke   Atrial fibrillation (HCC)   Malnutrition of moderate degree   Ileus versus SBO: -Resolved. -Patient is currently on tube feeds.   Severe oral dysphagia Moderate malnutrition: Recent MBS:08/20/20 a severe oral and mild pharyngeal dysphagia mostly c/b decreased oral control from significant weakness> dysphagia diet with thin liquids was recommended.MBS also in 2021> dysphagia 3 with thin liquid was recommended MBS 6/3> n.p.o. recommended and remains on core track tube feeding and ivf pending further GOC by PM. SLP and RD following closely.  10/08/2022: Continue tube feeds for now.  Palliative care input is appreciated. 10/09/2022: For PEG tube placement.  Guarded prognosis.  New onset WJX:[id number] to NSR.Cardizem drip discontinued 6/1. No anticoagulation recommended due to risk of intracranial hemorrhage, echo with normal EF TSH stable.  Cardiology consult if recurrence of A-fib 10/10/2022: Heart rate is controlled.  Worsening weakness and dysarthria  History of stroke: Seen by neurology MRI brain unremarkable felt to be a recrudescence of old stroke symptoms.  Continue her statin. 10/09/2022: Palliative care team is discussing with family.  Hypokalemia/hypophosphatemia/hypomagnesemia: Resolved   Type 2 diabetes mellitus blood sugar is well controlled on SSI. Recent Labs  Lab 10/08/22 2357 10/09/22 0405 10/09/22 0807 10/09/22 1054 10/09/22 1638  GLUCAP 130* 147* 150* 129* 112*     Leukocytosis resolved.  AKI: -Resolved.   -On IV fluid at 30 cc/h.  Patient is also on tube feeds.    Hypertension:  -Reasonably controlled. -Continue to optimize.     OSA continue nightly CPAP  OZH:YQMV low stable b12,fa-completed IV iron x 5 days.  Hemoglobin Recent Labs  Lab 10/05/22 0633 10/06/22 0623 10/07/22 0101 10/08/22 0055 10/09/22 0101  HGB 8.9* 9.0* 8.9* 9.9* 9.8*  HCT 27.7* 27.6* 28.2* 31.0* 30.9*     Failure to  thrive/deconditioning complex comorbidities with  dysphagia stroke and malnutrition moderate PMT consulted and planning on family meeting 10/07/22 for further goals of care. Consider DNR/DNI understanding evidenced-based poor outcomes in similar hospitalized ptient, as a cause of arrest is likely associated with advanced chronic illness rather than easily reversible acute cardiopulmonary. She has complex medical comorbidity and prognosis does not appear bright.  Moderate malnutrition augment diet as below Nutrition Problem: Moderate Malnutrition Etiology: chronic illness Signs/Symptoms: mild fat depletion, mild muscle depletion Interventions: Tube feeding, Refer to RD note for recommendations   DVT prophylaxis: enoxaparin (LOVENOX) injection 40 mg Start: 10/11/22 1000 Code Status:   Code Status: DNR Family Communication: plan of care discussed with patient at bedside. Patient status is:  inpatient  because of  dysphagia Level of care: Progressive   Dispo: The patient is from:snf            Anticipated disposition: tbd Objective: Vitals last 24 hrs: Vitals:   10/09/22 0444 10/09/22 0731 10/09/22 0809 10/09/22 0850  BP: (!) 123/53 (!) 154/46 (!) 132/55   Pulse: 75 73 74 76  Resp:  (!) 21 20 16   Temp: 98.3 F (36.8 C) 98.1 F (36.7 C) 98.1 F (36.7 C)   TempSrc: Oral Axillary Oral   SpO2: 99%  100% 100%  Weight: 53.5 kg     Height:       Weight change: -1.361 kg  Physical Examination: General exam: Chronically ill looking.  Dysarthric.  Left-sided weakness.   HEENT: Patient is pale.  No jaundice.   Respiratory system: Decreased air entry globally.   Cardiovascular system: S1 & S2. Gastrointestinal system: Abdomen is soft and nontender.   Nervous System: Awake and alert.  Left-sided weakness.  Extremities: LE ankle edema neg, lower extremities warm    Cortrak+  Medications reviewed:  Scheduled Meds:  amLODipine  5 mg Per NG tube Daily   atorvastatin  40 mg Oral q1800   budesonide (PULMICORT) nebulizer solution  0.25 mg  Nebulization BID   diclofenac Sodium  2 g Topical QID   [START ON 10/11/2022] enoxaparin (LOVENOX) injection  40 mg Subcutaneous Daily   feeding supplement (PROSource TF20)  60 mL Per Tube Daily   fluticasone  1 spray Each Nare Daily   free water  110 mL Per Tube Q4H   insulin aspart  0-6 Units Subcutaneous Q4H   iron polysaccharides  150 mg Oral Daily   metoprolol tartrate  50 mg Per NG tube BID   mouth rinse  15 mL Mouth Rinse 4 times per day   pantoprazole (PROTONIX) IV  40 mg Intravenous Q24H   sodium chloride flush  3 mL Intravenous Q12H   Continuous Infusions:  dextrose 5 % and 0.9 % NaCl with KCl 20 mEq/L 30 mL/hr at 10/08/22 0800   feeding supplement (OSMOLITE 1.5 CAL) 1,000 mL (10/08/22 2114)   Diet Order             Diet NPO time specified  Diet effective midnight           Diet NPO time specified  Diet effective now                  Intake/Output Summary (Last 24 hours) at 10/09/2022 1719 Last data filed at 10/09/2022 1301 Gross per 24 hour  Intake --  Output 1150 ml  Net -1150 ml    Net IO Since Admission: -2,922.71 mL [10/09/22 1719]  Wt Readings from Last 3 Encounters:  10/09/22 53.5 kg  08/18/20 54 kg  03/28/17 61.2 kg     Unresulted Labs (From admission, onward)     Start     Ordered   10/06/22 0500  Creatinine, serum  (enoxaparin (LOVENOX)    CrCl >/= 30 ml/min)  Weekly,   R     Comments: while on enoxaparin therapy    09/29/22 0237   10/04/22 0500  CBC  Daily,   R     Question:  Specimen collection method  Answer:  Lab=Lab collect   10/03/22 0843   10/04/22 0500  Basic metabolic panel  Daily,   R     Question:  Specimen collection method  Answer:  Lab=Lab collect   10/03/22 0843          Data Reviewed: I have personally reviewed following labs and imaging studies CBC: Recent Labs  Lab 10/05/22 0633 10/06/22 0623 10/07/22 0101 10/08/22 0055 10/09/22 0101  WBC 7.2 6.8 6.9 6.5 9.8  HGB 8.9* 9.0* 8.9* 9.9* 9.8*  HCT 27.7* 27.6* 28.2*  31.0* 30.9*  MCV 96.2 93.2 95.3 96.9 96.3  PLT 253 276 272 289 320    Basic Metabolic Panel: Recent Labs  Lab 10/04/22 0740 10/04/22 1723 10/05/22 0633 10/05/22 1641 10/06/22 0623 10/07/22 0101 10/08/22 0055 10/09/22 0101  NA 137  --  136  --  137 138 138 135  K 3.8  --  3.6  --  3.8 3.6 3.6 3.8  CL 103  --  100  --  102 103 103 103  CO2 23  --  25  --  27 28 27 26   GLUCOSE 132*  --  140*  --  156* 133* 110* 132*  BUN 8  --  8  --  6* 7* 10 11  CREATININE 0.49  --  0.43*  --  0.44 0.47 0.43* 0.49  CALCIUM 8.3*  --  8.3*  --  8.3* 8.4* 8.7* 8.5*  MG 1.9 1.8 1.8 1.8 1.8  --   --   --   PHOS 2.9 2.0* 1.9* 4.2 2.5  --   --   --     GFR: Estimated Creatinine Clearance: 49.4 mL/min (by C-G formula based on SCr of 0.49 mg/dL). Liver Function Tests: No results for input(s): "AST", "ALT", "ALKPHOS", "BILITOT", "PROT", "ALBUMIN" in the last 168 hours.  No results for input(s): "INR", "PROTIME" in the last 168 hours.  Recent Labs  Lab 10/08/22 2357 10/09/22 0405 10/09/22 0807 10/09/22 1054 10/09/22 1638  GLUCAP 130* 147* 150* 129* 112*    No results found for this or any previous visit (from the past 240 hour(s)).   Antimicrobials: Anti-infectives (From admission, onward)    None      Culture/Microbiology    Component Value Date/Time   SDES  08/15/2020 1610    BLOOD RIGHT ARM Performed at [facility], [address] [place], [place] [address]    SPECREQUEST  08/15/2020 1610    BOTTLES DRAWN AEROBIC AND ANAEROBIC Blood Culture results may not be optimal due to an inadequate volume of blood received in culture bottles Performed at [facility], [address], [place], [place] [address]    CULT  08/15/2020 1610    NO GROWTH 5 DAYS Performed at [facility], [address] [place], [place] [address]    REPTSTATUS 08/20/2020 FINAL 08/15/2020 1610  Radiology Studies: No results found.   LOS: 10 days   Barnetta Chapel,  MD [facility]  10/09/2022, 5:19 PM

## 2022-10-09 NOTE — Progress Notes (Signed)
Pt. Refused cpap. 

## 2022-10-09 NOTE — Consult Note (Cosign Needed Addendum)
Chief Complaint: Patient was seen in consultation today for dysphagia at the request of Dr. Dayna Barker  Referring Physician(s): Dr. Dayna Barker  Supervising Physician: Gilmer Mor  Patient Status: [facility] - In-pt  History of Present Illness:  Brenda Riggs is a 67 y.o. female from facility who presented to [facility] ED 09/28/2022 complaining of weakness and increase in speech difficult.  Upon arrival patient found to be in A-fib with RVR with rate of 151, K+ 2.8 and WBC 18,300.  Patient was evidenced on Cardizem infusion. She converted back to NSR and Cardizem was d/c 6/1. During the course of her admission, she has been receiving tube feeds vis [facility] due to continued severe dysphagia resulting in moderate malnutrition with mild muscle and fat loss. She has been referred to IR for gastrostomy tube placement. Imaging was reviewed and anatomy approved by Dr. Loreta Ave.   Pt denies fever, chills, N/V.  She endorses intermit shortness breath, chest pain and weakness.  Consent obtained from daughter via telephone.  Past Medical History:  Diagnosis Date   Allergy    Asthma    Blood transfusion without reported diagnosis    Chronic kidney disease    COPD (chronic obstructive pulmonary disease) (HCC)    Depression    Diabetes mellitus without complication (HCC)    Hyperlipidemia    Hypertension    Stroke [facility])     Past Surgical History:  Procedure Laterality Date   ABDOMINAL HYSTERECTOMY     APPENDECTOMY     CESAREAN SECTION     TUBAL LIGATION      Allergies: Penicillins and Thioridazine hcl  Medications: Prior to Admission medications   Medication Sig Start Date End Date Taking? Authorizing Provider  acetaminophen (TYLENOL) 325 MG tablet Take 650 mg by mouth every 6 (six) hours as needed for headache (minor discomfort/fever up to 100 degrees orally).     [provider]  albuterol (PROVENTIL HFA;VENTOLIN HFA) 108 (90 BASE) MCG/ACT inhaler Inhale 2 puffs into the lungs 4 (four)  times daily as needed for wheezing or shortness of breath.     [provider]  alum & mag hydroxide-simeth (MAALOX/MYLANTA) 200-200-20 MG/5ML suspension Take 30 mLs by mouth once as needed for indigestion or heartburn.    [provider]  amLODipine (NORVASC) 5 MG tablet Take 1 tablet (5 mg total) by mouth daily. 03/26/20   Layne Benton, NP  atorvastatin (LIPITOR) 40 MG tablet Take 1 tablet (40 mg total) by mouth daily at 6 PM. 03/25/20   Layne Benton, NP  beclomethasone (QVAR REDIHALER) 40 MCG/ACT inhaler Inhale 2 puffs into the lungs 2 (two) times daily.    [provider]  cholecalciferol (VITAMIN D3) 25 MCG (1000 UNIT) tablet Take 1,000 Units by mouth daily.    [provider]  divalproex (DEPAKOTE ER) 250 MG 24 hr tablet Take 250 mg by mouth at bedtime.    [provider]  fluticasone (FLONASE) 50 MCG/ACT nasal spray Place 1 spray into both nostrils daily.    [provider]  guaifenesin (ROBITUSSIN) 100 MG/5ML syrup Take 200 mg by mouth every 6 (six) hours as needed for cough.    [provider]  loperamide (IMODIUM A-D) 2 MG tablet Take 2 mg by mouth 4 (four) times daily as needed for diarrhea or loose stools.     [provider]  magnesium hydroxide (MILK OF MAGNESIA) 400 MG/5ML suspension Take 22.5 mLs by mouth every 8 (eight) hours as needed (constipation (if no  relief in 8 hours give with 6 oz prune juice)).     [provider]  metFORMIN (GLUCOPHAGE) 500 MG tablet Take 500 mg by mouth every 12 (twelve) hours.     [provider]  metoprolol tartrate (LOPRESSOR) 50 MG tablet Take 1 tablet (50 mg total) by mouth 2 (two) times daily. 03/25/20   Layne Benton, NP  Nutritional Supplements (NUTRITIONAL SUPPLEMENT PO) Take 1 each by mouth 3 (three) times daily with meals.    [provider]  Pollen Extracts (PROSTAT PO) Take 30 Units by mouth in the morning and at bedtime.    [provider]  Sodium Phosphates (FLEET ENEMA RE) Place 1 enema rectally daily as needed (constipation not relieved by MOM and prune juice).    [provider]  vitamin C (ASCORBIC ACID) 500 MG tablet Take 500 mg by mouth daily.    [provider]     Family History  Problem Relation Age of Onset   Heart disease Mother     Social History   Socioeconomic History   Marital status: Single    Spouse name: Not on file   Number of children: Not on file   Years of education: Not on file   Highest education level: Not on file  Occupational History   Not on file  Tobacco Use   Smoking status: Some Days    Packs/day: 0.10    Years: 41.00    Additional pack years: 0.00    Total pack years: 4.10    Types: Cigarettes   Smokeless tobacco: Never  Vaping Use   Vaping Use: Never used  Substance and Sexual Activity   Alcohol use: No    Alcohol/week: 0.0 standard drinks of alcohol   Drug use: No   Sexual activity: Not on file  Other Topics Concern   Not on file  Social History Narrative   Not on file   Social Determinants of Health   Financial Resource Strain: Not on file  Food Insecurity: Not on file  Transportation Needs: Not on file  Physical Activity: Not on file  Stress: Not on file  Social Connections: Not on file     Review of Systems: A 12 point ROS discussed and pertinent positives are indicated in the HPI above.  All other systems are negative.  Review of Systems  Constitutional:  Negative for chills and fever.  Respiratory:  Positive for shortness of breath.   Cardiovascular:  Positive for chest pain.  Gastrointestinal:  Negative for nausea and vomiting.  Neurological:  Positive for weakness.    Vital Signs: BP (!) 132/55 (BP Location: Right Arm)   Pulse 74   Temp 98.1 F (36.7 C) (Oral)   Resp 20   Ht 4\' 9"  (1.448 m)   Wt 118 lb (53.5 kg)   SpO2 100%   BMI 25.53 kg/m   Advance Care Plan: The advanced care plan/surrogate decision  maker was discussed at the time of visit and documented in the medical record.   Pt is DNR but daughter wants pt to be FULL CODE during procedure.   Physical Exam Vitals reviewed.  Constitutional:      General: She is not in acute distress.    Appearance: She is ill-appearing.  HENT:     Nose:     Comments: Core track in place    Mouth/Throat:     Mouth: Mucous membranes are dry.     Pharynx: Oropharynx is clear.  Cardiovascular:  Rate and Rhythm: Normal rate and regular rhythm.  Pulmonary:     Effort: Pulmonary effort is normal. No respiratory distress.     Breath sounds: Rhonchi present.  Abdominal:     General: Bowel sounds are normal. There is no distension.     Palpations: Abdomen is soft.     Tenderness: There is no abdominal tenderness. There is no guarding.  Musculoskeletal:     Right lower leg: No edema.     Left lower leg: No edema.  Skin:    General: Skin is warm and dry.  Neurological:     Mental Status: She is alert. She is disoriented.     Imaging: CT ABDOMEN WO CONTRAST  Result Date: 10/07/2022 CLINICAL DATA:  Evaluate anatomy prior to potential percutaneous gastrostomy tube placement. EXAM: CT ABDOMEN WITHOUT CONTRAST TECHNIQUE: Multidetector CT imaging of the abdomen was performed following the standard protocol without IV contrast. RADIATION DOSE REDUCTION: This exam was performed according to the departmental dose-optimization program which includes automated exposure control, adjustment of the mA and/or kV according to patient size and/or use of iterative reconstruction technique. COMPARISON:  None Available. FINDINGS: The lack of intravenous contrast limits the ability to evaluate solid abdominal organs Lower chest: Limited evaluation of the lower thorax demonstrates minimal bibasilar dependent subpleural ground-glass atelectasis, right-greater-than-left. Normal heart size. Coronary artery calcifications. No pericardial effusion. Hepatobiliary: Normal  hepatic contour. Normal noncontrast appearance of the gallbladder given degree distention. No radiopaque gallstones. No ascites. Pancreas: Normal noncontrast appearance of the pancreas. Spleen: Normal noncontrast appearance of the spleen. Adrenals/Urinary Tract: There is a punctate (3 mm) nonobstructing stone within the superior pole of the right kidney (image 29, series 3). No evidence of left-sided nephrolithiasis. No urinary obstruction or perinephric stranding. Normal noncontrast appearance of the bilateral adrenal glands. The urinary bladder was not imaged. Stomach/Bowel: The anterior wall of the stomach is well apposed against the ventral wall of the upper abdomen without interposition of the hepatic parenchyma or transverse colon. Weighted tip enteric tube terminates within the gastric antrum/duodenal bulb. Radiopaque material is seen within the descending and sigmoid colon. No pneumoperitoneum, pneumatosis or portal venous gas. Vascular/Lymphatic: Atherosclerotic plaque within a normal caliber abdominal aorta. No bulky retroperitoneal or mesenteric lymphadenopathy on this noncontrast examination. Other: Subcutaneous emphysema involving the right lower abdominal pannus likely at the location of subcutaneous medication administration. Dermal calcifications are noted about the buttocks bilaterally. Musculoskeletal: No acute or aggressive osseous abnormalities. Grade 1 anterolisthesis of L4 upon L5 without associated pars defects. Mild-to-moderate multilevel lumbar spine DDD, worse at L2-L3 and L3-L4 with disc space height loss, endplate irregularity and sclerosis. Stigmata of dish within the lower thoracic spine. IMPRESSION: 1. Gastric anatomy amenable to attempted percutaneous gastrostomy tube placement as indicated. 2. Punctate (3 mm) nonobstructing right-sided renal stone. 3. Coronary artery calcifications. Aortic Atherosclerosis (ICD10-I70.0). Electronically Signed   By: Simonne Come M.D.   On: 10/07/2022  15:11   DG Abd Portable 1V  Result Date: 10/03/2022 CLINICAL DATA:  Encounter for feeding tube placement. EXAM: PORTABLE ABDOMEN - 1 VIEW COMPARISON:  10/02/2022 FINDINGS: Feeding tube in the upper abdomen with the tip just right of midline. The tip is likely in the distal stomach near the duodenal bulb. There is oral contrast in the colon and a small amount of contrast within the stomach. Gaseous distention of bowel loops throughout the abdomen. IMPRESSION: 1. Feeding tube tip is likely in the distal stomach near the duodenal bulb. 2. Gaseous distention of bowel loops throughout  the abdomen. Electronically Signed   By: Richarda Overlie M.D.   On: 10/03/2022 12:38   DG Swallowing Func-Speech Pathology  Result Date: 10/03/2022 Table formatting from the original result was not included. Modified Barium Swallow Study Patient Details Name: DANICE STUVE MRN: [id number] Date of Birth: 11/20/1955 Today's Date: 10/03/2022 HPI/PMH: HPI: 67 year old with prior history of ICH and poor baseline with dysarthria and weakness worse on the left than right at baseline presenting for evaluation of worsening slurred speech and worsening weakness.  MRI which does not show any evidence of an acute stroke.  Per MD, Has a leukocytosis-wonder if this is recrudescence of old symptoms in the setting of an underlying infection or recrudescence of stroke symptoms in the setting of acute illness-new onset A-fib. Most recent MBS complete 08/20/20 indicated a  severe oral and mild pharyngeal dysphagia mostly c/b decreased oral control from significant weakness, with recommendations for dysphagia 1 with thin liquid.  MBs also in 2021 with recommendations for dys 3, thin liquid. Clinical Impression: Clinical Impression: Pt demonstrates a severe oral dysphagia with very limited lingual function for bolus propulsion. When given teaspoons or straw sips pt has anterior spillage, lingual thrusting for propulsion with loss of bolus to the floor of mouth. Pt  often spews liquid from mouth while attempting to manipulate. Pt uses a posterior head tilt to transit boluses. Thin, honey and puree all spill to pyriforms and airway prior to swallow initaition while pt still attempting to propel remainder of bolus. Aspiration is silent. Though pt does have involuntary movement of base of tongue and soft palate, at rest both appear flaccid and pt has snoring respirations. THere is a constant pulsing movement of all oropharyngeal musculature. Pharyngeal strength and ROM of motion quite good once swallow triggered. Unsure of etiology of impairment. There is a dramatic difference since last MBS in 2022. Unsure of potential for improvement, would expect limited ability to participate in therapy. Factors that may increase risk of adverse event in presence of aspiration [name] & [name] 2021): Factors that may increase risk of adverse event in presence of aspiration [name] & [name] 2021): Weak cough; Aspiration of thick, dense, and/or acidic materials; Frequent aspiration of large volumes; Limited mobility; Reduced cognitive function; Poor general health and/or compromised immunity; Frail or deconditioned Recommendations/Plan: Swallowing Evaluation Recommendations Swallowing Evaluation Recommendations Recommendations: NPO; Alternative means of nutrition - NG Tube Medication Administration: Via alternative means Oral care recommendations: Oral care QID (4x/day) Recommended consults: Consider Palliative care Treatment Plan Treatment Plan Treatment recommendations: Therapy as outlined in treatment plan below Follow-up recommendations: Skilled nursing-short term rehab (<3 hours/day) Functional status assessment: Patient has had a recent decline in their functional status and/or demonstrates limited ability to make significant improvements in function in a reasonable and predictable amount of time. Treatment frequency: Min 2x/week Treatment duration: 2 weeks Interventions: Patient/family  education; Oropharyngeal exercises; Aspiration precaution training; Trials of upgraded texture/liquids Recommendations Recommendations for follow up therapy are one component of a multi-disciplinary discharge planning process, led by the attending physician.  Recommendations may be updated based on patient status, additional functional criteria and insurance authorization. Assessment: Orofacial Exam: Orofacial Exam Oral Cavity - Dentition: Adequate natural dentition Anatomy: No data recorded Boluses Administered: Boluses Administered Boluses Administered: Thin liquids (Level 0); Moderately thick liquids (Level 3, honey thick); Puree  Oral Impairment Domain: Oral Impairment Domain Lip Closure: Escape beyond mid-chin Tongue control during bolus hold: Posterior escape of greater than half of bolus Bolus preparation/mastication: -- (NT) Bolus transport/lingual motion:  Minimal-no tongue motion Oral residue: Majority of bolus remaining Location of oral residue : Floor of mouth; Lateral sulci Initiation of pharyngeal swallow : Pyriform sinuses (airway)  Pharyngeal Impairment Domain: Pharyngeal Impairment Domain Soft palate elevation: No bolus between soft palate (SP)/pharyngeal wall (PW) Laryngeal elevation: Complete superior movement of thyroid cartilage with complete approximation of arytenoids to epiglottic petiole Anterior hyoid excursion: Partial anterior movement Epiglottic movement: Partial inversion Laryngeal vestibule closure: Complete, no air/contrast in laryngeal vestibule Pharyngeal stripping wave : Present - complete Pharyngeal contraction (A/P view only): N/A Pharyngoesophageal segment opening: Complete distension and complete duration, no obstruction of flow Tongue base retraction: No contrast between tongue base and posterior pharyngeal wall (PPW) Pharyngeal residue: Trace residue within or on pharyngeal structures  Esophageal Impairment Domain: No data recorded Pill: No data recorded Penetration/Aspiration  Scale Score: Penetration/Aspiration Scale Score 8.  Material enters airway, passes BELOW cords without attempt by patient to eject out (silent aspiration) : Thin liquids (Level 0); Moderately thick liquids (Level 3, honey thick); Puree Compensatory Strategies: Compensatory Strategies Compensatory strategies: No   General Information: Caregiver present: No  Diet Prior to this Study: NPO   Temperature : Normal   No data recorded  Supplemental O2: None (Room air)   History of Recent Intubation: No  Behavior/Cognition: Alert; Cooperative Self-Feeding Abilities: Dependent for feeding Baseline vocal quality/speech: Abnormal resonance; Dysphonic Volitional Cough: Able to elicit Volitional Swallow: Able to elicit No data recorded Goal Planning: Prognosis for improved oropharyngeal function: Guarded Barriers to Reach Goals: Cognitive deficits; Time post onset; Severity of deficits; Behavior; Overall medical prognosis No data recorded No data recorded Consulted and agree with results and recommendations: Pt unable/family or caregiver not available; Physician; Nurse Pain: Pain Assessment Pain Assessment: No/denies pain Faces Pain Scale: 0 Breathing: 0 Negative Vocalization: 0 Facial Expression: 0 Body Language: 0 Consolability: 0 PAINAD Score: 0 End of Session: Start Time:SLP Start Time (ACUTE ONLY): 0940 Stop Time: SLP Stop Time (ACUTE ONLY): 1000 Time Calculation:SLP Time Calculation (min) (ACUTE ONLY): 20 min Charges: SLP Evaluations $ SLP Speech Visit: 1 Visit SLP Evaluations $BSS Swallow: 1 Procedure $MBS Swallow: 1 Procedure $Swallowing Treatment: 1 Procedure SLP visit diagnosis: SLP Visit Diagnosis: Dysphagia, oral phase (R13.11); Dysphagia, oropharyngeal phase (R13.12) Past Medical History: Past Medical History: Diagnosis Date  Allergy   Asthma   Blood transfusion without reported diagnosis   Chronic kidney disease   COPD (chronic obstructive pulmonary disease) (HCC)   Depression   Diabetes mellitus without  complication (HCC)   Hyperlipidemia   Hypertension   Stroke [facility])  Past Surgical History: Past Surgical History: Procedure Laterality Date  ABDOMINAL HYSTERECTOMY    APPENDECTOMY    CESAREAN SECTION    TUBAL LIGATION   DeBlois, Riley Nearing 10/03/2022, 10:48 AM  DG CHEST PORT 1 VIEW  Result Date: 10/03/2022 CLINICAL DATA:  [id number] Encounter for feeding tube placement [id number] EXAM: PORTABLE CHEST - 1 VIEW COMPARISON:  09/29/2022 FINDINGS: Nasogastric tube extends at least as far as the stomach, tip not seen. Lungs are clear. Heart size normal Aortic Atherosclerosis (ICD10-170.0). No effusion. Left shoulder DJD IMPRESSION: 1. No acute cardiopulmonary disease. 2. Nasogastric tube at least as far as the stomach. Electronically Signed   By: Corlis Leak M.D.   On: 10/03/2022 07:53   DG Abd Portable 1V  Result Date: 10/02/2022 CLINICAL DATA:  Evaluate NG tube EXAM: PORTABLE ABDOMEN - 1 VIEW COMPARISON:  October 02, 2022 FINDINGS: The NG tube terminates in the right mid abdomen. The distal tip is  flipped back on itself. No other abnormalities. IMPRESSION: The NG tube terminates in the right mid abdomen. The distal tip is flipped back on itself. Recommend repositioning. Electronically Signed   By: Gerome Sam III M.D.   On: 10/02/2022 13:26   DG Abd Portable 1V  Result Date: 10/02/2022 CLINICAL DATA:  Evaluate NG tube placement EXAM: PORTABLE ABDOMEN - 1 VIEW COMPARISON:  None Available. FINDINGS: The NG tube terminates in the stomach. IMPRESSION: The NG tube terminates in the stomach. Electronically Signed   By: Gerome Sam III M.D.   On: 10/02/2022 11:28   DG Abd Portable 1V-Small Bowel Obstruction Protocol-initial, 8 hr delay  Result Date: 10/02/2022 CLINICAL DATA:  Small bowel obstruction, 8 hour delay. EXAM: PORTABLE ABDOMEN - 1 VIEW COMPARISON:  Radiograph yesterday. FINDINGS: Administered enteric contrast is seen within the ascending, transverse, and descending colon to the level of the rectum. Mild  persistent gaseous small bowel distension centrally. Enteric tube is looped in the stomach with tip directed towards the gastric cardia. IMPRESSION: Administered enteric contrast within the colon. Mild persistent gaseous distention of small bowel centrally. Electronically Signed   By: Narda Rutherford M.D.   On: 10/02/2022 04:00   DG Abd 1 View  Result Date: 10/01/2022 CLINICAL DATA:  Nasogastric tube placement. EXAM: ABDOMEN - 1 VIEW COMPARISON:  09/30/2022. FINDINGS: Nasogastric tube passes well below the diaphragm, curling in the distal stomach, tip projecting in the fundus. Dilated small bowel is less prominent than on the previous day's study. IMPRESSION: 1. Well-positioned nasal/orogastric tube. Electronically Signed   By: Amie Portland M.D.   On: 10/01/2022 13:01   DG Abd 1 View  Result Date: 09/30/2022 CLINICAL DATA:  [id number] Vomiting [id number] EXAM: ABDOMEN - 1 VIEW COMPARISON:  Radiograph 08/15/2020 FINDINGS: Feeding tube tip overlies the distal stomach/proximal duodenum. There are multiple dilated loops of small bowel in the abdomen. The colon appears decompressed. IMPRESSION: Multiple dilated loops of small bowel with decompressed colon suggestive of small-bowel obstruction. Ileus is possible. Electronically Signed   By: Caprice Renshaw M.D.   On: 09/30/2022 13:11   DG Abd Portable 1V  Result Date: 09/30/2022 CLINICAL DATA:  Provided history: Encounter for feeding tube placement. EXAM: PORTABLE ABDOMEN - 1 VIEW COMPARISON:  Chest radiograph 09/29/2022. Abdominal radiograph 08/15/2020. FINDINGS: An enteric tube terminates in the expected location of the distal stomach/proximal duodenum. Nonspecific gaseous distension of bowel within the central abdomen at the imaged levels. No appreciable airspace consolidation within the imaged lungs. The cardiomediastinal silhouette is unchanged. Aortic atherosclerosis. IMPRESSION: 1. The enteric tube terminates in the expected location of the distal stomach/proximal  duodenum. 2. Nonspecific gaseous distension of bowel within the central abdomen at the imaged levels. Consider abdominal radiographs (to include the entirety of the abdomen) for further evaluation, as clinically warranted. 3.  Aortic Atherosclerosis (ICD10-I70.0). Electronically Signed   By: Jackey Loge D.O.   On: 09/30/2022 09:58   ECHOCARDIOGRAM COMPLETE  Result Date: 09/29/2022    ECHOCARDIOGRAM REPORT   Patient Name:   KAILIE KUIPERS Date of Exam: 09/29/2022 Medical Rec #:  [id number]      Height:       57.0 in Accession #:    [id number]     Weight:       110.4 lb Date of Birth:  November 12, 1955       BSA:          1.398 m Patient Age:    66 years       BP:  137/91 mmHg Patient Gender: F              HR:           80 bpm. Exam Location:  Inpatient Procedure: 2D Echo, Cardiac Doppler, Color Doppler and Intracardiac            Opacification Agent Indications:    Atrial Fibrillation I48.91  History:        Patient has prior history of Echocardiogram examinations, most                 recent 03/22/2020. Stroke, Arrythmias:Atrial Fibrillation; Risk                 Factors:Hypertension, Sleep Apnea, Diabetes, Dyslipidemia and                 Current Smoker.  Sonographer:    Lucendia Herrlich Referring Phys: [id number] TIMOTHY S OPYD IMPRESSIONS  1. Left ventricular ejection fraction, by estimation, is 65 to 70%. The left ventricle has normal function. The left ventricle has no regional wall motion abnormalities. There is moderate concentric left ventricular hypertrophy. Left ventricular diastolic parameters are indeterminate.  2. Right ventricular systolic function is normal. The right ventricular size is normal.  3. The mitral valve is normal in structure. Trivial mitral valve regurgitation.  4. The aortic valve is tricuspid. Aortic valve regurgitation is not visualized. Aortic valve sclerosis is present, with no evidence of aortic valve stenosis.  5. The inferior vena cava is normal in size with greater than 50%  respiratory variability, suggesting right atrial pressure of 3 mmHg. Comparison(s): No significant change from prior study. FINDINGS  Left Ventricle: Left ventricular ejection fraction, by estimation, is 65 to 70%. The left ventricle has normal function. The left ventricle has no regional wall motion abnormalities. Definity contrast agent was given IV to delineate the left ventricular  endocardial borders. The left ventricular internal cavity size was normal in size. There is moderate concentric left ventricular hypertrophy. Left ventricular diastolic parameters are indeterminate. Right Ventricle: The right ventricular size is normal. Right vetricular wall thickness was not well visualized. Right ventricular systolic function is normal. Left Atrium: Left atrial size was normal in size. Right Atrium: Right atrial size was normal in size. Pericardium: There is no evidence of pericardial effusion. Mitral Valve: The mitral valve is normal in structure. Trivial mitral valve regurgitation. Tricuspid Valve: The tricuspid valve is normal in structure. Tricuspid valve regurgitation is trivial. Aortic Valve: The aortic valve is tricuspid. Aortic valve regurgitation is not visualized. Aortic valve sclerosis is present, with no evidence of aortic valve stenosis. Aortic valve peak gradient measures 7.8 mmHg. Pulmonic Valve: The pulmonic valve was normal in structure. Pulmonic valve regurgitation is trivial. Aorta: The aortic root is normal in size and structure. Venous: The inferior vena cava is normal in size with greater than 50% respiratory variability, suggesting right atrial pressure of 3 mmHg. IAS/Shunts: The atrial septum is grossly normal.  LEFT VENTRICLE PLAX 2D LVIDd:         3.40 cm   Diastology LVIDs:         2.25 cm   LV e' medial:    7.15 cm/s LV PW:         1.15 cm   LV E/e' medial:  7.8 LV IVS:        1.20 cm   LV e' lateral:   10.20 cm/s LVOT diam:     1.90 cm   LV E/e' lateral: 5.5 LV  SV:         43 LV SV  Index:   31 LVOT Area:     2.84 cm  RIGHT VENTRICLE             IVC RV S prime:     13.20 cm/s  IVC diam: 1.20 cm LEFT ATRIUM           Index        RIGHT ATRIUM          Index LA diam:      2.95 cm 2.11 cm/m   RA Area:     9.09 cm LA Vol (A2C): 22.6 ml 16.17 ml/m  RA Volume:   13.30 ml 9.52 ml/m LA Vol (A4C): 24.9 ml 17.82 ml/m  AORTIC VALVE AV Area (Vmax): 1.95 cm AV Vmax:        140.00 cm/s AV Peak Grad:   7.8 mmHg LVOT Vmax:      96.50 cm/s LVOT Vmean:     61.200 cm/s LVOT VTI:       0.152 m  AORTA Ao Root diam: 2.80 cm Ao Asc diam:  3.00 cm MITRAL VALVE               TRICUSPID VALVE MV Area (PHT): 3.65 cm    TR Peak grad:   13.2 mmHg MV Decel Time: 208 msec    TR Vmax:        182.00 cm/s MV E velocity: 55.70 cm/s MV A velocity: 75.90 cm/s  SHUNTS MV E/A ratio:  0.73        Systemic VTI:  0.15 m                            Systemic Diam: 1.90 cm Laurance Flatten MD Electronically signed by Laurance Flatten MD Signature Date/Time: 09/29/2022/11:50:01 AM    Final    MR BRAIN WO CONTRAST  Result Date: 09/29/2022 CLINICAL DATA:  Stroke suspected, slurred speech, left-sided weakness EXAM: MRI HEAD WITHOUT CONTRAST TECHNIQUE: Multiplanar, multiecho pulse sequences of the brain and surrounding structures were obtained without intravenous contrast. COMPARISON:  08/15/2020 MRI head, correlation is also made with 09/28/2022 CT head FINDINGS: Evaluation is somewhat limited by the absence of a dedicated susceptibility weighted sequence, which could not be obtained due to patient noncooperation. Brain: No restricted diffusion to suggest acute or subacute infarct. No definite hemorrhage is seen to correlate with the suspected hyperdense focus on the 09/28/2022 CT, which likely reflects calcifications related to prior hemorrhage. No mass, mass effect, or midline shift. No hydrocephalus or extra-axial collection. Partial empty sella. Normal craniocervical junction. Numerous remote lacunar infarcts in the pons  bilateral thalami, right greater than left basal ganglia, and bilateral corona radiata. Although a SWI or GRE sequence was not obtained, foci of susceptibility compatible with hemosiderin deposition are noted on the T2 weighted sequence in the pons, midbrain, and medulla. Confluent T2 hyperintense signal in the periventricular white matter, likely the sequela of moderate chronic small vessel ischemic disease. Advanced cerebral volume loss for age. Vascular: Normal arterial flow voids. Skull and upper cervical spine: Normal marrow signal. Sinuses/Orbits: Clear paranasal sinuses. No acute finding in the orbits. Other: The mastoids are well aerated. IMPRESSION: Evaluation is somewhat limited by the absence of a dedicated susceptibility weighted sequence, which could not be obtained due to patient noncooperation. Within this limitation, no acute intracranial process. No evidence acute infarct or hemorrhage. Electronically Signed   By: Jill Side  Vasan M.D.   On: 09/29/2022 01:31   DG Chest Port 1 View  Result Date: 09/29/2022 CLINICAL DATA:  Encounter for screening. EXAM: PORTABLE CHEST 1 VIEW COMPARISON:  August 15, 2020 FINDINGS: The heart size and mediastinal contours are within normal limits. There is marked severity calcification of the aortic arch. Low lung volumes are noted. There is no evidence of an acute infiltrate, pleural effusion or pneumothorax. Radiopaque contrast is seen within the visualized portions of the renal collecting systems. Multilevel degenerative changes seen throughout the thoracic spine. IMPRESSION: No active disease. Electronically Signed   By: Aram Candela M.D.   On: 09/29/2022 01:30   CT ANGIO HEAD NECK W WO CM (CODE STROKE)  Result Date: 09/29/2022 CLINICAL DATA:  Slurred speech, worsening left-sided weakness EXAM: CT ANGIOGRAPHY HEAD AND NECK WITH AND WITHOUT CONTRAST TECHNIQUE: Multidetector CT imaging of the head and neck was performed using the standard protocol during bolus  administration of intravenous contrast. Multiplanar CT image reconstructions and MIPs were obtained to evaluate the vascular anatomy. Carotid stenosis measurements (when applicable) are obtained utilizing NASCET criteria, using the distal internal carotid diameter as the denominator. RADIATION DOSE REDUCTION: This exam was performed according to the departmental dose-optimization program which includes automated exposure control, adjustment of the mA and/or kV according to patient size and/or use of iterative reconstruction technique. CONTRAST:  75 mL Omnipaque 350 COMPARISON:  No prior CTA available, correlation is made with MRA 03/21/2020 FINDINGS: CT HEAD FINDINGS For noncontrast findings, please see same day CT head. CTA NECK FINDINGS Aortic arch: Standard branching. Imaged portion shows no evidence of aneurysm or dissection. No significant stenosis of the major arch vessel origins. Aortic atherosclerosis. Right carotid system: No evidence of dissection, occlusion, or hemodynamically significant stenosis (greater than 50%). Left carotid system: No evidence of dissection, occlusion, or hemodynamically significant stenosis (greater than 50%). Vertebral arteries: No evidence of dissection, occlusion, or hemodynamically significant stenosis (greater than 50%). Skeleton: No acute osseous abnormality. Degenerative changes in the cervical spine. Other neck: Negative. Upper chest: No focal pulmonary opacity or pleural effusion. Emphysema. Review of the MIP images confirms the above findings CTA HEAD FINDINGS Anterior circulation: Both internal carotid arteries are patent to the termini, without significant stenosis. A1 segments patent. Normal anterior communicating artery. Anterior cerebral arteries are patent to their distal aspects without significant stenosis. No M1 stenosis or occlusion. MCA branches perfused to their distal aspects without significant stenosis. Posterior circulation: Vertebral arteries patent to  the vertebrobasilar junction without significant stenosis. The right PICA is patent proximally. Common origin of the left AICA and PICA. Basilar patent to its distal aspect without significant stenosis. Superior cerebellar arteries patent proximally. Patent P1 segments. PCAs perfused to their distal aspects with mild multifocal narrowing throughout. The bilateral posterior communicating arteries are not visualized. Venous sinuses: Not well opacified due to arterial timing. Anatomic variants: None significant. Review of the MIP images confirms the above findings IMPRESSION: 1. No intracranial large vessel occlusion or significant stenosis. 2. No hemodynamically significant stenosis in the neck. 3. Aortic atherosclerosis. Aortic Atherosclerosis (ICD10-I70.0). Electronically Signed   By: Wiliam Ke M.D.   On: 09/29/2022 00:03   CT HEAD CODE STROKE WO CONTRAST  Result Date: 09/28/2022 CLINICAL DATA:  Code stroke. Slurred speech, worsening left-sided weakness, prior strokes EXAM: CT HEAD WITHOUT CONTRAST TECHNIQUE: Contiguous axial images were obtained from the base of the skull through the vertex without intravenous contrast. RADIATION DOSE REDUCTION: This exam was performed according to the departmental dose-optimization program  which includes automated exposure control, adjustment of the mA and/or kV according to patient size and/or use of iterative reconstruction technique. COMPARISON:  08/16/2020 FINDINGS: Brain: Possible small focus of hyperdensity at the left aspect of the pons (series 2, image 11 and series 6, image 28), which could represent hemorrhage, calcification, or laminar necrosis. No evidence of acute infarction, mass, mass effect, or midline shift. No hydrocephalus or extra-axial collection. Vascular: No hyperdense vessel. Skull: Negative for fracture or focal lesion. Sinuses/Orbits: No acute finding. Other: The mastoid air cells are well aerated. ASPECTS (Alberta Stroke Program Early CT Score) -  Ganglionic level infarction (caudate, lentiform nuclei, internal capsule, insula, M1-M3 cortex): 7 - Supraganglionic infarction (M4-M6 cortex): 3 Total score (0-10 with 10 being normal): 10 IMPRESSION: Possible small focus of hyperdensity at the left aspect of the pons, which could represent hemorrhage, calcification, or laminar necrosis. Attention on subsequent CTA and MRI. These findings were discussed on 09/28/2022 at 11:54 pm with provider ARORA. Electronically Signed   By: Wiliam Ke M.D.   On: 09/28/2022 23:54    Labs:  CBC: Recent Labs    10/06/22 0623 10/07/22 0101 10/08/22 0055 10/09/22 0101  WBC 6.8 6.9 6.5 9.8  HGB 9.0* 8.9* 9.9* 9.8*  HCT 27.6* 28.2* 31.0* 30.9*  PLT 276 272 289 320    COAGS: Recent Labs    09/28/22 2337  INR 1.0  APTT 26    BMP: Recent Labs    10/06/22 0623 10/07/22 0101 10/08/22 0055 10/09/22 0101  NA 137 138 138 135  K 3.8 3.6 3.6 3.8  CL 102 103 103 103  CO2 27 28 27 26   GLUCOSE 156* 133* 110* 132*  BUN 6* 7* 10 11  CALCIUM 8.3* 8.4* 8.7* 8.5*  CREATININE 0.44 0.47 0.43* 0.49  GFRNONAA >60 >60 >60 >60    LIVER FUNCTION TESTS: Recent Labs    09/28/22 2337 09/30/22 0037  BILITOT 0.6 0.7  AST 28 25  ALT 23 19  ALKPHOS 71 65  PROT 7.7 7.0  ALBUMIN 3.8 3.3*    TUMOR MARKERS: No results for input(s): "AFPTM", "CEA", "CA199", "CHROMGRNA" in the last 8760 hours.  Assessment and Plan:  67 yo female with PMHX significant for asthma, DM II, HTN, HLD, ICH with residual dysarthria, PAF, AKI and OSA presents to IR for gastrostomy tube placement.   Patient resting in bed. She is alert.  She is unable to speak but shakes her head to yes and no questions. She is in no distress.  Gastrostomy tube placement tentatively planned for 10/10/2022. Orders placed to stop to eat at midnight 6/10.  Risks and benefits image guided gastrostomy tube placement was discussed with the patient's daughter including, but not limited to the need for a  barium enema during the procedure, bleeding, infection, peritonitis and/or damage to adjacent structures.  All of the patient's daughters questions were answered, patient is agreeable to proceed.  Consent signed in APP office.  Thank you for this interesting consult.  I greatly enjoyed meeting Brenda Riggs and look forward to participating in their care.  A copy of this report was sent to the requesting provider on this date.  Electronically Signed: Shon Hough, NP 10/09/2022, 8:43 AM   I spent a total of 20 minutes in face to face in clinical consultation, greater than 50% of which was counseling/coordinating care for severe dysphagia, FTT.

## 2022-10-10 ENCOUNTER — Inpatient Hospital Stay (HOSPITAL_COMMUNITY): Payer: Medicare Other

## 2022-10-10 DIAGNOSIS — I4891 Unspecified atrial fibrillation: Secondary | ICD-10-CM | POA: Diagnosis not present

## 2022-10-10 DIAGNOSIS — I480 Paroxysmal atrial fibrillation: Secondary | ICD-10-CM | POA: Diagnosis not present

## 2022-10-10 HISTORY — PX: IR GASTROSTOMY TUBE MOD SED: IMG625

## 2022-10-10 LAB — BASIC METABOLIC PANEL
Anion gap: 7 (ref 5–15)
BUN: 12 mg/dL (ref 8–23)
CO2: 26 mmol/L (ref 22–32)
Calcium: 8.5 mg/dL — ABNORMAL LOW (ref 8.9–10.3)
Chloride: 102 mmol/L (ref 98–111)
Creatinine, Ser: 0.52 mg/dL (ref 0.44–1.00)
GFR, Estimated: >60 mL/min (ref >60)
Glucose, Bld: 139 mg/dL — ABNORMAL HIGH (ref 70–99)
Potassium: 3.9 mmol/L (ref 3.5–5.1)
Sodium: 135 mmol/L (ref 135–145)

## 2022-10-10 LAB — GLUCOSE, CAPILLARY
Glucose-Capillary: 102 mg/dL — ABNORMAL HIGH (ref 70–99)
Glucose-Capillary: 102 mg/dL — ABNORMAL HIGH (ref 70–99)
Glucose-Capillary: 111 mg/dL — ABNORMAL HIGH (ref 70–99)
Glucose-Capillary: 156 mg/dL — ABNORMAL HIGH (ref 70–99)
Glucose-Capillary: 99 mg/dL (ref 70–99)
Glucose-Capillary: 99 mg/dL (ref 70–99)

## 2022-10-10 LAB — CBC
HCT: 31.6 % — ABNORMAL LOW (ref 36.0–46.0)
Hemoglobin: 10.1 g/dL — ABNORMAL LOW (ref 12.0–15.0)
MCH: 31.4 pg (ref 26.0–34.0)
MCHC: 32 g/dL (ref 30.0–36.0)
MCV: 98.1 fL (ref 80.0–100.0)
Platelets: 328 10*3/uL (ref 150–400)
RBC: 3.22 MIL/uL — ABNORMAL LOW (ref 3.87–5.11)
RDW: 14 % (ref 11.5–15.5)
WBC: 9.3 10*3/uL (ref 4.0–10.5)
nRBC: 0 % (ref 0.0–0.2)

## 2022-10-10 MED ORDER — FENTANYL CITRATE (PF) 100 MCG/2ML IJ SOLN
INTRAMUSCULAR | Status: AC
  Filled 2022-10-10: qty 2

## 2022-10-10 MED ORDER — BACITRACIN-NEOMYCIN-POLYMYXIN OINTMENT TUBE
TOPICAL_OINTMENT | Freq: Every day | CUTANEOUS | Status: DC
  Administered 2022-10-11: 1 via TOPICAL
  Filled 2022-10-10: qty 14

## 2022-10-10 MED ORDER — FENTANYL CITRATE (PF) 100 MCG/2ML IJ SOLN
INTRAMUSCULAR | Status: AC | PRN
  Administered 2022-10-10: 25 ug via INTRAVENOUS

## 2022-10-10 MED ORDER — GLUCAGON HCL RDNA (DIAGNOSTIC) 1 MG IJ SOLR
INTRAMUSCULAR | Status: AC
  Filled 2022-10-10: qty 1

## 2022-10-10 MED ORDER — MIDAZOLAM HCL 2 MG/2ML IJ SOLN
INTRAMUSCULAR | Status: AC
  Filled 2022-10-10: qty 2

## 2022-10-10 MED ORDER — VANCOMYCIN HCL IN DEXTROSE 1-5 GM/200ML-% IV SOLN
INTRAVENOUS | Status: AC | PRN
  Administered 2022-10-10: 1000 mg via INTRAVENOUS

## 2022-10-10 MED ORDER — TRIPLE ANTIBIOTIC 3.5-400-5000 EX OINT
1.0000 | TOPICAL_OINTMENT | Freq: Every day | CUTANEOUS | Status: DC
  Filled 2022-10-10: qty 1

## 2022-10-10 MED ORDER — LIDOCAINE HCL 1 % IJ SOLN
INTRAMUSCULAR | Status: AC
  Filled 2022-10-10: qty 20

## 2022-10-10 MED ORDER — VANCOMYCIN HCL IN DEXTROSE 1-5 GM/200ML-% IV SOLN
INTRAVENOUS | Status: AC
  Filled 2022-10-10: qty 200

## 2022-10-10 MED ORDER — MIDAZOLAM HCL 2 MG/2ML IJ SOLN
INTRAMUSCULAR | Status: AC | PRN
  Administered 2022-10-10: .5 mg via INTRAVENOUS

## 2022-10-10 MED ORDER — IOHEXOL 300 MG/ML  SOLN
50.0000 mL | Freq: Once | INTRAMUSCULAR | Status: AC | PRN
  Administered 2022-10-10: 15 mL

## 2022-10-10 NOTE — TOC Progression Note (Signed)
Transition of Care [facility]) - Progression Note    Patient Details  Name: Brenda Riggs MRN: [id number] Date of Birth: 06-Sep-1955  Transition of Care [facility]) CM/SW Contact  Leander Rams, LCSW Phone Number: 10/10/2022, 2:33 PM  Clinical Narrative:    MD notified CSW that pt will be ready for dc tomorrow. CSW confirmed with [facility] that pt can return tomorrow.  TOC will continue to follow.    Expected Discharge Plan: Skilled Nursing Facility Barriers to Discharge: Continued Medical Work up  Expected Discharge Plan and Services       Living arrangements for the past 2 months: Skilled Nursing Facility                                       Social Determinants of Health (SDOH) Interventions SDOH Screenings   Housing: Patient Unable To Answer (10/02/2022)  Tobacco Use: High Risk (09/29/2022)    Readmission Risk Interventions     No data to display         Oletta Lamas, MSW, LCSWA, LCASA Transitions of Care  Clinical Social Worker I

## 2022-10-10 NOTE — Procedures (Signed)
Interventional Radiology Procedure Note  Procedure: Placement of percutaneous 73F pull-through gastrostomy tube. Complications: None Recommendations: - NPO except for sips and chips x 4 hours - Routine wound care - May advance diet/usage in 4 hours  Signed,   Yvone Neu. Loreta Ave, DO

## 2022-10-10 NOTE — Progress Notes (Signed)
PROGRESS NOTE Brenda Riggs  ZOX:[id number] DOB: 27-Feb-1956 DOA: 09/28/2022 PCP: [name], MD  Brief Narrative/Hospital Course: As per prior documentation by [name], MD: "50 yof w/ multiple medical comorbidities including asthma, diabetes mellitus type 2, hypertension, hyperlipidemia, ICH with residual dysarthria presented for worsening weakness and worsening speech difficulty on 09/28/22  In the ED:A-fib with RVR with HR 151.MRI brain - limited study but negative for acute intracranial abnormality. Neurology was consulted, felt likely recrudescence of old stroke symptoms in setting of underlying systemic infection. Possible small stroke in setting of A-fib. No anticoagulation recommended due to extremely high risk of hemorrhage in the brain because of presence of chronic microhemorrhages and prior ICH as per neurology.  Patient was admitted Managed with Cardizem drip for new onset A-fib.Echo 5/30:EF 65 to 70%, no RWMA moderate concentric LVH 5/30>some vomiting and no bowel movement since admission, x-ray abdomen multiple dilated loops of small bowel with decompressed colon possible SBO or ileus> General surgery was consulted> SBO resolved surgery signed off SLP following> recommended n.p.o. and alternative means of nutrition intervention core track tube feeding.  Palliative care following for goals of care discussion/PEG tube feeding"  10/08/2022: Patient seen alongside patient's nurse.  No significant history from patient.  Patient's nurse tells me that he is planning to place a PEG tube.  10/09/2022: Interventional radiology input is appreciated.  Patient's family has opted for PEG tube placement.  Otherwise, no new changes.  10/10/2022: Patient seen.  No new changes.  For PEG tube placement today.  Likely discharge tomorrow.  Updated patient's daughter that patient may be discharged back tomorrow.  Subjective: No significant history from previously. No new changes.  Assessment and  Plan: Principal Problem:   Atrial fibrillation with RVR (HCC) Active Problems:   Type II diabetes mellitus (HCC)   Essential hypertension, benign   Asthma   Hypokalemia   History of stroke   Atrial fibrillation (HCC)   Malnutrition of moderate degree   Ileus versus SBO: -Resolved. -Patient is currently on tube feeds.  10/10/2022: For possible PEG tube placement tomorrow.  Severe oral dysphagia Moderate malnutrition: Recent MBS:08/20/20 a severe oral and mild pharyngeal dysphagia mostly c/b decreased oral control from significant weakness> dysphagia diet with thin liquids was recommended.MBS also in 2021> dysphagia 3 with thin liquid was recommended MBS 6/3> n.p.o. recommended and remains on core track tube feeding and ivf pending further GOC by PM. SLP and RD following closely.  10/08/2022: Continue tube feeds for now.  Palliative care input is appreciated. 10/09/2022: For PEG tube placement.  Guarded prognosis. 10/10/2022: For PEG tube placement today.  Likely discharge tomorrow.  Guarded prognosis.  New onset WJX:BJYNWGNFA to NSR.Cardizem drip discontinued 6/1. No anticoagulation recommended due to risk of intracranial hemorrhage, echo with normal EF TSH stable.  Cardiology consult if recurrence of A-fib 10/10/2022: Heart rate is controlled.  Worsening weakness and dysarthria  History of stroke: Seen by neurology MRI brain unremarkable felt to be a recrudescence of old stroke symptoms.  Continue her statin.  Hypokalemia/hypophosphatemia/hypomagnesemia: Resolved   Type 2 diabetes mellitus blood sugar is well controlled on SSI. Recent Labs  Lab 10/09/22 2020 10/10/22 0013 10/10/22 0506 10/10/22 0755 10/10/22 1037  GLUCAP 129* 156* 99 102* 102*     Leukocytosis resolved.  AKI: -Resolved.   -On IV fluid at 30 cc/h.  Patient is also on tube feeds.    Hypertension:  -Reasonably controlled. -Continue to optimize.     OSA continue nightly CPAP  OZH:YQMV low  stable  b12,fa-completed IV iron x 5 days.  Hemoglobin Recent Labs  Lab 10/06/22 0623 10/07/22 0101 10/08/22 0055 10/09/22 0101 10/10/22 0046  HGB 9.0* 8.9* 9.9* 9.8* 10.1*  HCT 27.6* 28.2* 31.0* 30.9* 31.6*     Failure to thrive/deconditioning complex comorbidities with dysphagia stroke and malnutrition moderate PMT consulted and planning on family meeting 10/07/22 for further goals of care. Consider DNR/DNI understanding evidenced-based poor outcomes in similar hospitalized ptient, as a cause of arrest is likely associated with advanced chronic illness rather than easily reversible acute cardiopulmonary. She has complex medical comorbidity and prognosis does not appear bright.  Moderate malnutrition augment diet as below Nutrition Problem: Moderate Malnutrition Etiology: chronic illness Signs/Symptoms: mild fat depletion, mild muscle depletion Interventions: Tube feeding, Refer to RD note for recommendations   DVT prophylaxis: enoxaparin (LOVENOX) injection 40 mg Start: 10/11/22 1000 Code Status:   Code Status: DNR Family Communication: plan of care discussed with patient at bedside. Patient status is:  inpatient  because of  dysphagia Level of care: Progressive   Dispo: The patient is from:snf            Anticipated disposition: tbd Objective: Vitals last 24 hrs: Vitals:   10/10/22 1600 10/10/22 1605 10/10/22 1610 10/10/22 1625  BP: (!) 184/78 (!) 217/88 (!) 207/91 (!) 148/73  Pulse: 74 72 74 69  Resp: 18 14 15 14   Temp:      TempSrc:      SpO2: 100% 100% 100% 98%  Weight:      Height:       Weight change: 0.907 kg  Physical Examination: General exam: Chronically ill looking.  Dysarthric.  Left-sided weakness.   HEENT: Patient is pale.  No jaundice.   Respiratory system: Decreased air entry globally.   Cardiovascular system: S1 & S2. Gastrointestinal system: Abdomen is soft and nontender.   Nervous System: Awake and alert.  Left-sided weakness.  Extremities: LE ankle edema  neg, lower extremities warm    Cortrak+  Medications reviewed:  Scheduled Meds:  amLODipine  5 mg Per NG tube Daily   atorvastatin  40 mg Per Tube q1800   budesonide (PULMICORT) nebulizer solution  0.25 mg Nebulization BID   diclofenac Sodium  2 g Topical QID   [START ON 10/11/2022] enoxaparin (LOVENOX) injection  40 mg Subcutaneous Daily   fluticasone  1 spray Each Nare Daily   insulin aspart  0-6 Units Subcutaneous Q4H   iron polysaccharides  150 mg Per Tube Daily   metoprolol tartrate  50 mg Per NG tube BID   mouth rinse  15 mL Mouth Rinse 4 times per day   pantoprazole (PROTONIX) IV  40 mg Intravenous Q24H   sodium chloride flush  3 mL Intravenous Q12H   Continuous Infusions:  dextrose 5 % and 0.9 % NaCl with KCl 20 mEq/L 30 mL/hr at 10/09/22 2012   Diet Order             Diet NPO time specified  Diet effective midnight                  Intake/Output Summary (Last 24 hours) at 10/10/2022 1647 Last data filed at 10/10/2022 1041 Gross per 24 hour  Intake 100 ml  Output 400 ml  Net -300 ml    Net IO Since Admission: -3,222.71 mL [10/10/22 1647]  Wt Readings from Last 3 Encounters:  10/10/22 54.4 kg  08/18/20 54 kg  03/28/17 61.2 kg     Unresulted Labs (From admission,  onward)     Start     Ordered   10/06/22 0500  Creatinine, serum  (enoxaparin (LOVENOX)    CrCl >/= 30 ml/min)  Weekly,   R     Comments: while on enoxaparin therapy    09/29/22 0237          Data Reviewed: I have personally reviewed following labs and imaging studies CBC: Recent Labs  Lab 10/06/22 0623 10/07/22 0101 10/08/22 0055 10/09/22 0101 10/10/22 0046  WBC 6.8 6.9 6.5 9.8 9.3  HGB 9.0* 8.9* 9.9* 9.8* 10.1*  HCT 27.6* 28.2* 31.0* 30.9* 31.6*  MCV 93.2 95.3 96.9 96.3 98.1  PLT 276 272 289 320 328    Basic Metabolic Panel: Recent Labs  Lab 10/04/22 0740 10/04/22 1723 10/05/22 1610 10/05/22 1641 10/06/22 0623 10/07/22 0101 10/08/22 0055 10/09/22 0101 10/10/22 0046   NA 137  --  136  --  137 138 138 135 135  K 3.8  --  3.6  --  3.8 3.6 3.6 3.8 3.9  CL 103  --  100  --  102 103 103 103 102  CO2 23  --  25  --  27 28 27 26 26   GLUCOSE 132*  --  140*  --  156* 133* 110* 132* 139*  BUN 8  --  8  --  6* 7* 10 11 12   CREATININE 0.49  --  0.43*  --  0.44 0.47 0.43* 0.49 0.52  CALCIUM 8.3*  --  8.3*  --  8.3* 8.4* 8.7* 8.5* 8.5*  MG 1.9 1.8 1.8 1.8 1.8  --   --   --   --   PHOS 2.9 2.0* 1.9* 4.2 2.5  --   --   --   --     GFR: Estimated Creatinine Clearance: 49.7 mL/min (by C-G formula based on SCr of 0.52 mg/dL). Liver Function Tests: No results for input(s): "AST", "ALT", "ALKPHOS", "BILITOT", "PROT", "ALBUMIN" in the last 168 hours.  No results for input(s): "INR", "PROTIME" in the last 168 hours.  Recent Labs  Lab 10/09/22 2020 10/10/22 0013 10/10/22 0506 10/10/22 0755 10/10/22 1037  GLUCAP 129* 156* 99 102* 102*    No results found for this or any previous visit (from the past 240 hour(s)).   Antimicrobials: Anti-infectives (From admission, onward)    Start     Dose/Rate Route Frequency Ordered Stop   10/10/22 1528  vancomycin (VANCOCIN) IVPB 1000 mg/200 mL premix        over 60 Minutes Intravenous Continuous PRN 10/10/22 1528 10/10/22 1528      Culture/Microbiology    Component Value Date/Time   SDES  08/15/2020 1610    BLOOD RIGHT ARM Performed at [facility], [address], [place], [place] [address]    SPECREQUEST  08/15/2020 1610    BOTTLES DRAWN AEROBIC AND ANAEROBIC Blood Culture results may not be optimal due to an inadequate volume of blood received in culture bottles Performed at [facility], [address] [place], [place] [address]    CULT  08/15/2020 1610    NO GROWTH 5 DAYS Performed at [facility], [address] [place], [place] [address]    REPTSTATUS 08/20/2020 FINAL 08/15/2020 1610  Radiology Studies: DG CHEST PORT 1 VIEW  Result Date: 10/10/2022 CLINICAL DATA:   Aspiration pneumonia EXAM: PORTABLE CHEST 1 VIEW COMPARISON:  Chest x-ray 10/03/2022 FINDINGS: Enteric tube extends below the diaphragm. The heart size and mediastinal contours are  within normal limits. Both lungs are clear. The visualized skeletal structures are unremarkable. IMPRESSION: No active disease. Electronically Signed   By: Darliss Cheney M.D.   On: 10/10/2022 16:38     LOS: 11 days   Barnetta Chapel, MD [facility]  10/10/2022, 4:47 PM

## 2022-10-10 NOTE — Plan of Care (Signed)
  Problem: Activity: Goal: Ability to tolerate increased activity will improve Outcome: Progressing   Problem: Fluid Volume: Goal: Ability to maintain a balanced intake and output will improve Outcome: Progressing   Problem: Nutritional: Goal: Maintenance of adequate nutrition will improve Outcome: Progressing   

## 2022-10-10 NOTE — Progress Notes (Signed)
Nutrition Follow-up  DOCUMENTATION CODES:   Non-severe (moderate) malnutrition in context of chronic illness  INTERVENTION:  Once PEG tube ready for use, recommend: Jevity 1.5 at 26ml/hr ( per day) Free water flushes 95ml q4h Provides 1440 kcal, 61g protein, free water  NUTRITION DIAGNOSIS:   Moderate Malnutrition related to chronic illness as evidenced by mild fat depletion, mild muscle depletion. - remains applicable  GOAL:   Patient will meet greater than or equal to 90% of their needs - goal met via TF  MONITOR:   Diet advancement, Labs, Weight trends, TF tolerance, Skin  REASON FOR ASSESSMENT:   Consult Assessment of nutrition requirement/status, Other (Comment) (Cortrak list)  ASSESSMENT:   Pt from SNF d/t increased L sided weakness and speech difficulty. PMH significant for asthma, T2DM, HTN, HLD, ICH with residual dysarthria.  5/30 - s/p BSE- recommend NPO d/t poor oral management of bolus and inability to initiate a palpable pharyngeal swallow; poor ability to manage secretions   5/31 - s/p abd xray, findings suggestive of ileus/motility dysfunction; NGT to LIS 6/3 - s/p MBS findings of severe oral dysphagia with very limited lingual function for bolus propulsion; continue NPO; Cortrak placed (tip in distal stomach)  Tentative plans for PEG tube placement today.   Spoke with pt's sister at bedside. No concerns or questions at this time.   Reviewed weight throughout admission. Pt's weight has remained fairly stable between 54-55 kg since receiving tube feeding.   Medications: SSI 0-6 units q4h, niferex, protonix  Labs reviewed  CBG's 99-129 x24 hours  Diet Order:   Diet Order             Diet NPO time specified  Diet effective midnight                   EDUCATION NEEDS:   Education needs have been addressed  Skin:  Skin Assessment: Reviewed RN Assessment  Last BM:  6/9 (type 7 x1)  Height:   Ht Readings from Last 1  Encounters:  09/29/22 4\' 9"  (1.448 m)    Weight:   Wt Readings from Last 1 Encounters:  10/10/22 54.4 kg   BMI:  Body mass index is 25.97 kg/m.  Estimated Nutritional Needs:   Kcal:  1300-1500  Protein:  65-80g  Fluid:  1.3-1.5L  Drusilla Kanner, RDN, LDN Clinical Nutrition

## 2022-10-11 DIAGNOSIS — I4891 Unspecified atrial fibrillation: Secondary | ICD-10-CM | POA: Diagnosis not present

## 2022-10-11 LAB — GLUCOSE, CAPILLARY
Glucose-Capillary: 103 mg/dL — ABNORMAL HIGH (ref 70–99)
Glucose-Capillary: 106 mg/dL — ABNORMAL HIGH (ref 70–99)
Glucose-Capillary: 162 mg/dL — ABNORMAL HIGH (ref 70–99)
Glucose-Capillary: 181 mg/dL — ABNORMAL HIGH (ref 70–99)

## 2022-10-11 MED ORDER — PANTOPRAZOLE SODIUM 40 MG IV SOLR: 40.0000 mg | INTRAVENOUS | Status: DC

## 2022-10-11 MED ORDER — JEVITY 1.5 CAL/FIBER PO LIQD: 237.0000 mL | Freq: Four times a day (QID) | ORAL | Status: DC

## 2022-10-11 MED ORDER — BACITRACIN-NEOMYCIN-POLYMYXIN OINTMENT TUBE: 1.0000 | TOPICAL_OINTMENT | Freq: Every day | CUTANEOUS | Status: DC

## 2022-10-11 MED ORDER — DICLOFENAC SODIUM 1 % EX GEL: 2.0000 g | Freq: Four times a day (QID) | CUTANEOUS | Status: DC

## 2022-10-11 MED ORDER — ISOSOURCE 1.5 CAL PO LIQD: 250.0000 mL | Freq: Four times a day (QID) | ORAL | 0 refills | Status: DC

## 2022-10-11 MED ORDER — JEVITY 1.5 CAL/FIBER PO LIQD
237.0000 mL | Freq: Four times a day (QID) | ORAL | Status: DC
  Administered 2022-10-11 (×2): 237 mL
  Filled 2022-10-11 (×4): qty 237

## 2022-10-11 MED ORDER — ORAL CARE MOUTH RINSE: 15.0000 mL | OROMUCOSAL | 0 refills | Status: DC | PRN

## 2022-10-11 MED ORDER — POLYSACCHARIDE IRON COMPLEX 150 MG PO CAPS: 150.0000 mg | ORAL_CAPSULE | Freq: Every day | ORAL | Status: DC

## 2022-10-11 MED ORDER — VALPROIC ACID 250 MG/5ML PO SOLN: 125.0000 mg | Freq: Two times a day (BID) | ORAL | 0 refills | Status: AC

## 2022-10-11 MED ORDER — FREE WATER
85.0000 mL | Freq: Four times a day (QID) | Status: DC
  Administered 2022-10-11: 85 mL

## 2022-10-11 NOTE — TOC Transition Note (Signed)
Transition of Care [facility]) - CM/SW Discharge Note   Patient Details  Name: Brenda Riggs MRN: [id number] Date of Birth: 1956/04/17  [facility]) CM/SW Contact:  Leander Rams, LCSW Phone Number: 10/11/2022, 1:08 PM   Clinical Narrative:    Patient will DC to: [facility] Anticipated DC date: 10/11/2022 Family notified: [name] Transport by: Sharin Mons   Per MD patient ready for DC to [facility]. RN, patient, patient's family, and facility notified of DC. Discharge Summary and FL2 sent to facility. RN to call report prior to discharge [phone]. DC packet on chart. Ambulance transport requested for patient.   CSW will sign off for now as social work intervention is no longer needed. Please consult [place] again if new needs arise.    Final next level of care: Skilled Nursing Facility Barriers to Discharge: No Barriers Identified   Patient Goals and CMS Choice      Discharge Placement                Patient chooses bed at: [facility] Patient to be transferred to facility by: [facility] Name of family member notified: [name] Patient and family notified of of transfer: 10/11/22  Discharge Plan and Services Additional resources added to the After Visit Summary for                                       Social Determinants of Health (SDOH) Interventions SDOH Screenings   Housing: Patient Unable To Answer (10/02/2022)  Tobacco Use: High Risk (10/11/2022)     Readmission Risk Interventions     No data to display           Oletta Lamas, MSW, LCSWA, LCASA Transitions of Care  Clinical Social Worker I

## 2022-10-11 NOTE — Discharge Summary (Addendum)
Physician Discharge Summary  Patient ID: Brenda Riggs MRN: [id number] DOB/AGE: [birth year] 67 y.o.  Admit date: 09/28/2022 Discharge date: 10/11/2022  Admission Diagnoses:  Discharge Diagnoses:  Principal Problem:   Atrial fibrillation with RVR (HCC) Active Problems:   Type II diabetes mellitus (HCC)   Essential hypertension, benign   Asthma   Hypokalemia   History of stroke   Atrial fibrillation (HCC)   Malnutrition of moderate degree   Discharged Condition: stable  Hospital Course: Patient is a 67 year old African-American female with past medical history significant for asthma, diabetes mellitus type 2, hypertension, hyperlipidemia, ICH with residual dysarthria.  Patient presented on 09/28/2022 with worsening weakness and speech difficulty.  On presentation, patient was in atrial fibrillation with rapid ventricular response (heart rate in the 150s)  .MRI brain, limited study, was negative for acute intracranial abnormality.  Patient was admitted for further assessment and management.  Neurology team was consulted to assist with patient's care.  Neurology team suggested that her current worsening of symptoms may be likely secondary to recrudescence of old stroke symptoms in setting of underlying systemic infection versus possible small stroke in setting of A-fib. No anticoagulation was recommended due to extremely high risk of brain hemorrhage, in the presence of chronic microhemorrhages and prior ICH.  A-fib RVR was likely manage rate Cardizem drip.  Echo done on 09/29/2022 revealed moderate concentric LVH, estimated ejection fraction of 65 to 70%, no RWMA.  Hospital course was complicated by concerns for possible small bowel obstruction.  X-ray of the abdomen revealed multiple dilated loops of small bowel with decompressed colon possible.  General surgery team was consulted to direct management of possible small bowel obstruction.  Patient was managed conservatively.  Her symptoms have  resolved.  Due to significant dysphagia and aspiration risk, patient was kept n.p.o. and NG tube placed for tube feeds.  PEG tube was placed prior to discharge.  Palliative care team was consulted to assist in defining goals of care.  Patient is DO NOT RESUSCITATE.  Patient will be discharged back to the skilled nursing facility today.  Overall, prognosis remains very guarded.  Consider continuing palliative care follow-up.  Severe oral dysphagia Moderate malnutrition: -Recent MBS:08/20/20 a severe oral and mild pharyngeal dysphagia mostly c/b decreased oral control from significant weakness> dysphagia diet with thin liquids was recommended.MBS also in 2021> dysphagia 3 with thin liquid was recommended -Patient underwent modified barium swallow.  Patient was kept NPO.  Core track tube was placed for tube feeding.   -Prior to discharge, PEG tube has been placed.  Patient will be discharged on bolus tube feeds.     New onset PAF: -Converted to NSR. -No anticoagulation recommended due to risk of intracranial hemorrhage -Echo revealed normal EF.  TSH was stable.  -Cardiology team directed A-fib management.  Patient will follow cardiology team on discharge.     Worsening weakness and dysarthria  History of stroke: Seen by neurology MRI brain unremarkable felt to be a recrudescence of old stroke symptoms.  Continue her statin.   Hypokalemia/hypophosphatemia/hypomagnesemia: Resolved    Type 2 diabetes mellitus blood sugar is well controlled on SSI. Last Labs         Recent Labs  Lab 10/09/22 2020 10/10/22 0013 10/10/22 0506 10/10/22 0755 10/10/22 1037  GLUCAP 129* 156* 99 102* 102*       Leukocytosis resolved.   AKI: -Resolved.   -Suspect prerenal. (Patient had acute kidney kidney injury and not chronic kidney disease)   Hypertension:  -Reasonably controlled. -  Continue to optimize.      OSA continue nightly CPAP   WUJ:WJXB low stable b12,fa-completed IV iron x 5 days.   Hemoglobin Last Labs         Recent Labs  Lab 10/06/22 0623 10/07/22 0101 10/08/22 0055 10/09/22 0101 10/10/22 0046  HGB 9.0* 8.9* 9.9* 9.8* 10.1*  HCT 27.6* 28.2* 31.0* 30.9* 31.6*       Failure to thrive/deconditioning complex comorbidities with dysphagia stroke and malnutrition moderate -Consider continued palliative care follow-up on discharge. -Guarded prognosis.     Moderate malnutrition: -See above documentation. -PEG tube has been placed. -Continue bolus tube feeds.    Consults: Neurology, General Surgery, Palliative care and interventional Radiology.  Significant Diagnostic Studies:  CT abdomen without contrast revealed: 1. Gastric anatomy amenable to attempted percutaneous gastrostomy tube placement as indicated. 2. Punctate (3 mm) nonobstructing right-sided renal stone. 3. Coronary artery calcifications. Aortic Atherosclerosis  Modified barium swallow revealed: Clinical Impression: Pt demonstrates a severe oral dysphagia with very limited lingual function for bolus propulsion. When given teaspoons or straw sips pt has anterior spillage, lingual thrusting for propulsion with loss of bolus to the floor of mouth. Pt often spews liquid from mouth while attempting to manipulate. Pt uses a posterior head tilt to transit boluses. Thin, honey and puree all spill to pyriforms and airway prior to swallow initaition while pt still attempting to propel remainder of bolus. Aspiration is silent. Though pt does have involuntary movement of base of tongue and soft palate, at rest both appear flaccid and pt has snoring respirations. THere is a constant pulsing movement of all oropharyngeal musculature. Pharyngeal strength and ROM of motion quite good once swallow triggered. Unsure of etiology of impairment. There is a dramatic difference since last MBS in 2022. Unsure of potential for improvement, would expect limited ability to participate in therapy.  Abdominal x-ray reviewed: 1.  Feeding tube tip is likely in the distal stomach near the duodenal bulb. 2. Gaseous distention of bowel loops throughout the abdomen.  Chest x-ray revealed: Negative for active disease.    Procedures/treatments:  PEG tube placement by interventional radiology team.  Discharge Exam: Blood pressure (!) 160/73, pulse 77, temperature 98.1 F (36.7 C), temperature source Oral, resp. rate 17, height 4\' 9"  (1.448 m), weight 51.3 kg, SpO2 99 %.   Disposition: Discharge disposition: 03-Skilled Nursing Facility       Discharge Instructions     Amb referral to AFIB Clinic   Complete by: As directed    Increase activity slowly   Complete by: As directed       Allergies as of 10/11/2022       Reactions   Penicillins Other (See Comments)   Per caregiver. Unknown reaction Did it involve swelling of the face/tongue/throat, SOB, or low BP? Unknown Did it involve sudden or severe rash/hives, skin peeling, or any reaction on the inside of your mouth or nose? Unknown Did you need to seek medical attention at a hospital or doctor's office? Unknown When did it last happen?    unknown   If all above answers are "NO", may proceed with cephalosporin use.   Thioridazine Hcl Other (See Comments)   Present in Epic; not noted on MAR        Medication List     STOP taking these medications    alum & mag hydroxide-simeth 200-200-20 MG/5ML suspension Commonly known as: MAALOX/MYLANTA   divalproex 250 MG 24 hr tablet Commonly known as: DEPAKOTE ER   FLEET  ENEMA RE   loperamide 2 MG tablet Commonly known as: IMODIUM A-D   Milk of Magnesia 400 MG/5ML suspension Generic drug: magnesium hydroxide   PROSTAT PO       TAKE these medications    acetaminophen 325 MG tablet Commonly known as: TYLENOL Take 650 mg by mouth every 6 (six) hours as needed for headache (minor discomfort/fever up to 100 degrees orally).   albuterol 108 (90 Base) MCG/ACT inhaler Commonly known as: VENTOLIN  HFA Inhale 2 puffs into the lungs 4 (four) times daily as needed for wheezing or shortness of breath.   amLODipine 5 MG tablet Commonly known as: NORVASC Take 1 tablet (5 mg total) by mouth daily.   ascorbic acid 500 MG tablet Commonly known as: VITAMIN C Take 500 mg by mouth daily.   atorvastatin 40 MG tablet Commonly known as: LIPITOR Take 1 tablet (40 mg total) by mouth daily at 6 PM.   cholecalciferol 25 MCG (1000 UNIT) tablet Commonly known as: VITAMIN D3 Take 1,000 Units by mouth daily.   diclofenac Sodium 1 % Gel Commonly known as: VOLTAREN Apply 2 g topically 4 (four) times daily.   fluticasone 50 MCG/ACT nasal spray Commonly known as: FLONASE Place 1 spray into both nostrils daily.   guaifenesin 100 MG/5ML syrup Commonly known as: ROBITUSSIN Take 200 mg by mouth every 6 (six) hours as needed for cough.   iron polysaccharides 150 MG capsule Commonly known as: NIFEREX Place 1 capsule (150 mg total) into feeding tube daily. Start taking on: October 12, 2022   Isosource 1.5 Cal Liqd Place 250 mLs into feeding tube in the morning, at noon, in the evening, and at bedtime. What changed:  how much to take how to take this when to take this   metFORMIN 500 MG tablet Commonly known as: GLUCOPHAGE Take 500 mg by mouth every 12 (twelve) hours.   metoprolol tartrate 50 MG tablet Commonly known as: LOPRESSOR Take 1 tablet (50 mg total) by mouth 2 (two) times daily.   mouth rinse Liqd solution 15 mLs by Mouth Rinse route as needed (for oral care).   neomycin-bacitracin-polymyxin Oint Commonly known as: NEOSPORIN Apply 1 Application topically daily. Start taking on: October 12, 2022   pantoprazole 40 MG injection Commonly known as: PROTONIX Inject 40 mg into the vein daily.   Qvar RediHaler 40 MCG/ACT inhaler Generic drug: beclomethasone Inhale 2 puffs into the lungs 2 (two) times daily.   valproic acid 250 MG/5ML solution Commonly known as: DEPAKENE Place 2.5  mLs (125 mg total) into feeding tube in the morning and at bedtime.        Time spent: 40 minutes.  SignedBarnetta Chapel 10/11/2022, 10:46 AM

## 2022-10-11 NOTE — Plan of Care (Signed)
Patient discharging.

## 2022-10-11 NOTE — Progress Notes (Signed)
Patient picked up by EMS for transport to nursing facility.

## 2022-10-11 NOTE — Progress Notes (Addendum)
Nutrition Brief Note  PEG tube placed yesterday (6/10). MD requesting bolus feedings as pt is likely planned for discharge today. Orders placed and discussed with RN.   Recommend: 1 carton ( ) Jevity 1.5 QID  30ml free water flush before and after each bolus Additional 85ml free water flush q6h Provides 1420 kcal, 60g protein, free water daily  [place]le equivalent of Jevity 1.5 would be IsoSource 1.5 1 carton ( ) IsoSource 1.5 QID Provides 1500 kcal, 68g protein, free water  Drusilla Kanner, RDN, LDN Clinical Nutrition

## 2022-10-15 ENCOUNTER — Emergency Department (HOSPITAL_COMMUNITY)
Admission: EM | Admit: 2022-10-15 | Discharge: 2022-10-16 | Disposition: A | Discharge status: Home / Self Care | Payer: Medicare Other | Attending: Student | Admitting: Student

## 2022-10-15 ENCOUNTER — Other Ambulatory Visit: Payer: Self-pay

## 2022-10-15 DIAGNOSIS — Z79899 Other long term (current) drug therapy: Secondary | ICD-10-CM | POA: Insufficient documentation

## 2022-10-15 DIAGNOSIS — Z931 Gastrostomy status: Secondary | ICD-10-CM | POA: Diagnosis present

## 2022-10-15 DIAGNOSIS — E119 Type 2 diabetes mellitus without complications: Secondary | ICD-10-CM | POA: Insufficient documentation

## 2022-10-15 DIAGNOSIS — I10 Essential (primary) hypertension: Secondary | ICD-10-CM | POA: Diagnosis not present

## 2022-10-15 NOTE — ED Triage Notes (Signed)
Pt arrived from [facility], staff at SNF noted pt experiencing abd distension and leakage/blood around G-tube area. Pt denies pain to abd, denies n/v. SNF concern from Gtube blockage. Staff changed Gtube around 8pm tonight. Last BM earlier today. VSS per EMS, Oriented at baseline.

## 2022-10-15 NOTE — ED Provider Notes (Signed)
[place] EMERGENCY DEPARTMENT AT [facility] Provider Note   CSN: [id number] Arrival date & time: 10/15/22  2120     History {Add pertinent medical, surgical, social history, OB history to HPI:1} Chief Complaint  Patient presents with   G-Tube Issues    Brenda Riggs is a 67 y.o. female. She has PMHx of DM type 2,  HTN, HLD, Ich, afib, and recent G tube placement due to aspiration risk.  HPI     Home Medications Prior to Admission medications   Medication Sig Start Date End Date Taking? Authorizing Provider  acetaminophen (TYLENOL) 325 MG tablet Take 650 mg by mouth every 6 (six) hours as needed for headache (minor discomfort/fever up to 100 degrees orally).     [provider]  albuterol (PROVENTIL HFA;VENTOLIN HFA) 108 (90 BASE) MCG/ACT inhaler Inhale 2 puffs into the lungs 4 (four) times daily as needed for wheezing or shortness of breath.     [provider]  amLODipine (NORVASC) 5 MG tablet Take 1 tablet (5 mg total) by mouth daily. 03/26/20   Layne Benton, NP  atorvastatin (LIPITOR) 40 MG tablet Take 1 tablet (40 mg total) by mouth daily at 6 PM. 03/25/20   Layne Benton, NP  beclomethasone (QVAR REDIHALER) 40 MCG/ACT inhaler Inhale 2 puffs into the lungs 2 (two) times daily.    [provider]  cholecalciferol (VITAMIN D3) 25 MCG (1000 UNIT) tablet Take 1,000 Units by mouth daily.    [provider]  diclofenac Sodium (VOLTAREN) 1 % GEL Apply 2 g topically 4 (four) times daily. 10/11/22   Barnetta Chapel, MD  fluticasone (FLONASE) 50 MCG/ACT nasal spray Place 1 spray into both nostrils daily.    [provider]  guaifenesin (ROBITUSSIN) 100 MG/5ML syrup Take 200 mg by mouth every 6 (six) hours as needed for cough.    [provider]  iron polysaccharides (NIFEREX) 150 MG capsule Place 1 capsule (150 mg total) into feeding tube daily. 10/12/22   Berton Mount I, MD  metFORMIN (GLUCOPHAGE) 500 MG tablet  Take 500 mg by mouth every 12 (twelve) hours.     [provider]  metoprolol tartrate (LOPRESSOR) 50 MG tablet Take 1 tablet (50 mg total) by mouth 2 (two) times daily. 03/25/20   Layne Benton, NP  Mouthwashes (MOUTH RINSE) LIQD solution 15 mLs by Mouth Rinse route as needed (for oral care). 10/11/22   Barnetta Chapel, MD  neomycin-bacitracin-polymyxin (NEOSPORIN) OINT Apply 1 Application topically daily. 10/12/22   Barnetta Chapel, MD  Nutritional Supplements (ISOSOURCE 1.5 CAL) LIQD Place 250 mLs into feeding tube in the morning, at noon, in the evening, and at bedtime. 10/11/22   Berton Mount I, MD  pantoprazole (PROTONIX) 40 MG injection Inject 40 mg into the vein daily. 10/11/22   Barnetta Chapel, MD  valproic acid (DEPAKENE) 250 MG/5ML solution Place 2.5 mLs (125 mg total) into feeding tube in the morning and at bedtime. 10/11/22   Barnetta Chapel, MD  vitamin C (ASCORBIC ACID) 500 MG tablet Take 500 mg by mouth daily.    [provider]      Allergies    Penicillins and Thioridazine hcl    Review of Systems   Review of Systems  Physical Exam Updated Vital Signs BP (!) 129/55   Pulse 92   Temp 99.1 F (37.3 C)   Resp 20   SpO2 94%  Physical Exam  ED Results / Procedures /  Treatments   Labs (all labs ordered are listed, but only abnormal results are displayed) Labs Reviewed - No data to display  EKG None  Radiology No results found.  Procedures Procedures  {Document cardiac monitor, telemetry assessment procedure when appropriate:1}  Medications Ordered in ED Medications - No data to display  ED Course/ Medical Decision Making/ A&P   {   Click here for ABCD2, HEART and other calculatorsREFRESH Note before signing :1}                          Medical Decision Making Amount and/or Complexity of Data Reviewed Radiology: ordered.   ***  {Document critical care time when appropriate:1} {Document review of labs and clinical  decision tools ie heart score, Chads2Vasc2 etc:1}  {Document your independent review of radiology images, and any outside records:1} {Document your discussion with family members, caretakers, and with consultants:1} {Document social determinants of health affecting pt's care:1} {Document your decision making why or why not admission, treatments were needed:1} Final Clinical Impression(s) / ED Diagnoses Final diagnoses:  None    Rx / DC Orders ED Discharge Orders     None

## 2022-10-16 ENCOUNTER — Emergency Department (HOSPITAL_COMMUNITY): Payer: Medicare Other

## 2022-10-16 DIAGNOSIS — Z931 Gastrostomy status: Secondary | ICD-10-CM | POA: Diagnosis not present

## 2022-10-16 MED ORDER — DIATRIZOATE MEGLUMINE & SODIUM 66-10 % PO SOLN
30.0000 mL | Freq: Once | ORAL | Status: AC
  Administered 2022-10-16: 30 mL

## 2022-10-16 NOTE — ED Notes (Signed)
POA [place] Wonser [phone] wants an update immediately

## 2022-10-16 NOTE — Discharge Instructions (Signed)
Follow-up  with the GI doctor for your PEG tube.  It is in place here on x-ray, there is no sign of infection at this time.  Come back for new or worsening symptoms

## 2022-10-28 ENCOUNTER — Encounter: Payer: Self-pay | Admitting: Physician Assistant

## 2023-01-10 ENCOUNTER — Telehealth: Payer: Self-pay | Admitting: Physician Assistant

## 2023-01-10 ENCOUNTER — Ambulatory Visit: Payer: Medicare Other | Admitting: Physician Assistant

## 2023-01-10 NOTE — Telephone Encounter (Signed)
Good afternoon Brenda Riggs,  [name] called stating that patient would not be able to make her 2:30 appointment with you today. Did not give a reason why but stated their scheduler would call our office to get her rescheduled.

## 2023-04-13 ENCOUNTER — Other Ambulatory Visit: Payer: Self-pay

## 2023-04-13 ENCOUNTER — Ambulatory Visit: Payer: Medicare Other | Admitting: Physician Assistant

## 2023-04-13 ENCOUNTER — Other Ambulatory Visit: Payer: Medicare Other

## 2023-04-13 ENCOUNTER — Encounter: Payer: Self-pay | Admitting: Physician Assistant

## 2023-04-13 VITALS — BP 114/64 | HR 57

## 2023-04-13 DIAGNOSIS — I480 Paroxysmal atrial fibrillation: Secondary | ICD-10-CM

## 2023-04-13 DIAGNOSIS — D509 Iron deficiency anemia, unspecified: Secondary | ICD-10-CM

## 2023-04-13 DIAGNOSIS — I69322 Dysarthria following cerebral infarction: Secondary | ICD-10-CM | POA: Diagnosis not present

## 2023-04-13 DIAGNOSIS — I6930 Unspecified sequelae of cerebral infarction: Secondary | ICD-10-CM

## 2023-04-13 DIAGNOSIS — I69354 Hemiplegia and hemiparesis following cerebral infarction affecting left non-dominant side: Secondary | ICD-10-CM

## 2023-04-13 DIAGNOSIS — I69320 Aphasia following cerebral infarction: Secondary | ICD-10-CM

## 2023-04-13 DIAGNOSIS — E440 Moderate protein-calorie malnutrition: Secondary | ICD-10-CM

## 2023-04-13 DIAGNOSIS — I4891 Unspecified atrial fibrillation: Secondary | ICD-10-CM

## 2023-04-13 DIAGNOSIS — R1310 Dysphagia, unspecified: Secondary | ICD-10-CM

## 2023-04-13 LAB — COMPREHENSIVE METABOLIC PANEL
ALT: 23 U/L (ref 0–35)
AST: 22 U/L (ref 0–37)
Albumin: 3.6 g/dL (ref 3.5–5.2)
Alkaline Phosphatase: 61 U/L (ref 39–117)
BUN: 21 mg/dL (ref 6–23)
CO2: 32 meq/L (ref 19–32)
Calcium: 9.5 mg/dL (ref 8.4–10.5)
Chloride: 99 meq/L (ref 96–112)
Creatinine, Ser: 0.36 mg/dL — ABNORMAL LOW (ref 0.40–1.20)
GFR: 105.84 mL/min (ref >60.00)
Glucose, Bld: 86 mg/dL (ref 70–99)
Potassium: 3.9 meq/L (ref 3.5–5.1)
Sodium: 139 meq/L (ref 135–145)
Total Bilirubin: 0.4 mg/dL (ref 0.2–1.2)
Total Protein: 6.8 g/dL (ref 6.0–8.3)

## 2023-04-13 LAB — IBC + FERRITIN
Ferritin: 194.9 ng/mL (ref 10.0–291.0)
Iron: 73 ug/dL (ref 42–145)
Saturation Ratios: 26.3 % (ref 20.0–50.0)
TIBC: 277.2 ug/dL (ref 250.0–450.0)
Transferrin: 198 mg/dL — ABNORMAL LOW (ref 212.0–360.0)

## 2023-04-13 LAB — CBC WITH DIFFERENTIAL/PLATELET
Basophils Absolute: 0 10*3/uL (ref 0.0–0.1)
Basophils Relative: 0.3 % (ref 0.0–3.0)
Eosinophils Absolute: 0.2 10*3/uL (ref 0.0–0.7)
Eosinophils Relative: 3.1 % (ref 0.0–5.0)
HCT: 34.4 % — ABNORMAL LOW (ref 36.0–46.0)
Hemoglobin: 11.7 g/dL — ABNORMAL LOW (ref 12.0–15.0)
Lymphocytes Relative: 25.8 % (ref 12.0–46.0)
Lymphs Abs: 1.4 10*3/uL (ref 0.7–4.0)
MCHC: 33.9 g/dL (ref 30.0–36.0)
MCV: 97 fL (ref 78.0–100.0)
Monocytes Absolute: 0.5 10*3/uL (ref 0.1–1.0)
Monocytes Relative: 8.6 % (ref 3.0–12.0)
Neutro Abs: 3.4 10*3/uL (ref 1.4–7.7)
Neutrophils Relative %: 62.2 % (ref 43.0–77.0)
Platelets: 227 10*3/uL (ref 150.0–400.0)
RBC: 3.55 Mil/uL — ABNORMAL LOW (ref 3.87–5.11)
RDW: 14.1 % (ref 11.5–15.5)
WBC: 5.5 10*3/uL (ref 4.0–10.5)

## 2023-04-13 NOTE — Patient Instructions (Addendum)
Discussed with patient and wishes to not pursue colon/EGD for iron deficiency which is reasonable.  Will check iron If she has black stool or bright red stool please take to the hospital If she continues to have iron def despite iron replacement, can consider EGD/Colon in the hospital but she is VERY high risk.  Please go to the lab in the basement of our building to have lab work done as you leave today. Hit "B" for basement when you get on the elevator.  When the doors open the lab is on your left.  We will call you with the results. Thank you.  Thank you for entrusting me with your care and for choosing [facility], Quentin Mulling, P.A.-C   If your blood pressure at your visit was 140/90 or greater, please contact your primary care physician to follow up on this. ______________________________________________________  If you are age 66 or older, your body mass index should be between 23-30. Your There is no height or weight on file to calculate BMI. If this is out of the aforementioned range listed, please consider follow up with your Primary Care Provider.  If you are age 8 or younger, your body mass index should be between 19-25. Your There is no height or weight on file to calculate BMI. If this is out of the aformentioned range listed, please consider follow up with your Primary Care Provider.  ________________________________________________________  The [facility] providers would like to encourage you to use [facility] to communicate with providers for non-urgent requests or questions.  Due to long hold times on the telephone, sending your provider a message by [facility] may be a faster and more efficient way to get a response.  Please allow 48 business hours for a response.  Please remember that this is for non-urgent requests.  _______________________________________________________  Due to recent changes in healthcare laws, you may see the results of your imaging and  laboratory studies on MyChart before your provider has had a chance to review them.  We understand that in some cases there may be results that are confusing or concerning to you. Not all laboratory results come back in the same time frame and the provider may be waiting for multiple results in order to interpret others.  Please give [place] 48 hours in order for your provider to thoroughly review all the results before contacting the office for clarification of your results.

## 2023-04-13 NOTE — Progress Notes (Signed)
Agree with assessment and plan as outlined.  If she changes her mind or has overt bleeding we can consider, however appears to have chronic anemia, recent CT looks okay. I agree that risks may exceeds benefits, and she is currently declining procedures. Would monitor on IV iron .

## 2023-04-13 NOTE — Progress Notes (Addendum)
04/13/2023 Brenda Riggs [id number] Dec 03, 1955  Referring provider: [name], MD Primary GI doctor: Dr. Adela Riggs  ASSESSMENT AND PLAN:   67 year old female with history of ICH with residual dysarthria, type 2 diabetes, new onset PAF with recent hospitalization with worsening strokelike symptoms with aphasia, dysarthria, left sided weakness now resides in skilled nursing facility presents for IDA. Patient's had normocytic anemia since 2018, recent decline in hemoglobin from May to June with associated IDA iron 16, saturations 9, ferritin previously 185. Patient denies overt GI bleeding Patient has been on iron since hospitalization Will recheck iron, consider IV iron if is not improved At this point with multiple comorbidities, recent stroke with continuing aphasia, dysarthria, left-sided weakness patient is a very high risk for endoscopic evaluation, also discussed with the patient while she is not able to give complete answers she is able to say yes or no and she does not wants an endoscopy colonoscopy at this time. If patient has overt GI bleeding or changes her mind, will pursue endoscopic evaluation at the hospital.   Patient Care Team: Brenda Plum, MD as PCP - General (Internal Medicine)  HISTORY OF PRESENT ILLNESS: 67 y.o. female with a past medical history of asthma, type 2 diabetes, hypertension, hyperlipidemia, ICH with residual dysarthria, new onset PAF, moderate malnutrition and others listed below presents as a new patient for evaluation of IDA.   Per note review 2010 was rescheduled to see Dr. [name] at [facility] for screening colonoscopy Unable to see any colonoscopy or endoscopy reports. 09/28/2022 patient admitted to the hospital with weakness and speech difficulty, thought secondary to reoccurrence of previous CVA in setting of systemic infection versus small stroke in setting of A-fib.  No anticoagulation was recommended in setting of chronic  microhemorrhages and prior ICH. 09/29/2022 echocardiogram ejection fraction 65 to 70% no valve disease. Patient also developed small bowel obstruction during hospitalization, general surgery consulted, managed conservatively with NG tube placement and tube feedings. Patient was high risk for aspiration after MBS study suggested n.p.o. 10/11/2022 PEG tube placed by IR during this hospitalization. 10/15/2022 ER visit for drainage around tube placement some bloody drainage, gastrostomy tube study did not show any extravasation 10/07/2022 CT abdomen without contrast for PEG tube placement showed gastric anatomy amenable to attempted PEG tube, punctate 3 mm right renal stone, coronary artery cath locations and aortic atherosclerosis  Labs reviewed show: Patient's had a normocytic anemia since 2018 2011 Hgb 12.6, 2018 Hgb 10.8 with baseline around 11  2022  Hgb was 8.6 with associated leukocytosis and polycythemia associated with patient's hemorrhagic stroke Patient had improvement of Hgb in 2024 to 12-13 but had a continued drift in hemoglobin from May 2024 to June 2024 08/16/2020 iron 16, saturations 9, ferritin 185 10/01/2022 iron 14, saturation is 3, ferritin was not obtained She has had normal B12, folate, appropriate reticulocyte count  Patient is in padded chair, she has aphasia and dysarthria, unable to answer questions well other than yes or no but even with that some struggle.  No melena, no hematochezia.  No GERD. She is unable to answer about how often she has a bowel movement. May have some pain at the PEG tube site. No nausea, vomiting.  She has 3 kids and several siblings.  She has been on iron since the hospital via peg tube.  She has been on pantoprazole 20 mg BID.  She is on senna She is on promethazie/scolpalamine patch 12.5 mg for nausea  Onmorphine as needed.  Discussed the use of AI scribe software for clinical note transcription with the patient, who gave verbal consent to  proceed.   She  reports that she has been smoking cigarettes. She has a 4.1 pack-year smoking history. She has never used smokeless tobacco. She reports that she does not drink alcohol and does not use drugs.  RELEVANT LABS AND IMAGING:  CBC    Component Value Date/Time   WBC 9.3 10/10/2022 0046   RBC 3.22 (L) 10/10/2022 0046   HGB 10.1 (L) 10/10/2022 0046   HCT 31.6 (L) 10/10/2022 0046   PLT 328 10/10/2022 0046   MCV 98.1 10/10/2022 0046   MCH 31.4 10/10/2022 0046   MCHC 32.0 10/10/2022 0046   RDW 14.0 10/10/2022 0046   LYMPHSABS 2.0 09/28/2022 2337   MONOABS 0.9 09/28/2022 2337   EOSABS 0.0 09/28/2022 2337   BASOSABS 0.0 09/28/2022 2337   Recent Labs    10/01/22 0044 10/02/22 0038 10/03/22 0820 10/04/22 0740 10/05/22 4782 10/06/22 0623 10/07/22 0101 10/08/22 0055 10/09/22 0101 10/10/22 0046  HGB 10.4* 10.3* 9.8* 9.1* 8.9* 9.0* 8.9* 9.9* 9.8* 10.1*    CMP     Component Value Date/Time   NA 135 10/10/2022 0046   K 3.9 10/10/2022 0046   CL 102 10/10/2022 0046   CO2 26 10/10/2022 0046   GLUCOSE 139 (H) 10/10/2022 0046   BUN 12 10/10/2022 0046   CREATININE 0.52 10/10/2022 0046   CALCIUM 8.5 (L) 10/10/2022 0046   PROT 7.0 09/30/2022 0037   ALBUMIN 3.3 (L) 09/30/2022 0037   AST 25 09/30/2022 0037   ALT 19 09/30/2022 0037   ALKPHOS 65 09/30/2022 0037   BILITOT 0.7 09/30/2022 0037   GFRNONAA >60 10/10/2022 0046   GFRAA >60 07/08/2019 1947      Latest Ref Rng & Units 09/30/2022   12:37 AM 09/28/2022   11:37 PM 08/18/2020    5:18 AM  Hepatic Function  Total Protein 6.5 - 8.1 g/dL 7.0  7.7    Albumin 3.5 - 5.0 g/dL 3.3  3.8  2.2   AST 15 - 41 U/L 25  28    ALT 0 - 44 U/L 19  23    Alk Phosphatase 38 - 126 U/L 65  71    Total Bilirubin 0.3 - 1.2 mg/dL 0.7  0.6        Current Medications:   Current Outpatient Medications (Endocrine & Metabolic):    metFORMIN (GLUCOPHAGE) 500 MG tablet, Take 500 mg by mouth every 12 (twelve) hours.   Current Outpatient  Medications (Cardiovascular):    amLODipine (NORVASC) 5 MG tablet, Take 1 tablet (5 mg total) by mouth daily.   atorvastatin (LIPITOR) 40 MG tablet, Take 1 tablet (40 mg total) by mouth daily at 6 PM.   metoprolol tartrate (LOPRESSOR) 50 MG tablet, Take 1 tablet (50 mg total) by mouth 2 (two) times daily.  Current Outpatient Medications (Respiratory):    albuterol (PROVENTIL HFA;VENTOLIN HFA) 108 (90 BASE) MCG/ACT inhaler, Inhale 2 puffs into the lungs 4 (four) times daily as needed for wheezing or shortness of breath.    beclomethasone (QVAR REDIHALER) 40 MCG/ACT inhaler, Inhale 2 puffs into the lungs 2 (two) times daily.   fluticasone (FLONASE) 50 MCG/ACT nasal spray, Place 1 spray into both nostrils daily.   guaifenesin (ROBITUSSIN) 100 MG/5ML syrup, Take 200 mg by mouth every 6 (six) hours as needed for cough.   promethazine (PHENERGAN) 12.5 MG tablet, Take 12.5 mg by mouth every 6 (six) hours  as needed for nausea or vomiting (via g-tube).  Current Outpatient Medications (Analgesics):    acetaminophen (TYLENOL) 325 MG tablet, Take 650 mg by mouth every 6 (six) hours as needed for headache (minor discomfort/fever up to 100 degrees orally).    acetaminophen (TYLENOL) 650 MG suppository, Place 650 mg rectally every 6 (six) hours as needed.   Morphine Sulfate (MORPHINE CONCENTRATE) 10 mg / 0.5 ml concentrated solution, Take 20 mg by mouth every 4 (four) hours as needed for severe pain (pain score 7-10).  Current Outpatient Medications (Hematological):    ferrous sulfate 300 (60 Fe) MG/5ML syrup, Take 300 mg by mouth daily.   iron polysaccharides (NIFEREX) 150 MG capsule, Place 1 capsule (150 mg total) into feeding tube daily.  Current Outpatient Medications (Other):    antiseptic oral rinse (BIOTENE) LIQD, 15 mLs by Mouth Rinse route every 6 (six) hours.   atropine 1 % ophthalmic solution, Place 4 drops under the tongue 3 (three) times daily.   cholecalciferol (VITAMIN D3) 25 MCG (1000 UNIT)  tablet, Take 1,000 Units by mouth daily.   hyoscyamine (LEVSIN SL) 0.125 MG SL tablet, Place 0.125 mg under the tongue every 4 (four) hours as needed.   Mouthwashes (MOUTH RINSE) LIQD solution, 15 mLs by Mouth Rinse route as needed (for oral care).   Nutritional Supplements (ISOSOURCE 1.5 CAL) LIQD, Place 250 mLs into feeding tube in the morning, at noon, in the evening, and at bedtime.   pantoprazole (PROTONIX) 40 MG injection, Inject 40 mg into the vein daily.   scopolamine (TRANSDERM-SCOP) 1 MG/3DAYS, Place 1 patch onto the skin every 3 (three) days.   senna-docusate (SENOKOT-S) 8.6-50 MG tablet, Take 2 tablets by mouth at bedtime. Via G-tube.   valproic acid (DEPAKENE) 250 MG/5ML solution, Place 2.5 mLs (125 mg total) into feeding tube in the morning and at bedtime.   vitamin C (ASCORBIC ACID) 500 MG tablet, Take 500 mg by mouth daily.   diclofenac Sodium (VOLTAREN) 1 % GEL, Apply 2 g topically 4 (four) times daily. (Patient not taking: Reported on 04/13/2023)   neomycin-bacitracin-polymyxin (NEOSPORIN) OINT, Apply 1 Application topically daily. (Patient not taking: Reported on 04/13/2023)  Medical History:  Past Medical History:  Diagnosis Date   Allergy    Anxiety    Asthma    Atrial fibrillation with RVR (HCC) 09/29/2022   Blood transfusion without reported diagnosis    Chronic kidney disease    COPD (chronic obstructive pulmonary disease) (HCC)    Depression    Diabetes mellitus without complication (HCC)    History of hemorrhagic cerebrovascular accident (CVA) with residual deficit 08/15/2020   Hyperlipidemia    Hypertension    IDA (iron deficiency anemia)    MENTAL RETARDATION, MILD 11/02/2006   Qualifier: Diagnosis of   By: Barbaraann Barthel MD, [place]         Stroke [facility])    Vitamin D deficiency    Allergies:  Allergies  Allergen Reactions   Penicillins Other (See Comments)    Per caregiver. Unknown reaction Did it involve swelling of the face/tongue/throat, SOB, or low BP?  Unknown Did it involve sudden or severe rash/hives, skin peeling, or any reaction on the inside of your mouth or nose? Unknown Did you need to seek medical attention at a hospital or doctor's office? Unknown When did it last happen?    unknown   If all above answers are "NO", may proceed with cephalosporin use.   Thioridazine Hcl Other (See Comments)    Present in  Epic; not noted on [facility]     Surgical History:  She  has a past surgical history that includes Cesarean section; Abdominal hysterectomy; Tubal ligation; Appendectomy; and IR GASTROSTOMY TUBE MOD SED (10/10/2022). Family History:  Her family history includes Heart disease in her mother.  REVIEW OF SYSTEMS  : All other systems reviewed and negative except where noted in the History of Present Illness.  PHYSICAL EXAM: BP 114/64   Pulse (!) 57   SpO2 97%  General Appearance: Chronically ill-appearing, in large padded chair leaning on her left side which has decreased mobility Respiratory: Respiratory effort normal, BS equal bilaterally without rales, rhonchi, wheezing. Cardio: RRR with no MRGs. Peripheral pulses intact.  Abdomen: Soft,  Obese , hypoactive bowel sounds. No tenderness . Without guarding and Without rebound. No masses.  PEG tube left upper abdomen.  No evidence of drainage, warmth or erythema. Rectal: declines Musculoskeletal: Decreased range of motion bilateral upper arms, severely decreased left hand with some contractures, right hand still with limited mobility, bilateral legs no mobility, muscle wasting. Skin:  Dry and intact without significant lesions or rashes Neuro: Alert and  oriented but has severe dysarthria and aphasia, unable to answer questions other than with yes or no; tongue deviates to the right.  Severe muscle wasting, hemiparesis left-sided, mild right sided. Psych:  Cooperative. Normal mood and affect.    Doree Albee, PA-C 12:48 PM

## 2023-07-12 ENCOUNTER — Telehealth: Payer: Self-pay

## 2023-07-12 NOTE — Telephone Encounter (Signed)
-----   Message from Nurse Kerrie Buffalo sent at 04/13/2023  3:12 PM EST ----- Regarding: IBCF Pt needs labs, orders in epic.

## 2023-07-12 NOTE — Telephone Encounter (Signed)
 Left message for pt to come for labs, order in epic.

## 2023-12-28 ENCOUNTER — Telehealth: Payer: Self-pay

## 2024-01-10 NOTE — Telephone Encounter (Signed)
 error

## 2024-02-05 ENCOUNTER — Emergency Department (HOSPITAL_COMMUNITY)

## 2024-02-05 ENCOUNTER — Emergency Department (HOSPITAL_COMMUNITY)
Admission: EM | Admit: 2024-02-05 | Discharge: 2024-02-05 | Disposition: A | Discharge status: Skilled Nursing Facility | Attending: Emergency Medicine | Admitting: Emergency Medicine

## 2024-02-05 ENCOUNTER — Other Ambulatory Visit: Payer: Self-pay

## 2024-02-05 DIAGNOSIS — W06XXXA Fall from bed, initial encounter: Secondary | ICD-10-CM | POA: Insufficient documentation

## 2024-02-05 DIAGNOSIS — S40022A Contusion of left upper arm, initial encounter: Secondary | ICD-10-CM | POA: Insufficient documentation

## 2024-02-05 DIAGNOSIS — S0093XA Contusion of unspecified part of head, initial encounter: Secondary | ICD-10-CM | POA: Insufficient documentation

## 2024-02-05 DIAGNOSIS — Z8673 Personal history of transient ischemic attack (TIA), and cerebral infarction without residual deficits: Secondary | ICD-10-CM | POA: Insufficient documentation

## 2024-02-05 DIAGNOSIS — W19XXXA Unspecified fall, initial encounter: Secondary | ICD-10-CM

## 2024-02-05 DIAGNOSIS — D509 Iron deficiency anemia, unspecified: Secondary | ICD-10-CM

## 2024-02-05 DIAGNOSIS — S7002XA Contusion of left hip, initial encounter: Secondary | ICD-10-CM | POA: Insufficient documentation

## 2024-02-05 LAB — CBC WITH DIFFERENTIAL/PLATELET
Abs Immature Granulocytes: 0 K/uL (ref 0.00–0.07)
Basophils Absolute: 0 K/uL (ref 0.0–0.1)
Basophils Relative: 0 %
Eosinophils Absolute: 0.3 K/uL (ref 0.0–0.5)
Eosinophils Relative: 6 %
HCT: 34 % — ABNORMAL LOW (ref 36.0–46.0)
Hemoglobin: 11.1 g/dL — ABNORMAL LOW (ref 12.0–15.0)
Immature Granulocytes: 0 %
Lymphocytes Relative: 31 %
Lymphs Abs: 1.5 K/uL (ref 0.7–4.0)
MCH: 31.9 pg (ref 26.0–34.0)
MCHC: 32.6 g/dL (ref 30.0–36.0)
MCV: 97.7 fL (ref 80.0–100.0)
Monocytes Absolute: 0.6 K/uL (ref 0.1–1.0)
Monocytes Relative: 12 %
Neutro Abs: 2.5 K/uL (ref 1.7–7.7)
Neutrophils Relative %: 51 %
Platelets: 222 K/uL (ref 150–400)
RBC: 3.48 MIL/uL — ABNORMAL LOW (ref 3.87–5.11)
RDW: 14.2 % (ref 11.5–15.5)
WBC: 5 K/uL (ref 4.0–10.5)
nRBC: 0 % (ref 0.0–0.2)

## 2024-02-05 LAB — COMPREHENSIVE METABOLIC PANEL WITH GFR
ALT: 17 U/L (ref 0–44)
AST: 25 U/L (ref 15–41)
Albumin: 2.8 g/dL — ABNORMAL LOW (ref 3.5–5.0)
Alkaline Phosphatase: 58 U/L (ref 38–126)
Anion gap: 13 (ref 5–15)
BUN: 25 mg/dL — ABNORMAL HIGH (ref 8–23)
CO2: 27 mmol/L (ref 22–32)
Calcium: 9.4 mg/dL (ref 8.9–10.3)
Chloride: 98 mmol/L (ref 98–111)
Creatinine, Ser: 0.42 mg/dL — ABNORMAL LOW (ref 0.44–1.00)
GFR, Estimated: >60 mL/min (ref >60)
Glucose, Bld: 87 mg/dL (ref 70–99)
Potassium: 4.3 mmol/L (ref 3.5–5.1)
Sodium: 138 mmol/L (ref 135–145)
Total Bilirubin: 0.6 mg/dL (ref 0.0–1.2)
Total Protein: 6.6 g/dL (ref 6.5–8.1)

## 2024-02-05 NOTE — ED Provider Notes (Signed)
 [place] EMERGENCY DEPARTMENT AT [facility] Provider Note   CSN: [id number] Arrival date & time: 02/05/24  9386     Patient presents with: [place]   Brenda Riggs is a 68 y.o. female.   Patient brought to the emergency department by EMS.  Patient is a resident at [facility].  Patient is reported to have fallen out of her bed.  EMS reported patient's bed is approximately a foot off the ground.  Fall was unwitnessed.  Staff reported that patient is at her baseline.  Patient has a past medical history of CVA.  Patient was screened prior to me and had x-rays ordered of her pelvis, left humerus and CT of her head.  Patient seems to have pain with attempting to move her lower extremities, legs are contracted.  Patient is responsive, makes eye contact and speaks but does not form any intelligible words.  The history is provided by the patient. No language interpreter was used.  Fall       Prior to Admission medications   Medication Sig Start Date End Date Taking? Authorizing Provider  acetaminophen  (TYLENOL ) 325 MG tablet Place 650 mg into feeding tube every 6 (six) hours as needed for fever or mild pain (pain score 1-3) (minor discomfort/fever up to 100 degrees orally).   Yes [provider]  acetaminophen  (TYLENOL ) 650 MG suppository Place 650 mg rectally every 6 (six) hours as needed.   Yes [provider]  albuterol  (PROVENTIL  HFA;VENTOLIN  HFA) 108 (90 BASE) MCG/ACT inhaler Inhale 2 puffs into the lungs 4 (four) times daily as needed for wheezing or shortness of breath.    Yes [provider]  amLODipine  (NORVASC ) 5 MG tablet Take 1 tablet (5 mg total) by mouth daily. Patient taking differently: Place 5 mg into feeding tube daily. Give one tablet via tube one time a day 03/26/20  Yes Biby, Reena CROME, NP  antiseptic oral rinse (BIOTENE) LIQD 15 mLs by Mouth Rinse route every 12 (twelve) hours as needed for dry mouth. Give 15ml orally  every 12 hours as needed fro dry mouth. Use oral swab and apply lightly to oral cavity.   Yes [provider]  atropine 1 % ophthalmic solution Place 4 drops under the tongue every 6 (six) hours as needed (excessive oral secretions). 03/19/23  Yes [provider]  ferrous sulfate 300 (60 Fe) MG/5ML syrup Place 300 mg into feeding tube daily.   Yes [provider]  fluticasone  (FLONASE ) 50 MCG/ACT nasal spray Place 1 spray into both nostrils daily.   Yes [provider]  guaifenesin  (ROBITUSSIN) 100 MG/5ML syrup Place 200 mg into feeding tube every 6 (six) hours as needed for cough.   Yes [provider]  hyoscyamine (LEVSIN SL) 0.125 MG SL tablet Place 0.125 mg under the tongue every 4 (four) hours as needed (excessive secretions). 03/19/23  Yes [provider]  ipratropium-albuterol  (DUONEB) 0.5-2.5 (3) MG/3ML SOLN Inhale 3 mLs into the lungs every 4 (four) hours as needed (SOB/COPD).   Yes [provider]  liver oil-zinc oxide (DESITIN) 40 % ointment Apply 1 Application topically as needed for irritation. Apply to buttocks and sacrum topically everyday and night shift for incontinence care.   Yes [provider]  metFORMIN (GLUCOPHAGE) 500 MG tablet Place 500 mg into feeding tube 2 (two) times daily with a meal. Give 1 tablet by PEG tube two times a day.   Yes [provider]  metoprolol  tartrate (LOPRESSOR )  50 MG tablet Take 1 tablet (50 mg total) by mouth 2 (two) times daily. Patient taking differently: Place 50 mg into feeding tube 2 (two) times daily. 03/25/20  Yes Noemi Reena CROME, NP  morphine (ROXANOL) 20 MG/ML concentrated solution Place 0.25 mLs into feeding tube every 4 (four) hours as needed for severe pain (pain score 7-10) or moderate pain (pain score 4-6).   Yes [provider]  promethazine (PHENERGAN) 12.5 MG tablet Place 12.5 mg into feeding tube every 6 (six) hours as needed for nausea or vomiting  (via g-tube).   Yes [provider]  scopolamine (TRANSDERM-SCOP) 1 MG/3DAYS Place 1 patch onto the skin every 3 (three) days. 04/01/23  Yes [provider]  senna-docusate (SENOKOT-S) 8.6-50 MG tablet Take 2 tablets by mouth at bedtime. Via G-tube.   Yes [provider]  valproic  acid (DEPAKENE ) 250 MG/5ML solution Place 2.5 mLs (125 mg total) into feeding tube in the morning and at bedtime. 10/11/22  Yes Rosario Leatrice FERNS, MD    Allergies: Thioridazine hcl and Penicillins    Review of Systems  All other systems reviewed and are negative.   Updated Vital Signs BP 117/69 (BP Location: Right Arm)   Pulse 63   Temp (!) 97.4 F (36.3 C) (Oral)   Resp (!) 22   SpO2 100%   Physical Exam Vitals and nursing note reviewed.  Constitutional:      Appearance: She is well-developed.  HENT:     Head: Normocephalic.     Mouth/Throat:     Mouth: Mucous membranes are moist.  Eyes:     Pupils: Pupils are equal, round, and reactive to light.  Cardiovascular:     Rate and Rhythm: Normal rate and regular rhythm.     Pulses: Normal pulses.  Pulmonary:     Effort: Pulmonary effort is normal.  Abdominal:     General: There is no distension.  Musculoskeletal:        General: Normal range of motion.     Cervical back: Normal range of motion.  Skin:    General: Skin is warm.  Neurological:     General: No focal deficit present.     Mental Status: She is alert and oriented to person, place, and time.  Psychiatric:        Mood and Affect: Mood normal.     (all labs ordered are listed, but only abnormal results are displayed) Labs Reviewed  CBC WITH DIFFERENTIAL/PLATELET - Abnormal; Notable for the following components:      Result Value   RBC 3.48 (*)    Hemoglobin 11.1 (*)    HCT 34.0 (*)    All other components within normal limits  COMPREHENSIVE METABOLIC PANEL WITH GFR - Abnormal; Notable for the following components:   BUN 25 (*)    Creatinine, Ser  0.42 (*)    Albumin 2.8 (*)    All other components within normal limits    EKG: None  Radiology: CT Hip Left Wo Contrast Result Date: 02/05/2024 EXAM: CT OF THE LEFT HIP WITHOUT IV CONTRAST 02/05/2024 09:14:20 AM TECHNIQUE: CT of the left hip was performed without the administration of intravenous contrast. Multiplanar reformatted images are provided for review. Automated exposure control, iterative reconstruction, and/or weight based adjustment of the mA/kV was utilized to reduce the radiation dose to as low as reasonably achievable. Imaging was performed with the left hip flexed and adducted, presumably the patient was unable to assume normal positioning. COMPARISON: Radiographs 02/05/2024  CLINICAL HISTORY: Hip trauma, fracture suspected. Patient fell out of bed. 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 No thinners. FINDINGS: BONES: I don't observe a definite fracture. If there is a high clinical suspicion of occult fracture, MRI could be used for further assessment. No aggressive appearing osseous abnormality or periostitis. SOFT TISSUE: Regional muscular atrophy. Potentially some heterotopic ossification extending in the proximal sartorius muscle. No significant soft tissue edema or fluid collections. No soft tissue mass. JOINT: Notable spurring of the left acetabulum and of the left ischium at the hamstring origination site. There is also spurring along the anterior inferior iliac spine. Moderate chondral thinning in the hip joint. Moderate degenerative arthropathy of the sacroiliac joints bilaterally. No osseous erosions. INTRAPELVIC CONTENTS: Atherosclerosis noted. Limited images of the intrapelvic contents are unremarkable. IMPRESSION: 1. No definite fracture identified. If high suspicion for occult fracture, MRI can be considered. 2. The hip is imaged in substantial flexion and adduction presumably, the patient was not able to assume standard positioning. This reduces diagnostic sensitivity  and specificity. 3. Moderate degenerative arthropathy of the sacroiliac joints bilaterally. 4. Moderate chondral thinning of the left hip joint. 5. Left acetabular spurring. 6. Spurring at the left ischial tuberosity (hamstring origin). 7. Spurring along the anterior inferior iliac spine with possible proximal sartorius heterotopic ossification. 8. Atherosclerosis. 9. Regional muscular atrophy. Electronically signed by: Ryan Salvage MD 02/05/2024 10:47 AM EDT RP Workstation: [id number]   DG Humerus Left Result Date: 02/05/2024 EXAM: 2 VIEW(S) XRAY OF THE LEFT HUMERUS 02/05/2024 07:08:00 AM COMPARISON: None available. CLINICAL HISTORY: fall. Fall out of bed,.pt non ambulatory. Lefty shoulder pain? FINDINGS: BONES AND JOINTS: No acute fracture. No focal osseous lesion. Moderate degenerative changes of the shoulder joint with moderate to large inferior glenoid osteophyte formation. SOFT TISSUES: The soft tissues are unremarkable. IMPRESSION: 1. No acute fracture or dislocation. Electronically signed by: Waddell Calk MD 02/05/2024 07:27 AM EDT RP Workstation: [id number]   DG Pelvis 1-2 Views Result Date: 02/05/2024 EXAM: 1 or 2 VIEW(S) XRAY OF THE PELVIS 02/05/2024 07:08:00 AM COMPARISON: None available. CLINICAL HISTORY: Fall out of bed,pt non ambulatory.contracted FINDINGS: BONES AND JOINTS: Exam detail was diminished due to suboptimal patient positioning. Within this limitation, there is foreshortening of the left femoral neck. Cannot exclude underlying fracture of the left femoral neck based upon the current exam. Degenerative changes of the lower lumbar spine. No focal osseous lesion. No joint dislocation. SOFT TISSUES: The soft tissues are unremarkable. IMPRESSION: 1. Exam detail limited by suboptimal positioning. 2. Foreshortening of the left femoral neck; underlying femoral neck fracture cannot be excluded on this exam. In the setting of suspected left hip fracture, more definitive characterization  may be obtained with CT of the pelvis. Electronically signed by: Waddell Calk MD 02/05/2024 07:26 AM EDT RP Workstation: [id number]   DG Chest 1 View Result Date: 02/05/2024 EXAM: 1 VIEW XRAY OF THE CHEST 02/05/2024 07:08:00 AM COMPARISON: 10/10/2022 CLINICAL HISTORY: Fall out of bed,pt non ambulatory FINDINGS: LINES, TUBES AND DEVICES: Percutaneous gastrostomy tube in left upper quadrant. LUNGS AND PLEURA: Low lung volumes. No focal pulmonary opacity. No pulmonary edema. No pleural effusion. No pneumothorax. HEART AND MEDIASTINUM: Aortic arch calcifications. No acute abnormality of the cardiac and mediastinal silhouettes. BONES AND SOFT TISSUES: No acute osseous abnormality. IMPRESSION: 1. No acute cardiopulmonary process. Electronically signed by: Waddell Calk MD 02/05/2024 07:21 AM EDT RP Workstation: [id number]   CT HEAD WO CONTRAST ( ) Result Date: 02/05/2024 EXAM: CT HEAD WITHOUT CONTRAST 02/05/2024 06:54:47 AM TECHNIQUE: CT  of the head was performed without the administration of intravenous contrast. Automated exposure control, iterative reconstruction, and/or weight based adjustment of the mA/kV was utilized to reduce the radiation dose to as low as reasonably achievable. COMPARISON: 09/28/2022 CLINICAL HISTORY: fall at SNF. FINDINGS: BRAIN AND VENTRICLES: Prominence of the sulci and ventricles compatible with brain atrophy. Hypoattenuating foci in the cerebral white matter, most likely representing chronic small vessel disease. Progressive changes of chronic lacunar infarct within the left centrum semiovale. Similar appearance of chronic bilateral thalamic lacunar infarcts. No acute hemorrhage. No extra-axial collection. No mass effect or midline shift. ORBITS: No acute abnormality. SINUSES: No acute abnormality. SOFT TISSUES AND SKULL: No acute soft tissue abnormality. No skull fracture. IMPRESSION: 1. No acute intracranial abnormality related to the fall. 2. Brain atrophy and chronic small  vessel ischemic disease. Electronically signed by: Waddell Calk MD 02/05/2024 07:02 AM EDT RP Workstation: [id number]     Procedures   Medications Ordered in the ED - No data to display                                  Medical Decision Making Pt brought in by EMS after a fall at the facility where she resides.    Amount and/or Complexity of Data Reviewed Independent Historian: EMS    Details: Ems was called.  Pt found on the floor  Labs: ordered. Decision-making details documented in ED Course.    Details: Labs ordered reviewed and interpreted  Radiology: ordered and independent interpretation performed. Decision-making details documented in ED Course.    Details: Ct head no acute intracranial abnormality Pelvis xray  shows possible fracture, radiologist advised ct scan.  Ct scan left femur, no fracture  Left humerus  no fracture         Final diagnoses:  Fall, initial encounter  Contusion of head, unspecified part of head, initial encounter  Contusion of left hip, initial encounter  Contusion of left upper extremity, initial encounter    ED Discharge Orders     None       An After Visit Summary was printed and given to the patient.    Flint Sonny POUR, PA-C 02/05/24 1445    Emil Share, DO 02/05/24 1521

## 2024-02-05 NOTE — ED Notes (Signed)
 Patient transported to CT

## 2024-02-05 NOTE — ED Notes (Signed)
 Called the facility [facility] and gave report on the patient to tiffany. They are awaiting arrival from [facility].

## 2024-02-05 NOTE — ED Triage Notes (Signed)
 PT BIB EMS from [facility]. EMS reports pt fell out of bed, reports bed was approximately 1 foot above ground. Pt is Facility staff reported pt is at baseline and has hx of slurred speech from prior stroke. No thinners. Pt reports she hit her head, but denies any pain to head. Endorses L shoulder pain. NPO and has gtube.   EMS reports pt is DNR but facility was unable to find form.   EMS VS 110/54, HR 68, 99% RA, cbg 121

## 2024-02-05 NOTE — ED Notes (Signed)
 Patient dc by previous [employer] [facility]. Paperwork given to PTAR. Patient contact notified by previous RN and at bedside prior to transfer.

## 2024-02-05 NOTE — ED Notes (Signed)
 Spoke to the daughter. She had no concerns or questions at this time and is okay with PTAR back to the facility.

## 2024-02-05 NOTE — Discharge Instructions (Addendum)
Tylenol for pain.  Return if any problems.
